# Patient Record
Sex: Female | Born: 1975
Health system: Southern US, Community
[De-identification: ages and names within clinical notes are randomized; demographics above are authoritative.]

## PROBLEM LIST (undated history)

## (undated) DIAGNOSIS — F909 Attention-deficit hyperactivity disorder, unspecified type: Secondary | ICD-10-CM

## (undated) DIAGNOSIS — J45909 Unspecified asthma, uncomplicated: Secondary | ICD-10-CM

## (undated) DIAGNOSIS — K219 Gastro-esophageal reflux disease without esophagitis: Secondary | ICD-10-CM

## (undated) DIAGNOSIS — F319 Bipolar disorder, unspecified: Secondary | ICD-10-CM

## (undated) HISTORY — PX: HERNIA REPAIR: SHX51

## (undated) HISTORY — DX: Attention-deficit hyperactivity disorder, unspecified type: F90.9

## (undated) HISTORY — PX: BACK SURGERY: SHX140

## (undated) HISTORY — PX: ABDOMINAL SURGERY: SHX537

## (undated) HISTORY — PX: INCONTINENCE SURGERY: SHX676

## (undated) HISTORY — PX: OTHER SURGICAL HISTORY: SHX169

## (undated) HISTORY — PX: TUBAL LIGATION: SHX77

## (undated) HISTORY — PX: ABDOMINAL HYSTERECTOMY: SHX81

---

## 2005-04-13 ENCOUNTER — Ambulatory Visit: Payer: Self-pay | Admitting: Unknown Physician Specialty

## 2005-06-04 ENCOUNTER — Emergency Department: Payer: Self-pay | Admitting: Emergency Medicine

## 2005-06-08 ENCOUNTER — Ambulatory Visit: Payer: Self-pay | Admitting: Unknown Physician Specialty

## 2005-09-22 ENCOUNTER — Ambulatory Visit: Payer: Self-pay | Admitting: Otolaryngology

## 2005-12-03 ENCOUNTER — Emergency Department: Payer: Self-pay | Admitting: Emergency Medicine

## 2005-12-22 ENCOUNTER — Ambulatory Visit: Payer: Self-pay | Admitting: Surgery

## 2006-10-23 ENCOUNTER — Emergency Department: Payer: Self-pay | Admitting: Emergency Medicine

## 2007-05-20 ENCOUNTER — Emergency Department: Payer: Self-pay | Admitting: Emergency Medicine

## 2007-09-16 ENCOUNTER — Ambulatory Visit: Payer: Self-pay | Admitting: Unknown Physician Specialty

## 2008-06-03 ENCOUNTER — Ambulatory Visit: Payer: Self-pay | Admitting: Family Medicine

## 2009-02-04 ENCOUNTER — Ambulatory Visit: Payer: Self-pay | Admitting: Internal Medicine

## 2009-05-21 ENCOUNTER — Ambulatory Visit: Payer: Self-pay | Admitting: Unknown Physician Specialty

## 2009-05-26 ENCOUNTER — Ambulatory Visit: Payer: Self-pay | Admitting: Unknown Physician Specialty

## 2009-05-28 ENCOUNTER — Observation Stay: Payer: Self-pay | Admitting: Internal Medicine

## 2009-07-09 ENCOUNTER — Ambulatory Visit: Payer: Self-pay | Admitting: Unknown Physician Specialty

## 2009-08-03 ENCOUNTER — Emergency Department: Payer: Self-pay | Admitting: Emergency Medicine

## 2009-08-15 ENCOUNTER — Ambulatory Visit: Payer: Self-pay | Admitting: Internal Medicine

## 2009-09-26 ENCOUNTER — Emergency Department: Payer: Self-pay | Admitting: Internal Medicine

## 2009-10-31 ENCOUNTER — Emergency Department: Payer: Self-pay | Admitting: Emergency Medicine

## 2009-11-04 ENCOUNTER — Ambulatory Visit: Payer: Self-pay | Admitting: Unknown Physician Specialty

## 2009-12-13 ENCOUNTER — Emergency Department: Payer: Self-pay | Admitting: Emergency Medicine

## 2010-01-09 ENCOUNTER — Emergency Department: Payer: Self-pay | Admitting: Emergency Medicine

## 2010-02-02 ENCOUNTER — Emergency Department: Payer: Self-pay | Admitting: Emergency Medicine

## 2010-04-12 ENCOUNTER — Observation Stay: Payer: Self-pay | Admitting: Internal Medicine

## 2010-06-18 ENCOUNTER — Emergency Department: Payer: Self-pay | Admitting: Emergency Medicine

## 2010-07-25 ENCOUNTER — Emergency Department: Payer: Self-pay | Admitting: Emergency Medicine

## 2010-07-27 ENCOUNTER — Ambulatory Visit: Payer: Self-pay | Admitting: Unknown Physician Specialty

## 2010-08-21 ENCOUNTER — Ambulatory Visit: Payer: Self-pay | Admitting: Internal Medicine

## 2010-10-25 ENCOUNTER — Emergency Department: Payer: Self-pay | Admitting: Emergency Medicine

## 2010-10-29 ENCOUNTER — Emergency Department: Payer: Self-pay | Admitting: Emergency Medicine

## 2010-11-01 ENCOUNTER — Ambulatory Visit: Payer: Self-pay | Admitting: Neurosurgery

## 2010-11-08 ENCOUNTER — Ambulatory Visit: Payer: Self-pay | Admitting: Internal Medicine

## 2011-04-26 ENCOUNTER — Emergency Department: Payer: Self-pay | Admitting: Emergency Medicine

## 2011-06-14 ENCOUNTER — Ambulatory Visit: Payer: Self-pay | Admitting: Family Medicine

## 2011-07-04 ENCOUNTER — Emergency Department: Payer: Self-pay | Admitting: Emergency Medicine

## 2011-08-27 ENCOUNTER — Emergency Department: Payer: Self-pay | Admitting: *Deleted

## 2011-10-07 ENCOUNTER — Emergency Department: Payer: Self-pay | Admitting: Internal Medicine

## 2011-10-07 LAB — URINALYSIS, COMPLETE
Blood: NEGATIVE
Ph: 5 (ref 4.5–8.0)
Protein: 100
Specific Gravity: 1.031 (ref 1.003–1.030)

## 2011-10-07 LAB — BASIC METABOLIC PANEL
Anion Gap: 7 (ref 7–16)
BUN: 8 mg/dL (ref 7–18)
Chloride: 104 mmol/L (ref 98–107)
Creatinine: 0.63 mg/dL (ref 0.60–1.30)
EGFR (Non-African Amer.): >60
Glucose: 85 mg/dL (ref 65–99)
Potassium: 3.3 mmol/L — ABNORMAL LOW (ref 3.5–5.1)

## 2011-10-07 LAB — CBC WITH DIFFERENTIAL/PLATELET
Basophil %: 0.6 %
Eosinophil #: 0.5 10*3/uL (ref 0.0–0.7)
HCT: 33.3 % — ABNORMAL LOW (ref 35.0–47.0)
HGB: 11.3 g/dL — ABNORMAL LOW (ref 12.0–16.0)
MCH: 28.8 pg (ref 26.0–34.0)
MCHC: 33.8 g/dL (ref 32.0–36.0)
Monocyte #: 0.4 10*3/uL (ref 0.0–0.7)
Neutrophil #: 3.6 10*3/uL (ref 1.4–6.5)

## 2011-10-15 ENCOUNTER — Emergency Department: Payer: Self-pay | Admitting: Emergency Medicine

## 2011-10-15 LAB — CBC
MCH: 28.7 pg (ref 26.0–34.0)
MCHC: 33.6 g/dL (ref 32.0–36.0)
MCV: 85 fL (ref 80–100)
Platelet: 324 10*3/uL (ref 150–440)
RBC: 4.24 10*6/uL (ref 3.80–5.20)
RDW: 15.4 % — ABNORMAL HIGH (ref 11.5–14.5)
WBC: 9.5 10*3/uL (ref 3.6–11.0)

## 2011-10-15 LAB — COMPREHENSIVE METABOLIC PANEL
Anion Gap: 9 (ref 7–16)
Bilirubin,Total: 0.1 mg/dL — ABNORMAL LOW (ref 0.2–1.0)
Chloride: 108 mmol/L — ABNORMAL HIGH (ref 98–107)
Co2: 25 mmol/L (ref 21–32)
Creatinine: 0.77 mg/dL (ref 0.60–1.30)
EGFR (African American): >60
EGFR (Non-African Amer.): >60
Osmolality: 281 (ref 275–301)
Potassium: 4.2 mmol/L (ref 3.5–5.1)
Sodium: 142 mmol/L (ref 136–145)
Total Protein: 7.2 g/dL (ref 6.4–8.2)

## 2011-10-15 LAB — URINALYSIS, COMPLETE
Bilirubin,UR: NEGATIVE
Glucose,UR: NEGATIVE mg/dL (ref 0–75)
Ketone: NEGATIVE
Protein: NEGATIVE
RBC,UR: 5 /HPF (ref 0–5)
WBC UR: 374 /HPF (ref 0–5)

## 2011-10-17 LAB — URINE CULTURE

## 2011-11-13 ENCOUNTER — Emergency Department: Payer: Self-pay | Admitting: Emergency Medicine

## 2011-11-13 LAB — CBC
HGB: 13.6 g/dL (ref 12.0–16.0)
MCH: 29 pg (ref 26.0–34.0)
MCHC: 34.1 g/dL (ref 32.0–36.0)
RDW: 16.2 % — ABNORMAL HIGH (ref 11.5–14.5)

## 2011-11-13 LAB — BASIC METABOLIC PANEL
Calcium, Total: 9.2 mg/dL (ref 8.5–10.1)
EGFR (African American): >60
EGFR (Non-African Amer.): >60
Glucose: 118 mg/dL — ABNORMAL HIGH (ref 65–99)
Osmolality: 288 (ref 275–301)
Sodium: 145 mmol/L (ref 136–145)

## 2011-11-13 LAB — URINALYSIS, COMPLETE
Leukocyte Esterase: NEGATIVE
Nitrite: NEGATIVE
Ph: 6 (ref 4.5–8.0)
RBC,UR: 12 /HPF (ref 0–5)
Squamous Epithelial: 1

## 2011-11-15 LAB — URINE CULTURE

## 2012-02-27 ENCOUNTER — Emergency Department: Payer: Self-pay | Admitting: Emergency Medicine

## 2012-02-27 LAB — COMPREHENSIVE METABOLIC PANEL
Bilirubin,Total: 0.2 mg/dL (ref 0.2–1.0)
Chloride: 106 mmol/L (ref 98–107)
EGFR (African American): >60
Osmolality: 277 (ref 275–301)
SGOT(AST): 12 U/L — ABNORMAL LOW (ref 15–37)
SGPT (ALT): 21 U/L
Sodium: 139 mmol/L (ref 136–145)

## 2012-02-27 LAB — URINALYSIS, COMPLETE
Bilirubin,UR: NEGATIVE
Blood: NEGATIVE
Hyaline Cast: 1
Leukocyte Esterase: NEGATIVE
Nitrite: NEGATIVE
Ph: 5 (ref 4.5–8.0)
Protein: 100
RBC,UR: 4 /HPF (ref 0–5)
Squamous Epithelial: 1

## 2012-02-27 LAB — CBC
HGB: 12.7 g/dL (ref 12.0–16.0)
MCH: 28.9 pg (ref 26.0–34.0)
MCHC: 33 g/dL (ref 32.0–36.0)
RDW: 14.2 % (ref 11.5–14.5)
WBC: 8.8 10*3/uL (ref 3.6–11.0)

## 2012-02-27 LAB — LIPASE, BLOOD: Lipase: 173 U/L (ref 73–393)

## 2012-02-28 ENCOUNTER — Encounter (HOSPITAL_COMMUNITY): Payer: Self-pay | Admitting: Emergency Medicine

## 2012-02-28 ENCOUNTER — Emergency Department (HOSPITAL_COMMUNITY)
Admission: EM | Admit: 2012-02-28 | Discharge: 2012-02-29 | Disposition: A | Discharge status: Home / Self Care | Payer: BC Managed Care – PPO | Attending: Emergency Medicine | Admitting: Emergency Medicine

## 2012-02-28 DIAGNOSIS — J45909 Unspecified asthma, uncomplicated: Secondary | ICD-10-CM | POA: Insufficient documentation

## 2012-02-28 DIAGNOSIS — F319 Bipolar disorder, unspecified: Secondary | ICD-10-CM | POA: Insufficient documentation

## 2012-02-28 DIAGNOSIS — K219 Gastro-esophageal reflux disease without esophagitis: Secondary | ICD-10-CM | POA: Insufficient documentation

## 2012-02-28 DIAGNOSIS — R1011 Right upper quadrant pain: Secondary | ICD-10-CM | POA: Insufficient documentation

## 2012-02-28 DIAGNOSIS — Z79899 Other long term (current) drug therapy: Secondary | ICD-10-CM | POA: Insufficient documentation

## 2012-02-28 DIAGNOSIS — E119 Type 2 diabetes mellitus without complications: Secondary | ICD-10-CM | POA: Insufficient documentation

## 2012-02-28 HISTORY — DX: Bipolar disorder, unspecified: F31.9

## 2012-02-28 HISTORY — DX: Gastro-esophageal reflux disease without esophagitis: K21.9

## 2012-02-28 HISTORY — DX: Unspecified asthma, uncomplicated: J45.909

## 2012-02-28 LAB — URINALYSIS, ROUTINE W REFLEX MICROSCOPIC
Glucose, UA: NEGATIVE mg/dL
Leukocytes, UA: NEGATIVE
Nitrite: NEGATIVE
Specific Gravity, Urine: 1.015 (ref 1.005–1.030)
pH: 6.5 (ref 5.0–8.0)

## 2012-02-28 LAB — PREGNANCY, URINE: Preg Test, Ur: NEGATIVE

## 2012-02-28 NOTE — ED Notes (Signed)
Pt c/o RUQ pain radiates to R shoulder. Worse after eating. Pt states this has been going on for 2 weeks. Pt c/o nausea and "burping up stuff that tastes like bile"

## 2012-02-29 ENCOUNTER — Emergency Department (HOSPITAL_COMMUNITY): Payer: BC Managed Care – PPO

## 2012-02-29 LAB — COMPREHENSIVE METABOLIC PANEL
ALT: 14 U/L (ref 0–35)
Alkaline Phosphatase: 82 U/L (ref 39–117)
CO2: 23 mEq/L (ref 19–32)
Chloride: 102 mEq/L (ref 96–112)
GFR calc Af Amer: >90 mL/min (ref >90)
Glucose, Bld: 104 mg/dL — ABNORMAL HIGH (ref 70–99)
Potassium: 3.7 mEq/L (ref 3.5–5.1)
Sodium: 136 mEq/L (ref 135–145)
Total Protein: 6.6 g/dL (ref 6.0–8.3)

## 2012-02-29 LAB — DIFFERENTIAL
Eosinophils Absolute: 0.7 10*3/uL (ref 0.0–0.7)
Lymphocytes Relative: 32 % (ref 12–46)
Lymphs Abs: 2.8 10*3/uL (ref 0.7–4.0)
Neutro Abs: 4.8 10*3/uL (ref 1.7–7.7)
Neutrophils Relative %: 53 % (ref 43–77)

## 2012-02-29 LAB — CBC
Platelets: 286 10*3/uL (ref 150–400)
RBC: 4.12 MIL/uL (ref 3.87–5.11)
WBC: 8.9 10*3/uL (ref 4.0–10.5)

## 2012-02-29 MED ORDER — ONDANSETRON 8 MG PO TBDP: 8.0000 mg | ORAL_TABLET | Freq: Three times a day (TID) | ORAL | Status: AC | PRN

## 2012-02-29 MED ORDER — TRAMADOL HCL 50 MG PO TABS: 50.0000 mg | ORAL_TABLET | Freq: Four times a day (QID) | ORAL | Status: AC | PRN

## 2012-02-29 MED ORDER — MORPHINE SULFATE 4 MG/ML IJ SOLN
4.0000 mg | Freq: Once | INTRAMUSCULAR | Status: AC
  Administered 2012-02-29: 4 mg via INTRAVENOUS
  Filled 2012-02-29: qty 1

## 2012-02-29 MED ORDER — OXYCODONE-ACETAMINOPHEN 5-325 MG PO TABS: 1.0000 | ORAL_TABLET | Freq: Four times a day (QID) | ORAL | Status: DC | PRN

## 2012-02-29 MED ORDER — ONDANSETRON HCL 4 MG/2ML IJ SOLN
4.0000 mg | Freq: Once | INTRAMUSCULAR | Status: AC
  Administered 2012-02-29: 4 mg via INTRAVENOUS
  Filled 2012-02-29: qty 2

## 2012-02-29 NOTE — ED Notes (Signed)
US tech here for abdominal US

## 2012-02-29 NOTE — ED Notes (Signed)
PA Van Wingen at bedside. 

## 2012-02-29 NOTE — ED Provider Notes (Signed)
History     CSN: [id number]  Arrival date & time 02/28/12  2201   First MD Initiated Contact with Patient 02/29/12 0017      Chief Complaint  Patient presents with  . Abdominal Pain    (Consider location/radiation/quality/duration/timing/severity/associated sxs/prior treatment) HPI Comments: Patient reports that she has had intermittent RUQ abdominal pain for the past 2 weeks.  Pain worse today.  Pain becomes worse after eating.  She reports that she is feeling nauseous, but no vomiting.  No fever or chills.    Patient is a 36 y.o. female presenting with abdominal pain. The history is provided by the patient.  Abdominal Pain The primary symptoms of the illness include abdominal pain and nausea. The primary symptoms of the illness do not include fever, shortness of breath, vomiting, diarrhea, dysuria or vaginal discharge.  The patient states that she believes she is currently not pregnant. The patient has not had a change in bowel habit. Symptoms associated with the illness do not include chills, diaphoresis, constipation, urgency, hematuria, frequency or back pain. Significant associated medical issues include gallstones. Significant associated medical issues do not include PUD or GERD.    Past Medical History  Diagnosis Date  . Diabetes mellitus   . Bipolar 1 disorder   . Asthma   . GERD (gastroesophageal reflux disease)     Past Surgical History  Procedure Date  . Abdominal hysterectomy   . Abdominal surgery   . Tubal ligation   . Hernia repair   . Incontinence surgery   . Back surgery     No family history on file.  History  Substance Use Topics  . Smoking status: Not on file  . Smokeless tobacco: Not on file  . Alcohol Use:     OB History    Grav Para Term Preterm Abortions TAB SAB Ect Mult Living                  Review of Systems  Constitutional: Negative for fever, chills and diaphoresis.  Respiratory: Negative for shortness of breath.     Cardiovascular: Negative for chest pain.  Gastrointestinal: Positive for nausea and abdominal pain. Negative for vomiting, diarrhea and constipation.  Genitourinary: Negative for dysuria, urgency, frequency, hematuria and vaginal discharge.  Musculoskeletal: Negative for back pain.  Neurological: Negative for dizziness, syncope and light-headedness.    Allergies  Hydrocodone  Home Medications   Current Outpatient Rx  Name Route Sig Dispense Refill  . ALBUTEROL SULFATE HFA 108 (90 BASE) MCG/ACT IN AERS Inhalation Inhale 2 puffs into the lungs every 6 (six) hours as needed. For ashtma    . AMPHETAMINE-DEXTROAMPHET ER 20 MG PO CP24 Oral Take 20-40 mg by mouth every morning. Patient takes 2 in the morning and 1 in the afternoon    . BUDESONIDE-FORMOTEROL FUMARATE 80-4.5 MCG/ACT IN AERO Inhalation Inhale 2 puffs into the lungs 2 (two) times daily.    Marland Kitchen FLUOXETINE HCL 20 MG PO CAPS Oral Take 60 mg by mouth daily.    Marland Kitchen METFORMIN HCL 500 MG PO TABS Oral Take 500 mg by mouth 2 (two) times daily with a meal.    . OLANZAPINE 20 MG PO TABS Oral Take 20 mg by mouth at bedtime.    . OMEPRAZOLE 20 MG PO CPDR Oral Take 20 mg by mouth daily.    Marland Kitchen PREGABALIN 50 MG PO CAPS Oral Take 50 mg by mouth 3 (three) times daily.    Marland Kitchen ZOLPIDEM TARTRATE 10 MG PO TABS  Oral Take 10 mg by mouth at bedtime.      BP 125/77  Pulse 64  Temp(Src) 98.7 F (37.1 C) (Oral)  Resp 18  SpO2 100%  Physical Exam  Nursing note and vitals reviewed. Constitutional: She appears well-developed and well-nourished. No distress.  HENT:  Head: Normocephalic and atraumatic.  Mouth/Throat: Oropharynx is clear and moist.  Neck: Normal range of motion. Neck supple.  Cardiovascular: Normal rate, regular rhythm and normal heart sounds.   Pulmonary/Chest: Effort normal and breath sounds normal.  Abdominal: Soft. Normal appearance and bowel sounds are normal. She exhibits no ascites and no mass. There is tenderness in the right upper  quadrant. There is no rigidity, no rebound, no guarding, no CVA tenderness, no tenderness at McBurney's point and negative Murphy's sign.  Neurological: She is alert.  Skin: Skin is warm and dry. She is not diaphoretic.  Psychiatric: She has a normal mood and affect.    ED Course  Procedures (including critical care time)  Labs Reviewed  COMPREHENSIVE METABOLIC PANEL - Abnormal; Notable for the following:    Glucose, Bld 104 (*)    Albumin 3.3 (*)    Total Bilirubin 0.1 (*)    All other components within normal limits  URINALYSIS, ROUTINE W REFLEX MICROSCOPIC  PREGNANCY, URINE  LIPASE, BLOOD  CBC  DIFFERENTIAL   US Abdomen Complete  02/29/2012  *RADIOLOGY REPORT*  Clinical Data:  Right upper quadrant abdominal pain.  COMPLETE ABDOMINAL ULTRASOUND  Comparison:  None.  Findings:  Gallbladder:  No gallstones, gallbladder wall thickening, or pericholecystic fluid.  Common bile duct:  Normal in caliber measuring a maximum of 2.35mm.  Liver:  The liver is sonographically unremarkable.  There is normal echogenicity without focal lesions or intrahepatic biliary dilatation.  IVC:  Normal caliber.  Pancreas:  Sonographically normal.  Spleen:  Normal size and echogenicity.  Right Kidney:  11.8 cm in length. Normal renal cortical thickness and echogenicity without focal lesions or hydronephrosis.  Left Kidney:  11.6 cm in length. Normal renal cortical thickness and echogenicity without focal lesions or hydronephrosis.  Abdominal aorta:  Normal caliber.  IMPRESSION: Unremarkable abdominal ultrasound examination.  Original Report Authenticated By: P. Loralie Champagne, M.D.     No diagnosis found.  2:58 AM Reassessed patient.  She reports that her pain has improved at this time.  MDM  Patient with history of gallstones presenting with intermittent RUQ abdominal pain over the past 2 weeks.  Labs unremarkable.  Patient afebrile.  Abdominal ultrasound negative.  Symptoms improved while in the ED.  No  vomiting.  Return precautions discussed.          Pascal Lux Colo, PA-C 03/03/12 2227

## 2012-02-29 NOTE — Discharge Instructions (Signed)
Your ultrasound today did not show any gallstones or inflammation of the gallbladder.  The ultrasound was normal.  Follow up with the Gastroenterologist listed above for further evaluation of your abdominal pain. Only use your pain medication for severe pain. Do not operate heavy machinery while on pain medication or muscle relaxer.    Abdominal Pain  Your exam might not show the exact reason you have abdominal pain. Since there are many different causes of abdominal pain, another checkup and more tests may be needed. It is very important to follow up for lasting (persistent) or worsening symptoms. A possible cause of abdominal pain in any person who still has his or her appendix is acute appendicitis. Appendicitis is often hard to diagnose. Normal blood tests, urine tests, ultrasound, and CT scans do not completely rule out early appendicitis or other causes of abdominal pain. Sometimes, only the changes that happen over time will allow appendicitis and other causes of abdominal pain to be determined. Other potential problems that may require surgery may also take time to become more apparent. Because of this, it is important that you follow all of the instructions below.   HOME CARE INSTRUCTIONS  Do not take laxatives unless directed by your caregiver. Rest as much as possible.  Do not eat solid food until your pain is gone: A diet of water, weak decaffeinated tea, broth or bouillon, gelatin, oral rehydration solutions (ORS), frozen ice pops, or ice chips may be helpful.  When pain is gone: Start a light diet (dry toast, crackers, applesauce, or white rice). Increase the diet slowly as long as it does not bother you. Eat no dairy products (including cheese and eggs) and no spicy, fatty, fried, or high-fiber foods.  Use no alcohol, caffeine, or cigarettes.  Take your regular medicines unless your caregiver told you not to.  Take any prescribed medicine as directed.   SEEK IMMEDIATE MEDICAL CARE IF:    The pain does not go away.  You have a fever >101 that persists You keep throwing up (vomiting) or cannot drink liquids.  The pain becomes localized (Pain in the right side could possibly be appendicitis. In an adult, pain in the left lower portion of the abdomen could be colitis or diverticulitis). You pass bloody or black tarry stools.  You have shaking chills.  There is blood in your vomit or you see blood in your bowel movements.  Your bowel movements stop (become blocked) or you cannot pass gas.  You have bloody, frequent, or painful urination.  You have yellow discoloration in the skin or whites of the eyes.  Your stomach becomes bloated or bigger.  You have dizziness or fainting.  You have chest or back pain.

## 2012-03-05 NOTE — ED Provider Notes (Signed)
Medical screening examination/treatment/procedure(s) were performed by non-physician practitioner and as supervising physician I was immediately available for consultation/collaboration.   Zeina Akkerman, MD 03/05/12 0037 

## 2012-05-26 ENCOUNTER — Observation Stay: Payer: Self-pay | Admitting: Internal Medicine

## 2012-05-26 LAB — CK TOTAL AND CKMB (NOT AT ARMC): CK-MB: <0.5 ng/mL — ABNORMAL LOW (ref 0.5–3.6)

## 2012-05-26 LAB — DRUG SCREEN, URINE
Cannabinoid 50 Ng, Ur ~~LOC~~: NEGATIVE (ref ?–50)
Opiate, Ur Screen: NEGATIVE (ref ?–300)
Phencyclidine (PCP) Ur S: NEGATIVE (ref ?–25)

## 2012-05-26 LAB — COMPREHENSIVE METABOLIC PANEL
Albumin: 3.4 g/dL (ref 3.4–5.0)
BUN: 10 mg/dL (ref 7–18)
Chloride: 105 mmol/L (ref 98–107)
Co2: 26 mmol/L (ref 21–32)
Creatinine: 0.75 mg/dL (ref 0.60–1.30)
EGFR (African American): >60
Potassium: 3.7 mmol/L (ref 3.5–5.1)
SGOT(AST): 17 U/L (ref 15–37)
SGPT (ALT): 19 U/L (ref 12–78)
Total Protein: 7.1 g/dL (ref 6.4–8.2)

## 2012-05-26 LAB — URINALYSIS, COMPLETE
Bilirubin,UR: NEGATIVE
Ketone: NEGATIVE
Ph: 5 (ref 4.5–8.0)
Protein: 30
RBC,UR: 3 /HPF (ref 0–5)
Specific Gravity: 1.01 (ref 1.003–1.030)
Squamous Epithelial: <1

## 2012-05-26 LAB — ETHANOL: Ethanol %: <0.003 % (ref 0.000–0.080)

## 2012-05-26 LAB — TROPONIN I
Troponin-I: <0.02 ng/mL
Troponin-I: <0.02 ng/mL

## 2012-05-26 LAB — CBC
MCHC: 34.4 g/dL (ref 32.0–36.0)
RDW: 13.5 % (ref 11.5–14.5)
WBC: 15.3 10*3/uL — ABNORMAL HIGH (ref 3.6–11.0)

## 2012-08-09 ENCOUNTER — Emergency Department: Payer: Self-pay | Admitting: Emergency Medicine

## 2012-12-07 ENCOUNTER — Inpatient Hospital Stay: Payer: Self-pay | Admitting: Psychiatry

## 2012-12-07 LAB — DRUG SCREEN, URINE
Amphetamines, Ur Screen: POSITIVE (ref ?–1000)
Benzodiazepine, Ur Scrn: NEGATIVE (ref ?–200)
Cannabinoid 50 Ng, Ur ~~LOC~~: NEGATIVE (ref ?–50)
Cocaine Metabolite,Ur ~~LOC~~: NEGATIVE (ref ?–300)
MDMA (Ecstasy)Ur Screen: NEGATIVE (ref ?–500)
Methadone, Ur Screen: NEGATIVE (ref ?–300)
Opiate, Ur Screen: NEGATIVE (ref ?–300)
Tricyclic, Ur Screen: NEGATIVE (ref ?–1000)

## 2012-12-07 LAB — COMPREHENSIVE METABOLIC PANEL
Albumin: 3.6 g/dL (ref 3.4–5.0)
BUN: 11 mg/dL (ref 7–18)
Bilirubin,Total: 0.4 mg/dL (ref 0.2–1.0)
Calcium, Total: 8.6 mg/dL (ref 8.5–10.1)
Chloride: 105 mmol/L (ref 98–107)
Co2: 23 mmol/L (ref 21–32)
Creatinine: 0.75 mg/dL (ref 0.60–1.30)
EGFR (African American): >60
EGFR (Non-African Amer.): >60
SGOT(AST): 21 U/L (ref 15–37)
SGPT (ALT): 20 U/L (ref 12–78)
Total Protein: 7.8 g/dL (ref 6.4–8.2)

## 2012-12-07 LAB — CBC
HGB: 12.4 g/dL (ref 12.0–16.0)
MCH: 27.6 pg (ref 26.0–34.0)
MCV: 84 fL (ref 80–100)
RDW: 14.7 % — ABNORMAL HIGH (ref 11.5–14.5)
WBC: 15.4 10*3/uL — ABNORMAL HIGH (ref 3.6–11.0)

## 2012-12-07 LAB — URINALYSIS, COMPLETE
Blood: NEGATIVE
Glucose,UR: NEGATIVE mg/dL (ref 0–75)
Nitrite: NEGATIVE
Ph: 5 (ref 4.5–8.0)
Protein: 100
RBC,UR: 16 /HPF (ref 0–5)
WBC UR: 3 /HPF (ref 0–5)

## 2012-12-07 LAB — ETHANOL: Ethanol: <3 mg/dL

## 2013-01-08 ENCOUNTER — Emergency Department: Payer: Self-pay | Admitting: Emergency Medicine

## 2013-01-14 ENCOUNTER — Inpatient Hospital Stay: Payer: Self-pay | Admitting: Internal Medicine

## 2013-01-14 LAB — COMPREHENSIVE METABOLIC PANEL
Albumin: 2.9 g/dL — ABNORMAL LOW (ref 3.4–5.0)
Anion Gap: 6 — ABNORMAL LOW (ref 7–16)
Calcium, Total: 8.2 mg/dL — ABNORMAL LOW (ref 8.5–10.1)
Creatinine: 0.61 mg/dL (ref 0.60–1.30)
EGFR (Non-African Amer.): >60
Osmolality: 274 (ref 275–301)
Potassium: 3.6 mmol/L (ref 3.5–5.1)
SGPT (ALT): 22 U/L (ref 12–78)
Sodium: 137 mmol/L (ref 136–145)

## 2013-01-14 LAB — ETHANOL: Ethanol: <3 mg/dL

## 2013-01-14 LAB — DRUG SCREEN, URINE
Amphetamines, Ur Screen: NEGATIVE (ref ?–1000)
Benzodiazepine, Ur Scrn: POSITIVE (ref ?–200)
Cannabinoid 50 Ng, Ur ~~LOC~~: NEGATIVE (ref ?–50)
Cocaine Metabolite,Ur ~~LOC~~: NEGATIVE (ref ?–300)
Methadone, Ur Screen: NEGATIVE (ref ?–300)
Tricyclic, Ur Screen: NEGATIVE (ref ?–1000)

## 2013-01-14 LAB — CBC
HCT: 33.3 % — ABNORMAL LOW (ref 35.0–47.0)
HGB: 10.9 g/dL — ABNORMAL LOW (ref 12.0–16.0)
MCH: 27.5 pg (ref 26.0–34.0)
MCHC: 32.7 g/dL (ref 32.0–36.0)
MCV: 84 fL (ref 80–100)
Platelet: 233 10*3/uL (ref 150–440)
RBC: 3.96 10*6/uL (ref 3.80–5.20)

## 2013-01-14 LAB — URINALYSIS, COMPLETE
Glucose,UR: NEGATIVE mg/dL (ref 0–75)
Protein: 100
Specific Gravity: 1.019 (ref 1.003–1.030)
Squamous Epithelial: <1

## 2013-01-14 LAB — SALICYLATE LEVEL: Salicylates, Serum: 3.1 mg/dL — ABNORMAL HIGH

## 2013-01-14 LAB — ACETAMINOPHEN LEVEL: Acetaminophen: <2 ug/mL

## 2013-01-15 ENCOUNTER — Inpatient Hospital Stay: Payer: Self-pay | Admitting: Psychiatry

## 2013-01-15 LAB — CBC WITH DIFFERENTIAL/PLATELET
Basophil #: 0 10*3/uL (ref 0.0–0.1)
Basophil %: 0.1 %
Eosinophil %: 3.1 %
HCT: 34.2 % — ABNORMAL LOW (ref 35.0–47.0)
Lymphocyte #: 1.7 10*3/uL (ref 1.0–3.6)
MCH: 27.9 pg (ref 26.0–34.0)
Monocyte #: 0.5 x10 3/mm (ref 0.2–0.9)
Monocyte %: 4.5 %
Neutrophil %: 78.6 %
RBC: 4.02 10*6/uL (ref 3.80–5.20)
RDW: 15.4 % — ABNORMAL HIGH (ref 11.5–14.5)
WBC: 12.1 10*3/uL — ABNORMAL HIGH (ref 3.6–11.0)

## 2013-01-15 LAB — COMPREHENSIVE METABOLIC PANEL
Albumin: 2.9 g/dL — ABNORMAL LOW (ref 3.4–5.0)
Alkaline Phosphatase: 75 U/L (ref 50–136)
BUN: 6 mg/dL — ABNORMAL LOW (ref 7–18)
Bilirubin,Total: 0.3 mg/dL (ref 0.2–1.0)
Co2: 24 mmol/L (ref 21–32)
Creatinine: 0.59 mg/dL — ABNORMAL LOW (ref 0.60–1.30)
EGFR (African American): >60
EGFR (Non-African Amer.): >60
Glucose: 140 mg/dL — ABNORMAL HIGH (ref 65–99)
Osmolality: 279 (ref 275–301)
Potassium: 3.9 mmol/L (ref 3.5–5.1)
SGOT(AST): 16 U/L (ref 15–37)
Sodium: 140 mmol/L (ref 136–145)
Total Protein: 6.2 g/dL — ABNORMAL LOW (ref 6.4–8.2)

## 2013-01-15 LAB — AMYLASE: Amylase: 40 U/L (ref 25–115)

## 2013-01-15 LAB — LIPID PANEL
Cholesterol: 200 mg/dL (ref 0–200)
HDL Cholesterol: 58 mg/dL (ref 40–60)
Ldl Cholesterol, Calc: 129 mg/dL — ABNORMAL HIGH (ref 0–100)
Triglycerides: 65 mg/dL (ref 0–200)

## 2013-01-15 LAB — HEPATIC FUNCTION PANEL A (ARMC)
Bilirubin, Direct: <0.05 mg/dL (ref 0.00–0.20)
SGOT(AST): 10 U/L — ABNORMAL LOW (ref 15–37)
SGPT (ALT): 22 U/L (ref 12–78)
Total Protein: 6.2 g/dL — ABNORMAL LOW (ref 6.4–8.2)

## 2013-01-15 LAB — ACETAMINOPHEN LEVEL: Acetaminophen: <2 ug/mL

## 2013-01-15 LAB — MAGNESIUM: Magnesium: 1.5 mg/dL — ABNORMAL LOW

## 2013-01-15 LAB — PROTIME-INR: INR: 1

## 2013-02-18 ENCOUNTER — Emergency Department: Payer: Self-pay | Admitting: Emergency Medicine

## 2013-02-18 LAB — BASIC METABOLIC PANEL
Calcium, Total: 8.9 mg/dL (ref 8.5–10.1)
EGFR (Non-African Amer.): 60 — ABNORMAL LOW
Potassium: 3.9 mmol/L (ref 3.5–5.1)
Sodium: 135 mmol/L — ABNORMAL LOW (ref 136–145)

## 2013-02-18 LAB — CBC
HCT: 32.5 % — ABNORMAL LOW (ref 35.0–47.0)
MCHC: 34.5 g/dL (ref 32.0–36.0)
MCV: 83 fL (ref 80–100)
RBC: 3.91 10*6/uL (ref 3.80–5.20)
RDW: 15.7 % — ABNORMAL HIGH (ref 11.5–14.5)

## 2013-05-28 LAB — CBC
MCH: 28.9 pg (ref 26.0–34.0)
MCV: 84 fL (ref 80–100)
RBC: 4.08 10*6/uL (ref 3.80–5.20)
WBC: 8.8 10*3/uL (ref 3.6–11.0)

## 2013-05-28 LAB — COMPREHENSIVE METABOLIC PANEL
Albumin: 3.2 g/dL — ABNORMAL LOW (ref 3.4–5.0)
Alkaline Phosphatase: 82 U/L (ref 50–136)
Anion Gap: 8 (ref 7–16)
Calcium, Total: 8.8 mg/dL (ref 8.5–10.1)
Co2: 21 mmol/L (ref 21–32)
Creatinine: 0.55 mg/dL — ABNORMAL LOW (ref 0.60–1.30)
EGFR (African American): >60
EGFR (Non-African Amer.): >60
SGOT(AST): 17 U/L (ref 15–37)
Total Protein: 6.9 g/dL (ref 6.4–8.2)

## 2013-05-28 LAB — URINALYSIS, COMPLETE
Bilirubin,UR: NEGATIVE
Blood: NEGATIVE
Glucose,UR: NEGATIVE mg/dL (ref 0–75)
Leukocyte Esterase: NEGATIVE
Nitrite: NEGATIVE
Ph: 5 (ref 4.5–8.0)
Specific Gravity: 1.025 (ref 1.003–1.030)
Squamous Epithelial: <1

## 2013-05-28 LAB — DRUG SCREEN, URINE
Amphetamines, Ur Screen: NEGATIVE (ref ?–1000)
Barbiturates, Ur Screen: NEGATIVE (ref ?–200)
Benzodiazepine, Ur Scrn: POSITIVE (ref ?–200)
Cannabinoid 50 Ng, Ur ~~LOC~~: NEGATIVE (ref ?–50)
MDMA (Ecstasy)Ur Screen: NEGATIVE (ref ?–500)
Opiate, Ur Screen: NEGATIVE (ref ?–300)
Phencyclidine (PCP) Ur S: NEGATIVE (ref ?–25)

## 2013-05-28 LAB — ACETAMINOPHEN LEVEL: Acetaminophen: <2 ug/mL

## 2013-05-28 LAB — SALICYLATE LEVEL: Salicylates, Serum: 3.1 mg/dL — ABNORMAL HIGH

## 2013-05-28 LAB — ETHANOL
Ethanol %: <0.003 % (ref 0.000–0.080)
Ethanol: <3 mg/dL

## 2013-05-29 ENCOUNTER — Inpatient Hospital Stay: Payer: Self-pay | Admitting: Psychiatry

## 2013-06-13 ENCOUNTER — Ambulatory Visit: Payer: Self-pay | Admitting: Orthopedic Surgery

## 2014-04-23 DIAGNOSIS — E119 Type 2 diabetes mellitus without complications: Secondary | ICD-10-CM | POA: Insufficient documentation

## 2014-04-23 DIAGNOSIS — J45909 Unspecified asthma, uncomplicated: Secondary | ICD-10-CM | POA: Insufficient documentation

## 2014-04-23 DIAGNOSIS — F319 Bipolar disorder, unspecified: Secondary | ICD-10-CM | POA: Insufficient documentation

## 2014-05-23 ENCOUNTER — Emergency Department: Payer: Self-pay | Admitting: Emergency Medicine

## 2014-05-23 LAB — BASIC METABOLIC PANEL
Anion Gap: 7 (ref 7–16)
BUN: 14 mg/dL (ref 7–18)
CALCIUM: 8.7 mg/dL (ref 8.5–10.1)
CO2: 28 mmol/L (ref 21–32)
CREATININE: 0.78 mg/dL (ref 0.60–1.30)
Chloride: 101 mmol/L (ref 98–107)
EGFR (Non-African Amer.): >60
Glucose: 136 mg/dL — ABNORMAL HIGH (ref 65–99)
OSMOLALITY: 275 (ref 275–301)
POTASSIUM: 4.2 mmol/L (ref 3.5–5.1)
SODIUM: 136 mmol/L (ref 136–145)

## 2014-05-23 LAB — URINALYSIS, COMPLETE
Bacteria: NONE SEEN
Bilirubin,UR: NEGATIVE
Blood: NEGATIVE
Glucose,UR: NEGATIVE mg/dL (ref 0–75)
Ketone: NEGATIVE
LEUKOCYTE ESTERASE: NEGATIVE
Nitrite: NEGATIVE
PH: 7 (ref 4.5–8.0)
SPECIFIC GRAVITY: 1.013 (ref 1.003–1.030)
Squamous Epithelial: 1
WBC UR: 3 /HPF (ref 0–5)

## 2014-05-23 LAB — CBC WITH DIFFERENTIAL/PLATELET
BASOS ABS: 0 10*3/uL (ref 0.0–0.1)
Basophil %: 0.2 %
Eosinophil #: 0.1 10*3/uL (ref 0.0–0.7)
Eosinophil %: 0.7 %
HCT: 35.2 % (ref 35.0–47.0)
HGB: 11.6 g/dL — ABNORMAL LOW (ref 12.0–16.0)
Lymphocyte #: 1.1 10*3/uL (ref 1.0–3.6)
Lymphocyte %: 10.3 %
MCH: 29.3 pg (ref 26.0–34.0)
MCHC: 33 g/dL (ref 32.0–36.0)
MCV: 89 fL (ref 80–100)
MONO ABS: 0.6 x10 3/mm (ref 0.2–0.9)
MONOS PCT: 5.6 %
NEUTROS PCT: 83.2 %
Neutrophil #: 9 10*3/uL — ABNORMAL HIGH (ref 1.4–6.5)
PLATELETS: 329 10*3/uL (ref 150–440)
RBC: 3.97 10*6/uL (ref 3.80–5.20)
RDW: 14.4 % (ref 11.5–14.5)
WBC: 10.8 10*3/uL (ref 3.6–11.0)

## 2014-06-10 ENCOUNTER — Emergency Department: Payer: Self-pay | Admitting: Emergency Medicine

## 2014-09-03 ENCOUNTER — Emergency Department: Payer: Self-pay | Admitting: Emergency Medicine

## 2014-10-19 ENCOUNTER — Ambulatory Visit: Payer: Self-pay | Admitting: Nurse Practitioner

## 2014-11-24 ENCOUNTER — Ambulatory Visit: Payer: Self-pay | Admitting: Anesthesiology

## 2015-01-11 ENCOUNTER — Ambulatory Visit
Admit: 2015-01-11 | Disposition: A | Discharge status: Home / Self Care | Payer: Self-pay | Attending: Anesthesiology | Admitting: Anesthesiology

## 2015-01-12 NOTE — H&P (Signed)
PATIENT NAME:  Katrina Booker, Katrina Booker MR#:  [id number] DATE OF BIRTH:  03/20/1976  DATE OF ADMISSION:  05/26/2012  PRIMARY CARE PHYSICIAN:  Dr. Terance HartBronstein. ER PHYSICIAN: Dr. Enedina FinnerGoli.   CHIEF COMPLAINT: Syncope.   HISTORY OF PRESENT ILLNESS: The patient is a 39 year old female with history of bipolar disorder, generalized anxiety, attention deficit/hyperactivity disorder, diabetes mellitus, and history of obstructive sleep apnea who came in because the patient had collapsed at a bar. The patient went to the bar around 8:30 or 9:00 and she had one drink of tequila and after about five minutes she collapsed and brought into the Emergency Room. The patient was found to have some decreased mental status and confusion. According to the patient, during my exam the patient was awake and oriented. She just had one drink. After that she collapsed and she did not lose consciousness, but she felt very dizzy and disoriented. The patient denies any chest pain. No trouble breathing, no cough, no fever. The patient usually takes her medications around bedtime. She did not take any psychiatric medication and after having a drink she felt like this. In the ER x-ray of the chest showed possible aspiration pneumonia and we are going to admit for syncope, altered mental status and aspiration pneumonia. The patient right now denies any chest pain, no trouble breathing. In general, feels a little slow to respond, but did not have any seizure activity. The patient denies any head injury. Complains of severe back pain after the fall. The patient says that her lower back is hurting her. She had a history of back surgeries and then had some back pain on and off, but now it is hurting her more and asking for Dilaudid. The patient denies any neck pain or back pain and denies any other complaints. The patient has no headache.   PAST MEDICAL HISTORY:  1. History of diabetes mellitus, type II.  2. Ongoing tobacco abuse. 3. Bipolar  disorder. 4. Possible obstructive sleep apnea.  MEDICATIONS:  1. Symbicort two puffs Booker.i.d.  2. Adderall 40 mg in the morning, 20 mg at night.  3. Ambien 10 mg at bedtime.  4. Lyrica 50 mg p.o. daily.  5. Ketorolac 10 mg 4 times a day as needed for back pain and headache. 6. Flexeril 10 mg p.o. daily.  7. Oxycodone 15 mg p.o. daily.  8. Prozac 20 mg p.o. daily.  9. Xanax 1 mg p.o. t.i.d.  10. Zyprexa 12 milligrams p.o. daily.  11. Metformin 500 mg p.o. Booker.i.d.  12. Victoza. She takes 30 mg injection daily.   ALLERGIES: Hydrocodone.   SOCIAL HISTORY: She still smokes 1 pack per day and she was studying as a Physicist, medicalfull-time student in psychology.   FAMILY HISTORY: Coronary artery disease, hypertension, diabetes, and hyperlipidemia.   PAST SURGICAL HISTORY:  1. History of abdominal surgery with exploratory laparoscopy. 2. Tubal ligation. 3. Hysterectomy. 4. Facial tumor removal.    5. Facial trauma revision.  6. Bladder sling operation.   REVIEW OF SYSTEMS: CONSTITUTIONAL: Has some fatigue and weakness. EYES: No blurred vision. ENT: No tinnitus. No ear pain. No epistaxis. No difficulty swallowing. RESPIRATORY: No cough. No wheezing. CARDIOVASCULAR: No chest pain. No orthopnea. GASTROINTESTINAL: No nausea. No vomiting. No diarrhea. ENDOCRINE: No polyuria or nocturia. HEMATOLOGIC: No anemia. INTEGUMENTARY: No skin rashes. MUSCULOSKELETAL: Complains of back pain. NEUROLOGIC: No numbness or weakness. PSYCHOLOGICAL: He is slightly anxious.   PHYSICAL EXAMINATION:  VITAL SIGNS: Temperature 99.1, pulse 109, respirations 18, blood pressure 130/85, sats 99% on  room air.   GENERAL: Alert, awake and oriented now.   HEENT: Head atraumatic, normocephalic. Pupils equally reacting to light. Extraocular movements are intact.   ENT: No tympanic membrane congestion. No turbinate hypertrophy. Normal oropharyngeal erythema.   NECK: Normal range of motion. No JVD. No carotid bruit.   CARDIOVASCULAR: S1,  S2, regular, slightly tachycardic.   LUNGS: Clear to auscultation. No wheeze. The patient is not using accessory muscles of respiration.   ABDOMEN: Soft, obese. Bowel sounds present. No hernias.   MUSCULOSKELETAL: Complains of tenderness in the lower back area. No obvious swelling. The patient is somewhat limited because of back pain.   SKIN: No skin rashes.   EXTREMITIES: 1+ edema present.   PSYCH: Oriented to time, place, and person. No focal neurological deficit.   NEUROLOGIC: No focal neurological deficit. Cranial nerves II through XII intact. Power five out of five in upper and lower extremities. Sensations are intact. Deep tendon reflexes 2+ bilaterally.   LABORATORY, DIAGNOSTIC AND RADIOLOGICAL DATA: Urine showed yellow hazy-colored urine, leukocyte esterase negative. Urine toxicology is negative. WBC 15.3, hemoglobin 12.7, hematocrit 36.8 and platelets 275. Electrolytes: Sodium 137, potassium 3.7, chloride 105, bicarbonate 26, BUN 10, creatinine 0.75, glucose 115. Troponin less than 0.02. Alcohol level less than 0.003. EKG: Sinus tachycardia with one or three beats per minute. X-ray of the chest showed possible aspiration pneumonia. Official report is still pending. CT of the head showed no acute intracranial abnormality.   ASSESSMENT AND PLAN:  1. This patient is a 40 year old female with multiple medical problems of anxiety, bipolar disorder, attention deficit/hyperactivity disorder, diabetes mellitus, type 2, has history of previous pneumonia, who came in with altered mental status and syncope. AMS is likely secondary to alcohol and also she is on Xanax so a combination of things might have contributed to her altered mental status. Right now, she is alert, awake, oriented and complains of back pain. CT of the head is negative. She has possible aspiration pneumonia with being altered and the patient is going to be admitted for overnight observation. CT of the head is negative. Will check  orthostatic vitals and for aspiration pneumonia, she is also on Zosyn and Zithromax. Urine toxicology is negative. We will also rule out any cardiac event. So far cardiac markers negative. EKG is negative. For her sinus tachycardia, she is on IV fluids and can give low dose metoprolol and continue her on aspirin and continue antibiotics and follow the cultures.  2. Bipolar disorder and history of generalized anxiety along with attention deficit/hyperactivity disorder. Continue her on Prozac, Adderall and Xanax. We can restart the Xanax tomorrow because she is slightly slow to respond and also start Prozac tomorrow.  3. History of diabetes mellitus type 2. Continue Victoza and metformin.  4. History of chronic obstructive pulmonary disease with active tobacco abuse. Counseled the patient for three minutes against smoking cessation. Continue her on Combivent as needed.   TIME SPENT ON HISTORY AND PHYSICAL: About 60 minutes.   ____________________________ Katha Hamming, MD sk:ap D: 05/26/2012 04:05:48 ET T: 05/26/2012 14:09:36 ET JOB#: [id number]  cc: Katha Hamming, MD, <Dictator> Teena Irani. Terance Hart, MD Katha Hamming MD ELECTRONICALLY SIGNED 06/06/2012 21:13

## 2015-01-12 NOTE — Discharge Summary (Signed)
PATIENT NAME:  Katrina Booker, Katrina B MR#:  [id number] DATE OF BIRTH:  October 02, 1975  DATE OF ADMISSION:  05/26/2012 DATE OF DISCHARGE:  05/26/2012  ADMITTING DIAGNOSES:  1. Syncope.  2. Decrease in responsiveness.   DISCHARGE DIAGNOSES:  1. Decrease in responsiveness/syncope in the setting of multiple sedating medications as well as concurrent alcohol ingestion. No arrhythmias noted.  2. Possible aspiration without evidence of aspiration pneumonia on chest x-ray.  3. Chronic obstructive pulmonary disease.  4. Tobacco abuse.  5. Left ankle pain probably due to ankle sprain, negative x-ray of the ankle.  6. Sinus tachycardia, resolved with IV fluids.  7. Chronic low back pain.   PERTINENT LABORATORY AND EVALUATIONS: Troponin 0.02. CPK 82. Glucose 115, BUN 10, creatinine 0.75, sodium 137, potassium 3.7, chloride 105, CO2 26, calcium 9.0. LFTs were normal.   CT scan the head showed no acute abnormality.   Chest x-ray showed increased density in the perihilar region on the right.   TUDS positive for amphetamines and benzodiazepines. Blood cultures x2 no growth.   Lumbar spine no acute lumbar abnormality.   Left ankle x-ray shows no abnormality.   HOSPITAL COURSE: Please refer to history and physical done by the admitting physician. The patient is a 39 year old female with history of bipolar disorder, generalized anxiety, anxiety deficit, diabetes, and history of obstructive sleep apnea who is on multiple sedating medications. She was brought to the ED after the patient went to a bar around 8:30 or 9 and she had one drink of tequila. Five minutes after she collapsed and was brought to the ED. The patient is again on multiple medications and even that one drink could have interacted with the medications and caused her episode. The patient did not have any arrhythmia. CT scan of the head was negative. She was monitored in the hospital. She complained of having left ankle pain. X-ray of the left ankle  was done which was negative. The patient otherwise was feeling okay and wanted to be discharged home. At this time she is stable for discharge.   DISCHARGE MEDICATIONS:  1. Zofran 4 mg 3 times per day as needed.  2. Ketorolac 10 mg 1 tab p.o. 4 times per day.  3. Albuterol q.4 p.r.n.  4. Ambien 10 mg at bedtime.  5. Prozac 20 daily.  6. Adderall XR 20 mg 1 tab p.o. p.m.  7. Symbicort 2 puffs b.i.d.  8. Lyrica 50 daily.  9. Flexeril 10 daily.  10. Oxycodone 15 mg daily.  11. Zyprexa as taking previously.  12. Xanax 1 mg p.r.n. for anxiety. 13. Percocet 5/325 q.6 p.r.n. pain. 14. Augmentin 875 p.o. q.12 x4 days.  DIET: Carbohydrate consistent diet.   ACTIVITY: As tolerated.   FOLLOW-UP: Follow-up with primary MD in 1 to 2 weeks.   TIME SPENT: 35 minutes.    ____________________________ Lacie ScottsShreyang H. Allena KatzPatel, MD shp:drc D: 05/27/2012 04:54:0909:23:28 ET T: 05/28/2012 13:28:20 ET JOB#: [id number]  cc: Marshay Slates H. Allena KatzPatel, MD, <Dictator> Charise CarwinSHREYANG H Ariella Voit MD ELECTRONICALLY SIGNED 05/31/2012 18:03

## 2015-01-15 NOTE — Consult Note (Signed)
PATIENT NAME:  Katrina Booker, Katrina Booker MR#:  [id number] DATE OF BIRTH:  Mar 03, 1976  DATE OF CONSULTATION:  01/18/2013  REFERRING PHYSICIAN:  Dr. Garnetta Buddy.  CONSULTING PHYSICIAN:  Vivek J. Cherlynn Kaiser, MD  REASON FOR CONSULTATION:  Right foot pain.   HISTORY OF PRESENT ILLNESS:  This is a 39 year old female who was admitted to Behavioral Medicine due to a drug overdose: The patient took significant amounts of Xanax secondary to some depression and bipolar disorder. Hospitalist Services were contacted for right foot pain. The patient says that she has been having pain in the right foot now since this morning. The pain is specifically located right underneath the great toe. In the plantar area. She has pain on dorsiflexion also in extension. There is no redness. There is no induration. There are no masses noted. She did have a fever of when she originally presented to the hospital, although she does not have a fever now. There is no nausea, no vomiting. No abdominal pain. No chest pain, no shortness of breath and no other associated symptoms presently.   REVIEW OF SYSTEMS:  CONSTITUTIONAL:  Undocumented fever. No weight gain, weight loss.  EYES:  No blurry or double vision.  ENT:  No tinnitus. No postnasal drip. No redness of the oropharynx.  RESPIRATORY:  No cough, no wheeze. No hemoptysis or dyspnea.  CARDIOVASCULAR:  No chest pain, no orthopnea, no palpitations, no syncope.  GASTROINTESTINAL:  No nausea, no vomiting, no diarrhea. No abdominal pain, no melena, hematochezia. GENITOURINARY: No dysuria, no hematuria.  ENDOCRINE:  No polyuria or nocturia. No heat or cold intolerance. HEMATOLOGIC:  No anemia, bruising, no bleeding.  INTEGUMENTARY:  No rashes, no lesions.  MUSCULOSKELETAL:  No arthritis. No swelling. No gout noted.  NEUROLOGIC:  No numbness, no tingling, no ataxia. No seizure-type activity.  PSYCHIATRIC:  No anxiety. No insomnia. Positive ADHD and positive depression and bipolar.   PAST  MEDICAL HISTORY:   Consistent with diabetes, bipolar disorder, depression, tobacco abuse, diabetic neuropathy, COPD.  ALLERGIES:  HYDROCODONE WHICH CAUSES HIVES.   SOCIAL HISTORY:  Does smoke about a pack and a half per day, has been smoking for the past 15 to 20 years. Occasional alcohol use. No illicit drug abuse. Lives at home with her husband and kids.   FAMILY HISTORY:  Significant for diabetes and hypertension and heart disease in both mother and father's side of the family, both are alive.   CURRENT MEDICATIONS:  Adderall 20 mg extended release twice daily, albuterol nebulizer 4 times daily as needed, Ambien 10 mg at bedtime, folic acid 0.4 mg daily, Lasix 80 mg daily as needed for swelling, Lyrica 50 mg t.i.d., magnesium oxide 400 mg daily, metformin 500 mg b.i.d., potassium 20 mEq daily as needed, Prilosec 20 mg daily, Prozac 60 mg daily, Symbicort 2 puffs b.i.d., albuterol inhaler 2 puffs q.4 hours as needed, vitamin B6 100 mg daily, vitamin C 1000 mg 2 tabs daily, Xanax 1 mg t.i.d. and Zyprexa 20 mg at bedtime.   PHYSICAL EXAMINATION PRESENTLY IS AS FOLLOWS:  VITAL SIGNS:  Temperature 97.6, pulse 88, respirations 18, blood pressure 122/68.  GENERAL:  She is a pleasant appearing female in no apparent distress.  HEENT:  Atraumatic, normocephalic. Extraocular muscles are intact. Pupils equal and reactive to light. Sclerae anicteric. No conjunctival injection. No pharyngeal erythema.  NECK:  Supple. There is no jugular venous distention, no bruits, no lymphadenopathy or thyromegaly.  HEART:  Regular rate and rhythm. No murmurs, rubs, or clicks.  LUNGS:  Clear to auscultation bilaterally. No rales or rhonchi. No wheezes.  ABDOMEN:  Soft, flat, nontender, nondistended, has good bowel sounds. No hepatosplenomegaly appreciated.  EXTREMITIES:  No evidence of any cyanosis, clubbing, or peripheral edema. Has +2 pedal and radial pulses bilaterally. On the right foot, the patient has significant pain  on dorsum part of her right great toe, pain on extension and dorsiflexion of the ankle, but no visual redness or swelling or induration or mass noted.  SKIN:  Moist and warm with no rashes appreciated.  LYMPHATIC:  There is no cervical or axillary lymphadenopathy.   LABORATORY, DIAGNOSTIC, AND RADIOLOGICAL DATA:  As of April 23rd is as follows, serum glucose of 140, BUN 6, creatinine 0.5, sodium 140, potassium 3.9, chloride 111, bicarb 24, hemoglobin A1c 7.5. LFTs are within normal limits. White cell count is 12.1, hemoglobin 11.2, hematocrit 34.2, platelet count of 249.   ASSESSMENT AND PLAN:  This is a 39 year old female with a history of diabetes, attention deficit hyperactivity disorder, bipolar disorder, depression, chronic obstructive pulmonary disease, tobacco abuse, diabetic neuropathy, who was admitted to Behavioral Medicine due to drug overdose of Xanax. Hospitalist Services were contacted for a right foot pain ongoing since this morning.  1.  Right foot pain. The exact etiology of the pain is unclear but seems likely musculoskeletal in nature. I suspect that she truly has plantar fasciitis. Unlikely this is gout or cellulitis as she has no redness and no profound rash noted to be consistent with cellulitis. I will get an x-ray of her right foot to see if there is any foreign bodies or any fracture noted. We will apply an Ace wrap to her ankle for now. We will also give her Motrin scheduled for the next 2 days to see if it helps. If her symptoms do not improved, we would consider getting an MRI of her foot and also getting an Orthopaedic consult.  2.  Diabetes. I would continue her metformin. Her A1c is 7.5.  3.  Bipolar disorder/depression. Continue Zyprexa. Continue Prozac. Continue care as per Psychiatry for now. 4.  Tobacco abuse. Continue nicotine patch and Nicotrol inhaler. 5.  Chronic obstructive pulmonary disease. No acute exacerbation. Continue Symbicort and albuterol as needed.  6.   Diabetic neuropathy. Continue Lyrica.  7.  Gastroesophageal reflux disease. Continue omeprazole.   Thank you so for the consultation. We will follow along with you.   TIME SPENT FOR CONSULT:  50 minutes.  ____________________________ Rolly PancakeVivek J. Cherlynn KaiserSainani, MD vjs:jm D: 01/18/2013 15:52:00 ET T: 01/18/2013 16:56:43 ET JOB#: [id number]  cc: Rolly PancakeVivek J. Cherlynn KaiserSainani, MD, <Dictator> Houston SirenVIVEK J SAINANI MD ELECTRONICALLY SIGNED 01/18/2013 20:12

## 2015-01-15 NOTE — Discharge Summary (Signed)
PATIENT NAME:  Katrina Booker, Aamani B MR#:  [id number] DATE OF BIRTH:  1975-11-07  DATE OF ADMISSION:  01/15/2013 DATE OF DISCHARGE:  01/15/2013  PRIMARY CARE PHYSICIAN:  Dorothey Basemanavid Bronstein, MD  DISPOSITION:  The patient is transferred to inpatient behavior medicine under Dr. Toni Amendlapacs.  CONSULTANT:  Psych - Dr. Toni Amendlapacs.  PRESENTING COMPLAINT: Drug overdose.   DISCHARGE DIAGNOSES: 1.  Intentional drug overdose with benzodiazepine, which is Xanax, and Ambien. 2.  Bipolar disorder.  3.  Type 2 diabetes.   MEDICATIONS: 1.  Metformin 500 mg b.i.d.  2.  Prilosec 20 mg p.o. daily.  3.  Lyrica 50 mg p.o. t.i.d.  4.  Symbicort 80/4.5 two puffs b.i.d.  5.  Insulin 2 puffs every 4 hours.  6.  Albuterol SVN as needed.  7.  Lasix 40 mg 2 tablets daily.  8.  Potassium chloride 20 milliequivalents p.o. daily as needed.  9.  Folic acid 0.4 mg daily.  10.  Vitamin C 1000 mg 2 tablets daily.  11.  Magnesium oxide 400 mg 3 times a day.  12.  Zinc gluconate 15 mg 3 times a day.  13.  Vitamin B6 100 mg daily.   DISCHARGE DIET:  Carbohydrate-controlled.  ACTIVITY LIMITATIONS: None.   DISCHARGE INSTRUCTIONS:  Follow with your primary care physician in 2 to 4 weeks.  Psych medications per Dr. Toni Amendlapacs.   LABORATORY DATA:  Urine drug screen positive for benzodiazepines. White count 12.1, H and H 11.2 and 34.2 and platelet count 249. Glucose 140, BUN 6, creatinine 0.59, sodium 140, potassium 3.9, chloride 111, bicarb 24 and calcium 8.0. LFTs within normal limits. Albumin 2.9.  Hemoglobin A1c 7.5. PT-INR within normal limits. Amylase 40.  Lipase 162. Serum acetaminophen less than 2. TSH 2.2.    BRIEF SUMMARY OF HOSPITAL COURSE: [name] is a 39 year old Caucasian female with history of bipolar disorder and type 2 diabetes who comes to the Emergency Room after she got in an argument with her husband. She was admitted with:  1.  Altered mental status/toxic encephalopathy secondary to intentional drug overdose  in a suicidal attempt. The patient took 70 pills of 1 mg Xanax and Ambien.  She was admitted in the intensive care unit, started on IV fluids. The following day the patient was more alert and awake. She tolerated p.o. diet. Dr. Toni Amendlapacs saw the patient and recommended inpatient behavior medicine transfer. The patient was medically stable.  2.  Chronic history of asthma, stable. P.r.n. nebs.  Sats more than 92% on room air.  3.  Type 2 diabetes. Resume p.o. metformin.  4.  History of ADHD, insomnia anxiety and bipolar disorder. Medications per Dr. Toni Amendlapacs.  Her hospital stay otherwise remained stable.  TIME SPENT:  40 minutes. ____________________________ Wylie HailSona A. Allena KatzPatel, MD sap:sb D: 01/16/2013 13:25:00 ET T: 01/16/2013 13:44:44 ET JOB#: [id number]  cc: Sostenes Kauffmann A. Allena KatzPatel, MD, <Dictator> Teena Iraniavid M. Terance HartBronstein, MD Willow OraSONA A Kaydyn Sayas MD ELECTRONICALLY SIGNED 01/24/2013 6:55
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
## 2015-01-15 NOTE — Consult Note (Signed)
Brief Consult Note: Diagnosis: bipolar disorder.   Patient was seen by consultant.   Comments: Psychiatry: Patient seen. Chart reviewed. Obviously dangerous behavior but patient is currently denying any suicidal ideation. Despite what the first MD note says, I can find no evidence this patient was placed under IVC. [facility]. Will continue evaluation.  Electronic Signatures: Aylin Rhoads, Jackquline DenmarkJohn T (MD)  (Signed 23-Apr-14 09:45)  Authored: Brief Consult Note   Last Updated: 23-Apr-14 09:45 by Audery Amellapacs, Nathanael Krist T (MD)
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
## 2015-01-15 NOTE — Discharge Summary (Signed)
PATIENT NAME:  Katrina CuriaMERRITT, Jaleya B MR#:  [id number] DATE OF BIRTH:  26-Aug-1976  DATE OF ADMISSION:  01/15/2013 DATE OF DISCHARGE:  01/20/2013  HOSPITAL COURSE: See dictated history and physical for reason for admission. This is a 39 year old woman who was transferred to psychiatry from the medicine service after being stabilized from an overdose on her prescription medicine. Since being on psychiatry she has been cooperative with treatment. She has not reported any active suicidal ideation. Her affect has remained blunted but not tearful and her thoughts do not appear to be psychotic. The patient has not been continued on benzodiazepines on the behavioral health unit and has tolerated this well without any withdrawal symptoms. Her medications have been continued with her Prozac, but the dose has been increased to 80 mg a day from her prior 60. She continues to take Zyprexa 20 mg at night. For pain she is taking Lyrica 50 mg 3 times a day and has been treated with Motrin on the unit and is tolerating not being on any narcotic pain medicine. The patient has been involved in daily psychoeducational as therapeutic group and individual counseling. She is able to state positive things in her life that she has to live for and improved ways to cope with her stress. She has been in contact with her husband and says that she feels like things are getting better between them and she feels more confident about hopefully working things out with him when she goes home. She is agreeable to following up with [facility] in the community.   DISCHARGE MEDICATIONS:  1.  Albuterol inhaler 2 puffs q. 4 hours p.r.n. shortness of breath. 2.  Symbicort 80/4.5 inhaler 2 puffs b.i.d. 3.  Fluoxetine 80 mg per day. 4.  Lasix 80 mg per day p.r.n. for swelling. 5.  Glucophage 500 mg twice a day. 6.  Zyprexa 20 mg at bedtime. 7.  Prilosec 20 mg in the morning. 8.  Lyrica 50 mg 3 times a day.   LABORATORY RESULTS: Prior to admission  to the psychiatry ward, the patient's labs showed chemistry panel with an elevated glucose at 140. Creatinine slightly low at 5.9, BUN slightly low at 6, chloride elevated at 111, calcium low at 8.0. Total protein and albumin both low at 6.2 and 2.9. Magnesium level 1.5. Hemoglobin A1c modestly elevated at 7.5. Cholesterol panel largely normal, except for a LDL slightly elevated at 129.   MENTAL STATUS EXAM AT DISCHARGE: Casually dressed woman, appropriately groomed, appropriately interactive. Eye contact good. Psychomotor activity normal. Affect blunted, not tearful, not agitated. Mood stated as being improved. Thoughts are lucid without any loosening of associations or evidence of delusions. No evidence of paranoia. Denies auditory or visual hallucinations. Denies any suicidal or homicidal ideation. Has improved judgment and insight. Normal intelligence. Intact short and long-term memory.   DISPOSITION: Discharge back to her home. Follow up with [facility].   DIAGNOSIS, PRINCIPAL AND PRIMARY:   AXIS I:  Bipolar disorder, not otherwise specified.   SECONDARY DIAGNOSES:  AXIS I: Benzodiazepine dependence.   AXIS II: Borderline and histrionic features.   AXIS III: Diabetes, chronic obstructive pulmonary disease, obesity.   AXIS IV: Severe - recent stress from break-up with her husband.   AXIS V: Functioning at time of discharge 55. ____________________________ Audery AmelJohn T. Abbee Cremeens, MD jtc:sb D: 01/20/2013 12:23:43 ET T: 01/20/2013 12:57:28 ET JOB#: [id number]  cc: Audery AmelJohn T. Caelin Rayl, MD, <Dictator> Audery AmelJOHN T Harneet Noblett MD ELECTRONICALLY SIGNED 01/20/2013 22:59

## 2015-01-15 NOTE — Consult Note (Signed)
Brief Consult Note: Diagnosis: 1. Right foot pain 2. DM 3. ADHD 4. Bipolar Disorder 5. Tobacco abuse 6. COPD.   Patient was seen by consultant.   Consult note dictated.   Orders entered.   Comments: 39 yo female w/ hx of ADHD, DM, Bipolar disorder, COPD, diabetic neuropathy admitted to behavioral medicine for due to drug overdose of Xanax.  Hospital svc contacted to right foot pain.   1. Right Foot pain - etiology unclear but seems musculoskeletal in nature. ?? plantar Fascitis.  - unlikely gout or Cellulitis.  - will get X-ray of Right foot, place ACE wrap and also given Motrin scheduled for 2 days to see if it helps.  - if not improving consider MRI of Foot and possible Ortho consult.   2. DM - cont. Metformin, A1c is 7.5.   3. bipolar disorder/Depression - cont. Zyprexa, Prozac.  - cont. care as per Psych.   4. Tobacco abuse - cont. Nicotine patch, nicotrol inhaler.   5. COPD - no acute exacerbation.  - cont. symbicort, Albuterol PRN  Thanks for the consult and will follow with you   Job # [id number].  Electronic Signatures: Houston SirenSainani, Vivek J (MD)  (Signed 26-Apr-14 15:52)  Authored: Brief Consult Note   Last Updated: 26-Apr-14 15:52 by Houston SirenSainani, Vivek J (MD)

## 2015-01-15 NOTE — Discharge Summary (Signed)
PATIENT NAME:  Katrina CuriaMERRITT, Philomena B MR#:  [id number] DATE OF BIRTH:  Dec 31, 1975  DATE OF ADMISSION:  12/07/2012 DATE OF DISCHARGE:  12/10/2012  HOSPITAL COURSE: See dictated history and physical for details of admission. This is a 39 year old woman with a history of bipolar disorder, who presented with a 1-day history of auditory and visual hallucinations that had no really clear cause or precipitant. She did not engage in any dangerous behavior. In the hospital, she was stabilized and continued normal medication for her bipolar disorder. She did not report hallucinations in the hospital. She showed a rapid resolution of her symptoms. By the time of discharge, she was alert, oriented and able to hold very normal conversations. No sign of delusions or hallucinations. No paranoia. Denied any hallucinations. Totally denies suicidal or homicidal ideation. Showed good insight into her illness. She was agreeable to continuing on her medication and following up with Dr. Tomasa Randunningham. She received psychoeducation regarding illness and proper treatment and is agreeable to this.   DISCHARGE PLAN: Discharge home with her family. Follow up [place] Dr. [place] in [place].   DISCHARGE MEDICATIONS: Continue her outpatient treatment by Dr. Tomasa Randunningham, although I suggested to her that she reconsider this. In the hospital, she was on plain Adderall 40 mg in the morning and 20 in the afternoon because that is all we have on formulary. Ambien 10 mg at night, Lyrica 50 mg 3 times a day, Prilosec 20 mg in the morning, Zyprexa 20 mg at bedtime, metformin 500 mg twice a day, Prozac 80 mg per day, Symbicort inhaler 2 puffs b.i.d., Xanax 1 mg 3 times a day and albuterol inhaler 2 puffs p.r.n. shortness of breath.   LABORATORY RESULTS: Admission labs showed a drug screen positive for amphetamines, consistent with her outpatient medicine. Urinalysis did not appear to be infected. TSH normal at 2. Alcohol undetected. Chemistry showed  low sodium of 135, glucose 126. White blood cell count elevated at 15, otherwise normal CBC. CT of the head normal.   MENTAL STATUS EXAM AT DISCHARGE: Neatly dressed and groomed woman, looks her stated age, cooperative with the interview. Good eye contact. Normal psychomotor activity. Speech normal rate, tone and volume. Affect euthymic, reactive, appropriate. Mood stated as good. Denies suicidal or homicidal ideation. Denies any hallucinations at all and is not behaving as though she is responding to internal stimuli. Good short and long-term memory. Good insight and judgment. Normal intelligence.   DIAGNOSIS, PRINCIPAL AND PRIMARY: AXIS I: Bipolar disorder type I mixed episode.   SECONDARY DIAGNOSES:  AXIS I: Attention deficit/hyperactivity disorder by history.  AXIS II: Deferred.  AXIS III: Gastrointestinal reflux, chronic obstructive pulmonary disease, chronic pain.  AXIS IV: Moderate. Chronic stress from her illness.  AXIS V: Functioning at time of discharge 55.   ____________________________ Audery AmelJohn T. Clapacs, MD jtc:aw D: 12/26/2012 12:08:02 ET T: 12/26/2012 12:39:36 ET JOB#: [id number]  cc: Audery AmelJohn T. Clapacs, MD, <Dictator> Audery AmelJOHN T CLAPACS MD ELECTRONICALLY SIGNED 12/26/2012 13:09

## 2015-01-15 NOTE — Consult Note (Signed)
PATIENT NAME:  Katrina Booker, Katrina Booker MR#:  [id number] DATE OF BIRTH:  07/20/1976  DATE OF CONSULTATION:  05/29/2013  REFERRING PHYSICIAN:  Dr. Cyril LoosenKinner CONSULTING PHYSICIAN:  Ardeen FillersUzma S. Garnetta BuddyFaheem, MD  REASON FOR CONSULTATION:  "I wanted to get back at my husband".   HISTORY OF PRESENT ILLNESS:  The patient is a 39 year old married Caucasian female who presented to the ED via EMS after she took an overdose of 75 pills of 1 mg Xanax. She reported that she just wanted to get back to her husband. During my interview, the patient reported that her husband accused her of taking drugs. She reported that her husband was talking to her on the phone and then he stated that her speech appears slurred. She stated that she has been married for the past 20 years and she has never used drugs. She got very upset as he was accusing her of the same. She went back and she took the whole bottle of 75 pills of Xanax 1 mg, which are prescribed by Dr. Tomasa Randunningham, who is her psychiatrist. The patient reported that she takes Xanax on an as-needed basis and she has a bottle with her. The patient reported that her husband called the EMS and she was brought to the hospital. The patient has done a similar attempt in April when she got upset in her husband when she saw him leaving with the children in the car and swallowed her whole bottle of Xanax in front of him. The patient reported that she was not trying to kill herself at that time also. She reported that she does not abuse her benzodiazepines but takes them only on a p.r.n. basis. She reported that she loses her temper quickly, but then she tries to control her anger. She reported that Dr. Tomasa Randunningham has been trying to adjust her medication and he has recently added Latuda 60 mg in addition to Zyprexa 20 mg at bedtime. She has not seen any improvement in her mood swings at this time. She remains angry, agitated and continues to have mood swings at this time. The patient was unable to contract  for safety and will be admitted to the inpatient unit at this time.   PAST PSYCHIATRIC HISTORY:  The patient has a history of at least two hospitalizations in the past. The patient reported that she has taken the medications under the stress as she was having problems with her husband. She has been diagnosed with bipolar disorder in the past. She is currently taking a combination of Latuda and Zyprexa. She reported that she has never tried to harm anybody else.   PAST MEDICAL HISTORY:  The patient reported that she has been diagnosed with diabetes. She has also been diagnosed with anemia.   CURRENT MEDICATIONS:  The patient reported that she has been taking Adderall 30 mg in the morning and 25 mg in the evening. She is also taking Latuda 60 mg at bedtime, 40 mg in the morning, and Zyprexa 20 mg at bedtime. She is also taking Xanax 1 mg 3 times a day as needed.   SUBSTANCE ABUSE HISTORY:  The patient reported that she does not drink on a regular basis.   ALLERGIES:  HYDROCODONE.  REVIEW OF SYSTEMS.  CONSTITUTIONAL:  The patient denies any fever or chills. No weight changes.  EYES:  No double or blurred vision.  RESPIRATORY:  No shortness of breath or cough.  CARDIOVASCULAR:  No chest pain or orthopnea. GASTROINTESTINAL:  No abdominal pain, nausea, vomiting  or diarrhea.  GENITOURINARY:  No incontinence or frequency.  ENDOCRINE:  No heat or cold intolerance.  LYMPHATIC:  No anemia or easy bruising.  INTEGUMENTARY:  No acne or rash.  MUSCULOSKELETAL:  No muscle or joint pain.  NEUROLOGIC:  No tingling or weakness.   MENTAL STATUS EXAMINATION:  The patient is a moderately built female who appeared her stated age. She appeared very calm, connective during the interview. She maintained fair eye contact. Her mood was fine. Affect was congruent. Thought process appeared logical and goal-directed. Thought content appeared nondelusional. She demonstrated poor insight and judgment regarding her recent  suicide attempt.   VITAL SIGNS:  Temperature 98.3, pulse 88, respirations 18, blood pressure 124/72.   Glucose 132, BUN 11, creatinine 0.55, sodium 137, potassium 3.8, chloride 108, bicarbonate 21, anion gap 8, calcium 8.8. Blood alcohol less than 3. Protein 6.9, albumin 3.2, bilirubin 0.1, AST 17, ALT 21. TSH 2.22. UA is positive for benzodiazepines and tricyclic antidepressants. WBC 8.8, hemoglobin 11.8, hematocrit 34.1, MCV 84, RDW 15.   DIAGNOSTIC IMPRESSION:  AXIS I: 1.  Bipolar disorder, not otherwise specified.                2.  Benzodiazepine abuse.  AXIS II: Borderline personality traits.   AXIS III:  A history of chronic pain, diabetes, overweight, chronic obstructive pulmonary disease.   TREATMENT PLAN:   1.  The patient will be admitted to the Inpatient Behavioral Health Unit for stabilization and safety.  2.  She will be started back on Latuda 60 mg in the morning. She will also be given Librium 25 mg every 6 hours for the next 3 days to help with the withdrawal from the Xanax. She will also be continued on Prozac 20 mg in the morning.  3.  Treatment team will evaluate the patient, adjust her medications according to her needs.   Thank you for allowing me to participate in the care of this patient.   ____________________________ Ardeen Fillers. Garnetta Buddy, MD usf:jm D: 05/29/2013 13:10:14 ET T: 05/29/2013 15:07:03 ET JOB#: [id number]  cc: Ardeen Fillers. Garnetta Buddy, MD, <Dictator> Rhunette Croft MD ELECTRONICALLY SIGNED 06/05/2013 13:23

## 2015-01-15 NOTE — Consult Note (Signed)
Brief Consult Note: Diagnosis: bipolar disorder depressed.   Patient was seen by consultant.   Recommend further assessment or treatment.   Comments: Psychiatry: Bed now availible on BH. Commitment paperwork done. Patient accepted for transfer. BH nurse to see and arrange. Please complete discharge of patient from medical.  Electronic Signatures: Kale Rondeau, Jackquline DenmarkJohn T (MD)  (Signed 23-Apr-14 22:12)  Authored: Brief Consult Note   Last Updated: 23-Apr-14 22:12 by Audery Amellapacs, Wister Hoefle T (MD)

## 2015-01-15 NOTE — H&P (Signed)
PATIENT NAME:  Katrina Booker, Katrina Booker MR#:  [id number] DATE OF BIRTH:  December 11, 1975  DATE OF ADMISSION:  05/29/2013  REFERRING PHYSICIAN: Emergency Room M.D.  ATTENDING PHYSICIAN: Kristine LineaJolanta Kimimila Booker, M.D.   IDENTIFYING DATA:  Ms. Katrina Booker is a 39 year old female with history of depression, mood instability and substance abuse.   CHIEF COMPLAINT: "I don't know why I did it."   HISTORY OF PRESENT ILLNESS:  Ms. Katrina Booker reports that she has been doing fairly well recently, treated with a combination of Zyprexa, Latuda and Xanax by her primary psychiatrist. She was arguing with her husband. She reports that rarely they fight so bad, and in response, she overdosed on 75, 1 mg tablets of Xanax. The husband was on the phone and called police. They were arguing because the husband accused her of using drugs. He said that her speech was slurred during the conversation and threatened a divorce. The patient reports that she has been overwhelmed lately with her responsibilities as a wife and a mother. They have been married for 20 years. 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 She complains that she has no help with the housework and oftentimes feels overwhelmed. She also feels that her husband has very limited understanding of problems that come with bipolar disorder. In addition, she suffers chronic pain for which she is treated with Lyrica, but oftentimes feels incapacitated with pain. She drinks some but reports that her drinking is social, would have a beer or a glass of wine. Does not drink daily. She denies illicit substance use. She adamantly denies any misuse of her prescribed pills. She reports poor sleep, decreased appetite, anhedonia, feeling of guilt, hopelessness, worthlessness, poor energy and concentration and social isolation, irritability. She  denies suicidal ideation and states that she overdosed on an impulse because she was trying to get back at her husband. He accused her of being on drugs, so she decided to swallow Xanax to show him how a person on drugs looks like.   PAST PSYCHIATRIC HISTORY: The patient was [facility]. In the spring of this year, she became psychotic and was diagnosed with bipolar illness. She returned to the hospital in April. She overdosed on Xanax as well. She reports good compliance with medications and her psychiatric visits.    FAMILY PSYCHIATRIC HISTORY:  None reported.  PAST MEDICAL HISTORY: Chronic back pain, COPD, GERD, diabetes.   ALLERGIES:  HYDROCODONE.   MEDICATIONS ON ADMISSION:   Latuda 60 mg with supper, metformin 500 mg twice daily, Zyprexa 20 mg at bedtime, Prilosec 20 mg in the morning, Ambien 10 mg at bedtime, albuterol inhaler as needed, Adderall 30 mg in the morning, 20 mg in the afternoon; Symbicort twice daily, Prozac 40 mg in the morning, lisinopril 10 mg daily, Lyrica 50 mg 3 times daily.   SOCIAL HISTORY: She is married. She has been married for 20 years. She has 1 son from previous marriage, who is [id number] and lives with her, and 2 children from current relationship. 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 She has [employer] insurance.   REVIEW OF SYSTEMS:  CONSTITUTIONAL: No fevers or chills. No weight changes.  EYES:  No double or blurred vision.  EARS, NOSE, THROAT: No hearing loss.  RESPIRATORY: No shortness of breath or cough.  CARDIOVASCULAR: No chest pain or orthopnea.  GASTROINTESTINAL: No abdominal pain, nausea, vomiting or diarrhea.  GENITOURINARY: No incontinence or frequency.  ENDOCRINE: No heat or cold intolerance.  LYMPHATIC: No anemia or easy bruising.  INTEGUMENTARY: No acne or rash.  MUSCULOSKELETAL: No muscle or joint pain.   NEUROLOGIC: No tingling or weakness.  PSYCHIATRIC: See history of present illness for details.   PHYSICAL EXAMINATION: VITAL SIGNS: Blood pressure 118/72, pulse 55, respirations 18, temperature 98.7.  GENERAL: This is a well-developed female in no acute distress.  HEENT: The pupils are equal, round and reactive to light. Sclerae anicteric.  NECK: Supple. No thyromegaly.  LUNGS: Clear to auscultation. No dullness to percussion.  HEART: Regular rhythm and rate. No murmurs, rubs or gallops.  ABDOMEN: Soft, nontender, nondistended. Positive bowel sounds.  MUSCULOSKELETAL: Normal muscle strength in all extremities.  SKIN: No rashes or bruises.  LYMPHATIC: No cervical adenopathy.  NEUROLOGIC: Cranial nerves II through XII are intact.   LABORATORY DATA: Chemistries are within normal limits except for blood glucose of 132. Blood alcohol level is 0. LFTs within normal limits. TSH 2.22. Urine tox screen positive for benzodiazepines and tricyclic antidepressants. CBC within normal limits. Urinalysis is not suggestive of urinary tract infection. Serum acetaminophen and salicylates are low. Urine pregnancy test is negative.  EKG: Sinus tachycardia, possible left atrial enlargement, borderline EKG.   MENTAL STATUS EXAMINATION ON ADMISSION: The patient is alert and oriented to person, place, time and situation. She is pleasant, polite and cooperative. She is well-groomed and casually dressed. She maintains good eye contact. Her speech is of normal rhythm, rate and volume. Mood is depressed with anxious affect. Thought process is logical and goal oriented. Thought content: She denies suicidal or homicidal ideation, but was admitted after a suicide attempt by overdose. There are no delusions or paranoia. There are no auditory or visual hallucinations. Her cognition is grossly intact. She registers 3 out of 3 and recalls 3 out of 3 objects after 5 minutes. She can have spell "world" forward and backward. She knows  the current president. Her insight and judgment are questionable.   SUICIDE RISK ASSESSMENT ON ADMISSION: This is a patient with a long history of depression, mood instability, substance abuse and suicidal attempts, who was admitted to the hospital following overdose on Xanax. She is at increased risk of suicide.   DIAGNOSES:  AXIS I:  Bipolar disorder, not otherwise specified. Benzodiazepine dependence.  AXIS II: Deferred.  AXIS III: Diabetes, chronic obstructive pulmonary disease, obesity, gastroesophageal reflux disease.  AXIS IV: Mental illness, substance abuse, family conflict.  AXIS V: Global Assessment of Functioning on admission 25.   PLAN: The patient was admitted to [facility] behavioral medicine unit for safety, stabilization and medication management. She was placed on suicide precautions and was closely monitored for any unsafe behaviors. She underwent full psychiatric and risk assessment. She received pharmacotherapy, individual and group psychotherapy, substance abuse counseling and support from therapeutic milieu.  1.  Suicidal ideation: The patient denies and is able to contract for safety.  2.  Mood:  We will continue all her medications as prescribed in the community; Latuda, Zyprexa, Ambien and Prozac. We will consider changing Prozac to Luvox, as the patient complained of multiple symptoms suggestive of OCD. The patient was informed about possible  liver complications of Luvox, and she agrees with the plan.  3.  Substance use: The patient adamantly denies any misuse of substances. She is only positive for benzos. She is positive for tricyclic antidepressants that sometimes comes from Seroquel, but she denies using Seroquel recently.  4.  Medical:  We will continue all medications for hypertension, GERD, COPD and diabetes.  6.  Chronic pain: This will be treated with Lyrica.  7.  ADHD.  The patient reports that she is on Adderall 30 mg in the morning, 20 in  the afternoon. I prefer not to prescribe stimulants on the unit. There is no need for it, but the patient assures me that her psychiatrist prefers that she take it daily. We will continue 30 mg in the morning.   She is uncertain if the husband was serious about getting a divorce. There will be a family meeting prior to discharge.   DISPOSITION: To be established.     ____________________________ Ellin Goodie. Santiel Topper, MD jbp:dmm D: 05/30/2013 19:42:22 ET T: 05/30/2013 20:22:24 ET JOB#: [id number]  cc: Charity Tessier Booker. Jennet Maduro, MD, <Dictator> Shari Prows MD ELECTRONICALLY SIGNED 06/03/2013 6:30

## 2015-04-14 ENCOUNTER — Ambulatory Visit: Payer: 59 | Attending: Anesthesiology | Admitting: Anesthesiology

## 2015-04-14 ENCOUNTER — Encounter: Payer: Self-pay | Admitting: Anesthesiology

## 2015-04-14 VITALS — BP 106/71 | HR 95 | Temp 98.4°F | Resp 16 | Ht 65.0 in | Wt 220.0 lb

## 2015-04-14 DIAGNOSIS — M1288 Other specific arthropathies, not elsewhere classified, other specified site: Secondary | ICD-10-CM | POA: Diagnosis not present

## 2015-04-14 DIAGNOSIS — M545 Low back pain, unspecified: Secondary | ICD-10-CM

## 2015-04-14 DIAGNOSIS — M47817 Spondylosis without myelopathy or radiculopathy, lumbosacral region: Secondary | ICD-10-CM

## 2015-04-14 DIAGNOSIS — M5136 Other intervertebral disc degeneration, lumbar region: Secondary | ICD-10-CM | POA: Insufficient documentation

## 2015-04-14 DIAGNOSIS — F1190 Opioid use, unspecified, uncomplicated: Secondary | ICD-10-CM | POA: Insufficient documentation

## 2015-04-14 DIAGNOSIS — M25552 Pain in left hip: Secondary | ICD-10-CM | POA: Diagnosis present

## 2015-04-14 MED ORDER — TRIAMCINOLONE ACETONIDE 40 MG/ML IJ SUSP
INTRAMUSCULAR | Status: AC
  Administered 2015-04-14: 40 mg
  Filled 2015-04-14: qty 1

## 2015-04-14 MED ORDER — ROPIVACAINE HCL 2 MG/ML IJ SOLN
INTRAMUSCULAR | Status: AC
  Administered 2015-04-14: 9 mL
  Filled 2015-04-14: qty 10

## 2015-04-14 MED ORDER — MIDAZOLAM HCL 5 MG/5ML IJ SOLN
INTRAMUSCULAR | Status: AC
  Administered 2015-04-14: 4 mg via INTRAVENOUS
  Filled 2015-04-14: qty 5

## 2015-04-14 MED ORDER — FENTANYL CITRATE (PF) 100 MCG/2ML IJ SOLN
INTRAMUSCULAR | Status: AC
  Filled 2015-04-14: qty 2

## 2015-04-14 MED ORDER — FENTANYL CITRATE (PF) 100 MCG/2ML IJ SOLN
INTRAMUSCULAR | Status: AC
  Administered 2015-04-14: 50 ug via INTRAVENOUS
  Filled 2015-04-14: qty 2

## 2015-04-14 MED ORDER — OXYCODONE-ACETAMINOPHEN 7.5-325 MG PO TABS: 1.0000 | ORAL_TABLET | ORAL | Status: DC | PRN

## 2015-04-14 NOTE — Progress Notes (Signed)
Safety precautions to be maintained throughout the outpatient stay will include: orient to surroundings, keep bed in low position, maintain call bell within reach at all times, provide assistance with transfer out of bed and ambulation.  

## 2015-04-14 NOTE — Patient Instructions (Addendum)
Pain Management Discharge Instructions  General Discharge Instructions :  If you need to reach your doctor call: Monday-Friday 8:00 am - 4:00 pm at [phone] or toll free [phone].  After clinic hours [phone] to have operator reach doctor.  Bring all of your medication bottles to all your appointments in the pain clinic.  To cancel or reschedule your appointment with Pain Management please remember to call 24 hours in advance to avoid a fee.  Refer to the educational materials which you have been given on: General Risks, I had my Procedure. Discharge Instructions, Post Sedation.  Post Procedure Instructions:  The drugs you were given will stay in your system until tomorrow, so for the next 24 hours you should not drive, make any legal decisions or drink any alcoholic beverages.  You may eat anything you prefer, but it is better to start with liquids then soups and crackers, and gradually work up to solid foods.  Please notify your doctor immediately if you have any unusual bleeding, trouble breathing or pain that is not related to your normal pain.  Depending on the type of procedure that was done, some parts of your body may feel week and/or numb.  This usually clears up by tonight or the next day.  Walk with the use of an assistive device or accompanied by an adult for the 24 hours.  You may use ice on the affected area for the first 24 hours.  Put ice in a Ziploc bag and cover with a towel and place against area 15 minutes on 15 minutes off.  You may switch to heat after 24 hours.Facet Joint Block The facet joints connect the bones of the spine (vertebrae). They make it possible for you to bend, twist, and make other movements with your spine. They also prevent you from overbending, overtwisting, and making other excessive movements.  A facet joint block is a procedure where a numbing medicine (anesthetic) is injected into a facet joint. Often, a type of anti-inflammatory  medicine called a steroid is also injected. A facet joint block may be done for two reasons:   Diagnosis. A facet joint block may be done as a test to see whether neck or back pain is caused by a worn-down or infected facet joint. If the pain gets better after a facet joint block, it means the pain is probably coming from the facet joint. If the pain does not get better, it means the pain is probably not coming from the facet joint.   Therapy. A facet joint block may be done to relieve neck or back pain caused by a facet joint. A facet joint block is only done as a therapy if the pain does not improve with medicine, exercise programs, physical therapy, and other forms of pain management. LET [facility] CARE PROVIDER KNOW ABOUT:   Any allergies you have.   All medicines you are taking, including vitamins, herbs, eyedrops, and over-the-counter medicines and creams.   Previous problems you or members of your family have had with the use of anesthetics.   Any blood disorders you have had.   Other health problems you have. RISKS AND COMPLICATIONS Generally, having a facet joint block is safe. However, as with any procedure, complications can occur. Possible complications associated with having a facet joint block include:   Bleeding.   Injury to a nerve near the injection site.   Pain at the injection site.   Weakness or numbness in areas controlled by nerves near  the injection site.   Infection.   Temporary fluid retention.   Allergic reaction to anesthetics or medicines used during the procedure. BEFORE THE PROCEDURE   Follow your health care provider's instructions if you are taking dietary supplements or medicines. You may need to stop taking them or reduce your dosage.   Do not take any new dietary supplements or medicines without asking your health care provider first.   Follow your health care provider's instructions about eating and drinking before the  procedure. You may need to stop eating and drinking several hours before the procedure.   Arrange to have an adult drive you home after the procedure. PROCEDURE  You may need to remove your clothing and dress in an open-back gown so that your health care provider can access your spine.   The procedure will be done while you are lying on an X-ray table. Most of the time you will be asked to lie on your stomach, but you may be asked to lie in a different position if an injection will be made in your neck.   Special machines will be used to monitor your oxygen levels, heart rate, and blood pressure.   If an injection will be made in your neck, an intravenous (IV) tube will be inserted into one of your veins. Fluids and medicine will flow directly into your body through the IV tube.   The area over the facet joint where the injection will be made will be cleaned with an antiseptic soap. The surrounding skin will be covered with sterile drapes.   An anesthetic will be applied to your skin to make the injection area numb. You may feel a temporary stinging or burning sensation.   A video X-ray machine will be used to locate the joint. A contrast dye may be injected into the facet joint area to help with locating the joint.   When the joint is located, an anesthetic medicine will be injected into the joint through the needle.   Your health care provider will ask you whether you feel pain relief. If you do feel relief, a steroid may be injected to provide pain relief for a longer period of time. If you do not feel relief or feel only partial relief, additional injections of an anesthetic may be made in other facet joints.   The needle will be removed, the skin will be cleansed, and bandages will be applied.  AFTER THE PROCEDURE   You will be observed for 15-30 minutes before being allowed to go home. Do not drive. Have an adult drive you or take a taxi or public transportation instead.    If you feel pain relief, the pain will return in several hours or days when the anesthetic wears off.   You may feel pain relief 2-14 days after the procedure. The amount of time this relief lasts varies from person to person.   It is normal to feel some tenderness over the injected area(s) for 2 days following the procedure.   If you have diabetes, you may have a temporary increase in blood sugar. Document Released: 01/31/2007 Document Revised: 01/26/2014 Document Reviewed: 07/01/2012 [facility] Patient Information 2015 [employer] [place]. This information is not intended to replace advice given to you by your health care provider. Make sure you discuss any questions you have with your health care provider. PATIENT NOTIFIED TO CALL US FOR 1 MONTH APPOINTMENT.

## 2015-04-14 NOTE — Progress Notes (Signed)
Left message with Dr Lovell SheehanJenkins assistant, Marylene LandAngela to schedule appointment.

## 2015-04-15 ENCOUNTER — Telehealth: Payer: Self-pay | Admitting: *Deleted

## 2015-04-15 NOTE — Telephone Encounter (Signed)
Left msg

## 2015-04-15 NOTE — Progress Notes (Signed)
Chief complaint: Severe low back pain with radiation into the Lhip and buttock region.  Procedure:L Side lumbar facet medial branch block under fluoroscopic guidance with moderate sedation at L2-3 L3-4 or L4-5 L5-S1 and S1  History of present illness:Katrina B Merrittpresents for reevaluation today. She was last seen on April 18 at which point she had a left-sided facet block. She states that she did quite well with this for approximately 2 months knocking out 75-80% of her pain on the left side. She presents today for reevaluation with a similar complaint and recurrence of the same pain that she had previously. No change in lower extremity strength or function are noted in her bowel and bladder function have been stable. At this point she is doing some stretching and strengthening but no daily formal plan has been initiated. She is still followed by her psychiatrist and is transitioning to a new psychiatrist at this point. Her medications have been prescribed by them in the past and she is requesting medication management by [place] today. She has had some chronic weakness in the left leg that has been present for many months but appears stable. She was seen by Dr. Theora Master and had some nerve conduction studies on 04/01/2015 showing evidence of an abnormal study with moderate severity generalized lower extremity predominant polyneuropathy   Current outpatient prescriptions:  .  albuterol (PROVENTIL HFA;VENTOLIN HFA) 108 (90 BASE) MCG/ACT inhaler, Inhale 2 puffs into the lungs every 6 (six) hours as needed. For ashtma, Disp: , Rfl:  .  ALPRAZolam (XANAX) 0.5 MG tablet, Take 0.5 mg by mouth at bedtime as needed for anxiety., Disp: , Rfl:  .  amphetamine-dextroamphetamine (ADDERALL XR) 20 MG 24 hr capsule, Take 20-40 mg by mouth every morning. Patient takes 2 in the morning and 1 in the afternoon, Disp: , Rfl:  .  budesonide-formoterol (SYMBICORT) 80-4.5 MCG/ACT inhaler, Inhale 2 puffs into the lungs 2  (two) times daily., Disp: , Rfl:  .  lamoTRIgine (LAMICTAL) 200 MG tablet, Take 200 mg by mouth daily., Disp: , Rfl:  .  lisinopril (PRINIVIL,ZESTRIL) 20 MG tablet, Take 20 mg by mouth daily., Disp: , Rfl:  .  metFORMIN (GLUCOPHAGE) 500 MG tablet, Take 500 mg by mouth 2 (two) times daily with a meal., Disp: , Rfl:  .  OLANZapine (ZYPREXA) 20 MG tablet, Take 20 mg by mouth at bedtime., Disp: , Rfl:  .  omeprazole (PRILOSEC) 20 MG capsule, Take 20 mg by mouth daily., Disp: , Rfl:  .  oxyCODONE-acetaminophen (PERCOCET) 7.5-325 MG per tablet, Take 1 tablet by mouth every 4 (four) hours as needed for moderate pain., Disp: 60 tablet, Rfl: 0 .  zolpidem (AMBIEN) 10 MG tablet, Take 10 mg by mouth at bedtime., Disp: , Rfl:  .  oxyCODONE-acetaminophen (PERCOCET) 5-325 MG per tablet, Take 1-2 tablets by mouth every 6 (six) hours as needed for pain., Disp: 20 tablet, Rfl: 0  BP 106/71 mmHg  Pulse 95  Temp(Src) 98.4 F (36.9 C) (Oral)  Resp 16  Ht 5\' 5"  (1.651 m)  Wt 220 lb (99.791 kg)  BMI 36.61 kg/m2  SpO2 95%  There are no active problems to display for this patient.   Allergies  Allergen Reactions  . Hydrocodone Itching    hives      Physical exam: Patient is alert and oriented 3 and cooperative and compliant.  Heart is regular rate and rhythm without murmur  Lungs are clear to auscultation  Inspection of the low back reveals  significant paraspinous muscle tenderness but no overt trigger points with worsening of pain on extension with lateral rotation to the L side causing radiation of pain to the ipsilateral hip and buttock region. Strength appears to be at baseline with diminished strength with flexion and extension of the left hip and dorsiflexion of left foot are still present. No other changes in sensory or motor function are noted.  Assessment:  #45facet arthropathy  #2 degenerative disc disease  #3 myofascial low back pain  #4 chronic opioid management and followed by  psychiatry  Plan:  #1 we will proceed with a lumbar facet block today as described to the patient. All questions have been answered and the risks and benefits reviewed in full detail. They desire to proceed with this plan today. There is no guarantee of pain relief as discussed with the patient. Risks and benefits of an reviewed in full detail and all questions answered   #2 continue with back stretching strengthening exercises as tolerated and I have reiterated that this is important to help reduce spasming in the lower back.  #3 I'm willing to trial her on a 3 month course of a few weights. We will defer until we have some clearance from her psychiatry doctors that she is of low risk with her previous history of overdose as documented by Dr. Mardee Postin with Xanax  #4.  We have requested that she be evaluated by a neurosurgeon and I have offered a referral to Dr. Delma Officer in [place]. We will try to assist with this referral but I have asked her to follow-up with Dr. Lovell Sheehan office to help in an effort to further evaluate whether any additional surgery would be of benefit to her.  #5.  I have also encouraged her to follow-up with Dr. Malvin Johns in regards to the treatment of the polyneuropathy.  Procedure note patient was taken to the fluoroscopy suite and placed in the prone position. 5 mg of Versed with 1 cc of fentanyl were titrated for moderate sedation. After informed consent was reviewed and all questions answered which was done prior to sedation we pursued a left side facet block. I Identified the areas overlying the after mentioned facets. This was  done with AP and lateral plus tunnel-type fluoroscopic guidance. 1% lidocaine was infiltrated to the subcutaneous and fascia 1 cc overlying each of the aforementioned facets. I then advanced 22-gauge 3-1/2 inch quickie needles with the needle tip to lie at the Wise I portion of the [employer] dog. There was no paresthesia negative aspiration and  this was well tolerated. I then injected 2 cc of ropivacaine 0.2% mixed with 8 mg of triamcinolone at each site. These needles were withdrawn and the L5-S1 needle was redirected towards the S1 foramen. This was advanced without paresthesia and 2 cc of the same mixture was injected at that site. The patient was convalesced discharged home in stable condition for follow-up as mentioned in approximately one to 2 months.  Dr. Gwenyth Bender MD

## 2015-05-10 ENCOUNTER — Encounter: Payer: Self-pay | Admitting: Anesthesiology

## 2015-05-10 ENCOUNTER — Ambulatory Visit: Payer: 59 | Attending: Anesthesiology | Admitting: Anesthesiology

## 2015-05-10 VITALS — BP 115/76 | HR 100 | Temp 98.2°F | Resp 16 | Ht 65.0 in | Wt 230.0 lb

## 2015-05-10 DIAGNOSIS — M1288 Other specific arthropathies, not elsewhere classified, other specified site: Secondary | ICD-10-CM | POA: Diagnosis not present

## 2015-05-10 DIAGNOSIS — G8929 Other chronic pain: Secondary | ICD-10-CM | POA: Diagnosis not present

## 2015-05-10 DIAGNOSIS — M545 Low back pain: Secondary | ICD-10-CM | POA: Diagnosis present

## 2015-05-10 DIAGNOSIS — R45851 Suicidal ideations: Secondary | ICD-10-CM | POA: Diagnosis not present

## 2015-05-10 DIAGNOSIS — G629 Polyneuropathy, unspecified: Secondary | ICD-10-CM | POA: Insufficient documentation

## 2015-05-10 DIAGNOSIS — M47816 Spondylosis without myelopathy or radiculopathy, lumbar region: Secondary | ICD-10-CM

## 2015-05-10 DIAGNOSIS — F1190 Opioid use, unspecified, uncomplicated: Secondary | ICD-10-CM | POA: Diagnosis not present

## 2015-05-10 MED ORDER — OXYCODONE-ACETAMINOPHEN 7.5-325 MG PO TABS: 1.0000 | ORAL_TABLET | ORAL | Status: DC | PRN

## 2015-05-10 NOTE — Progress Notes (Signed)
Safety precautions to be maintained throughout the outpatient stay will include: orient to surroundings, keep bed in low position, maintain call bell within reach at all times, provide assistance with transfer out of bed and ambulation.  

## 2015-05-11 NOTE — Progress Notes (Signed)
Chief complaint is low back pain  Procedure: none  History of present illness: Katrina Booker continues to do reasonably well with the current medication regimen. In the past she has been taking Percocet twice a day. She presents today with a letter from her psychiatrist maintaining that it is reasonable for her to be on chronic of your lites for control of her low back pain. She denies any desire to divert or use these drugs illicitly. She also states that the pain has been quite recalcitrant and in the past is responded favorably to Percocet when taken twice a day. Her last MRI was in January 2016 overall The pain continues to stay reasonably well controlled with medication management and no  significant changes noted in baseline symptom complex. No change in lower extremity strength or function or bowel bladder function. Based on the  narcotic assessment sheet, the  patient continues to do well with this current regimenBP 115/76 mmHg  Pulse 100  Temp(Src) 98.2 F (36.8 C) (Oral)  Resp 16  Ht 5\' 5"  (1.651 m)  Wt 230 lb (104.327 kg)  BMI 38.27 kg/m2  SpO2 100%   Current outpatient prescriptions:  .  albuterol (PROVENTIL HFA;VENTOLIN HFA) 108 (90 BASE) MCG/ACT inhaler, Inhale 2 puffs into the lungs every 6 (six) hours as needed. For ashtma, Disp: , Rfl:  .  ALPRAZolam (XANAX) 0.5 MG tablet, Take 0.5 mg by mouth at bedtime as needed for anxiety., Disp: , Rfl:  .  amphetamine-dextroamphetamine (ADDERALL XR) 20 MG 24 hr capsule, Take 20-40 mg by mouth every morning. Patient takes 2 in the morning and 1 in the afternoon, Disp: , Rfl:  .  budesonide-formoterol (SYMBICORT) 80-4.5 MCG/ACT inhaler, Inhale 2 puffs into the lungs 2 (two) times daily., Disp: , Rfl:  .  lamoTRIgine (LAMICTAL) 200 MG tablet, Take 200 mg by mouth daily., Disp: , Rfl:  .  lisinopril (PRINIVIL,ZESTRIL) 20 MG tablet, Take 20 mg by mouth daily., Disp: , Rfl:  .  metFORMIN (GLUCOPHAGE) 500 MG tablet, Take 500 mg by mouth 2  (two) times daily with a meal., Disp: , Rfl:  .  OLANZapine (ZYPREXA) 20 MG tablet, Take 20 mg by mouth at bedtime., Disp: , Rfl:  .  omeprazole (PRILOSEC) 20 MG capsule, Take 20 mg by mouth daily., Disp: , Rfl:  .  oxyCODONE-acetaminophen (PERCOCET) 7.5-325 MG per tablet, Take 1 tablet by mouth every 4 (four) hours as needed for moderate pain., Disp: 60 tablet, Rfl: 0 .  zolpidem (AMBIEN) 10 MG tablet, Take 10 mg by mouth at bedtime., Disp: , Rfl:   There are no active problems to display for this patient.   Allergies  Allergen Reactions  . Hydrocodone Itching    hives    Physical exam:   pupils are equally round and reactive to light  Extraocular muscles are intact   Heart is regular rate and rhythm  Lower extremity strength and function remains at baseline with no significant changes noted on examination today.  Assessment:  #1 chronic low back pain that is intractable with facet arthropathy   #2 chronic opioid management  #3 history of suicidal ideation  #4 history of polyneuropathy  Plan:  I have had a long discussion with patient today. Based on her complicated history and failure to gain sustained relief with the last facet block I'm going to start her on Percocet tablets 5 mg 1 by mouth twice a day. I want her to continue following up with her psychiatrist. At  present she denies any suicidal ideation. I want her also to continue with her efforts at a neurosurgical consultation and I believe this is due within the next few days at [facility] lastly we will plan on a facet block at her next visit in one month.  Dr. Gwenyth Bender 3:34 PM

## 2015-05-27 ENCOUNTER — Other Ambulatory Visit: Payer: Self-pay | Admitting: Anesthesiology

## 2016-09-29 DIAGNOSIS — M545 Low back pain: Secondary | ICD-10-CM | POA: Diagnosis not present

## 2016-09-29 DIAGNOSIS — Z0000 Encounter for general adult medical examination without abnormal findings: Secondary | ICD-10-CM | POA: Diagnosis not present

## 2016-09-29 DIAGNOSIS — Z13220 Encounter for screening for lipoid disorders: Secondary | ICD-10-CM | POA: Diagnosis not present

## 2016-09-29 DIAGNOSIS — E119 Type 2 diabetes mellitus without complications: Secondary | ICD-10-CM | POA: Diagnosis not present

## 2016-10-08 DIAGNOSIS — K047 Periapical abscess without sinus: Secondary | ICD-10-CM | POA: Diagnosis not present

## 2016-10-27 DIAGNOSIS — M542 Cervicalgia: Secondary | ICD-10-CM | POA: Diagnosis not present

## 2016-10-27 DIAGNOSIS — Z981 Arthrodesis status: Secondary | ICD-10-CM | POA: Diagnosis not present

## 2016-10-27 DIAGNOSIS — R200 Anesthesia of skin: Secondary | ICD-10-CM | POA: Insufficient documentation

## 2016-10-27 DIAGNOSIS — R202 Paresthesia of skin: Secondary | ICD-10-CM | POA: Insufficient documentation

## 2016-10-27 DIAGNOSIS — Z720 Tobacco use: Secondary | ICD-10-CM | POA: Insufficient documentation

## 2016-11-06 DIAGNOSIS — I10 Essential (primary) hypertension: Secondary | ICD-10-CM | POA: Diagnosis not present

## 2016-11-17 DIAGNOSIS — M542 Cervicalgia: Secondary | ICD-10-CM | POA: Diagnosis not present

## 2016-11-17 DIAGNOSIS — R200 Anesthesia of skin: Secondary | ICD-10-CM | POA: Diagnosis not present

## 2016-11-17 DIAGNOSIS — R202 Paresthesia of skin: Secondary | ICD-10-CM | POA: Diagnosis not present

## 2016-11-23 DIAGNOSIS — E876 Hypokalemia: Secondary | ICD-10-CM | POA: Diagnosis not present

## 2017-06-01 DIAGNOSIS — Z23 Encounter for immunization: Secondary | ICD-10-CM | POA: Diagnosis not present

## 2017-10-27 ENCOUNTER — Emergency Department: Payer: 59

## 2017-10-27 ENCOUNTER — Encounter: Payer: Self-pay | Admitting: Emergency Medicine

## 2017-10-27 ENCOUNTER — Other Ambulatory Visit: Payer: Self-pay

## 2017-10-27 ENCOUNTER — Emergency Department
Admission: EM | Admit: 2017-10-27 | Discharge: 2017-10-27 | Disposition: A | Discharge status: Home / Self Care | Payer: 59 | Attending: Emergency Medicine | Admitting: Emergency Medicine

## 2017-10-27 DIAGNOSIS — R05 Cough: Secondary | ICD-10-CM | POA: Diagnosis not present

## 2017-10-27 DIAGNOSIS — E119 Type 2 diabetes mellitus without complications: Secondary | ICD-10-CM | POA: Diagnosis not present

## 2017-10-27 DIAGNOSIS — J4541 Moderate persistent asthma with (acute) exacerbation: Secondary | ICD-10-CM

## 2017-10-27 DIAGNOSIS — J45909 Unspecified asthma, uncomplicated: Secondary | ICD-10-CM | POA: Diagnosis present

## 2017-10-27 DIAGNOSIS — F17210 Nicotine dependence, cigarettes, uncomplicated: Secondary | ICD-10-CM | POA: Insufficient documentation

## 2017-10-27 DIAGNOSIS — Z79899 Other long term (current) drug therapy: Secondary | ICD-10-CM | POA: Diagnosis not present

## 2017-10-27 DIAGNOSIS — R0602 Shortness of breath: Secondary | ICD-10-CM | POA: Diagnosis not present

## 2017-10-27 DIAGNOSIS — J4531 Mild persistent asthma with (acute) exacerbation: Secondary | ICD-10-CM | POA: Diagnosis not present

## 2017-10-27 DIAGNOSIS — R0600 Dyspnea, unspecified: Secondary | ICD-10-CM | POA: Diagnosis not present

## 2017-10-27 MED ORDER — LORAZEPAM 2 MG/ML IJ SOLN
0.5000 mg | Freq: Once | INTRAMUSCULAR | Status: AC
  Administered 2017-10-27: 0.5 mg via INTRAVENOUS
  Filled 2017-10-27: qty 1

## 2017-10-27 MED ORDER — IPRATROPIUM-ALBUTEROL 0.5-2.5 (3) MG/3ML IN SOLN
3.0000 mL | Freq: Once | RESPIRATORY_TRACT | Status: AC
  Filled 2017-10-27: qty 3

## 2017-10-27 MED ORDER — ALBUTEROL (5 MG/ML) CONTINUOUS INHALATION SOLN
5.0000 mg/h | INHALATION_SOLUTION | Freq: Once | RESPIRATORY_TRACT | Status: DC
  Filled 2017-10-27: qty 20

## 2017-10-27 MED ORDER — METHYLPREDNISOLONE SODIUM SUCC 125 MG IJ SOLR
125.0000 mg | Freq: Once | INTRAMUSCULAR | Status: AC
  Administered 2017-10-27: 125 mg via INTRAVENOUS
  Filled 2017-10-27: qty 2

## 2017-10-27 MED ORDER — IPRATROPIUM-ALBUTEROL 0.5-2.5 (3) MG/3ML IN SOLN
3.0000 mL | Freq: Once | RESPIRATORY_TRACT | Status: AC
  Administered 2017-10-27: 3 mL via RESPIRATORY_TRACT
  Filled 2017-10-27: qty 3

## 2017-10-27 MED ORDER — PREDNISONE 10 MG (48) PO TBPK: ORAL_TABLET | ORAL | 0 refills | Status: DC

## 2017-10-27 MED ORDER — GUAIFENESIN-CODEINE 100-10 MG/5ML PO SOLN: 5.0000 mL | ORAL | 0 refills | Status: DC | PRN

## 2017-10-27 MED ORDER — TRAMADOL HCL 50 MG PO TABS
50.0000 mg | ORAL_TABLET | Freq: Once | ORAL | Status: AC
  Administered 2017-10-27: 50 mg via ORAL
  Filled 2017-10-27: qty 1

## 2017-10-27 MED ORDER — IPRATROPIUM-ALBUTEROL 0.5-2.5 (3) MG/3ML IN SOLN
RESPIRATORY_TRACT | Status: AC
  Filled 2017-10-27: qty 3

## 2017-10-27 MED ORDER — ALBUTEROL SULFATE (2.5 MG/3ML) 0.083% IN NEBU
INHALATION_SOLUTION | RESPIRATORY_TRACT | Status: AC
Start: 2017-10-27 — End: 2017-10-27
  Administered 2017-10-27: 5 mg
  Filled 2017-10-27: qty 6

## 2017-10-27 NOTE — ED Notes (Signed)
Patient transported to X-ray 

## 2017-10-27 NOTE — Discharge Instructions (Signed)
Follow-up with your regular doctor if you are not better in 3 days.  Return to emergency department if you are having any increased difficulty breathing.  Use the steroid pack as instructed.  You should start this medication on Sunday.  If you become very short of breath please call 911.

## 2017-10-27 NOTE — ED Provider Notes (Signed)
[facility] Emergency Department Provider Note  ____________________________________________   First MD Initiated Contact with Patient 10/27/17 [phone]     (approximate)  I have reviewed the triage vital signs and the nursing notes.   HISTORY  Chief Complaint Cough and Asthma    HPI Katrina Booker is a 42 y.o. female complains of difficulty breathing.  States she has history of asthma.  She has had multiple breathing treatments at home.  She states she just feels like she cannot breathe.  She denies any pain in the chest.  She is worried she has  pneumonia.  She denies fever or chills.  She did have cold symptoms prior to this exasperation of her asthma.  Past Medical History:  Diagnosis Date  . ADHD (attention deficit hyperactivity disorder)   . Asthma   . Bipolar 1 disorder (HCC)   . Diabetes mellitus   . GERD (gastroesophageal reflux disease)     There are no active problems to display for this patient.   Past Surgical History:  Procedure Laterality Date  . ABDOMINAL HYSTERECTOMY    . ABDOMINAL SURGERY    . BACK SURGERY    . HERNIA REPAIR    . INCONTINENCE SURGERY    . OTHER SURGICAL HISTORY     tumor excision on face  . TUBAL LIGATION      Prior to Admission medications   Medication Sig Start Date End Date Taking? Authorizing Provider  albuterol (PROVENTIL HFA;VENTOLIN HFA) 108 (90 BASE) MCG/ACT inhaler Inhale 2 puffs into the lungs every 6 (six) hours as needed. For ashtma    [provider]  ALPRAZolam Prudy Feeler(XANAX) 0.5 MG tablet Take 0.5 mg by mouth at bedtime as needed for anxiety.    [provider]  amphetamine-dextroamphetamine (ADDERALL XR) 20 MG 24 hr capsule Take 20-40 mg by mouth every morning. Patient takes 2 in the morning and 1 in the afternoon    [provider]  budesonide-formoterol (SYMBICORT) 80-4.5 MCG/ACT inhaler Inhale 2 puffs into the lungs 2 (two) times daily.    [provider]    guaiFENesin-codeine 100-10 MG/5ML syrup Take 5 mLs by mouth every 4 (four) hours as needed for cough. 10/27/17   Sherrie MustacheFisher, [name], PA-C  lamoTRIgine (LAMICTAL) 200 MG tablet Take 200 mg by mouth daily.    [provider]  lisinopril (PRINIVIL,ZESTRIL) 20 MG tablet Take 20 mg by mouth daily.    [provider]  metFORMIN (GLUCOPHAGE) 500 MG tablet Take 500 mg by mouth 2 (two) times daily with a meal.    [provider]  OLANZapine (ZYPREXA) 20 MG tablet Take 20 mg by mouth at bedtime.    [provider]  omeprazole (PRILOSEC) 20 MG capsule Take 20 mg by mouth daily.    [provider]  oxyCODONE-acetaminophen (PERCOCET) 7.5-325 MG per tablet Take 1 tablet by mouth every 4 (four) hours as needed for moderate pain. 05/10/15   Yevette EdwardsAdams, James G, MD  predniSONE (STERAPRED UNI-PAK 48 TAB) 10 MG (48) TBPK tablet Take 6 pills for 2 days then decrease by 1 pill every 2 days 10/27/17   Faythe GheeFisher, Susan W, PA-C  zolpidem (AMBIEN) 10 MG tablet Take 10 mg by mouth at bedtime.    [provider]    Allergies Hydrocodone  Family History  Problem Relation Age of Onset  . Hypothyroidism Mother   . Diabetes Father   . Heart disease Father     Social History Social History  Tobacco Use  . Smoking status: Current Every Day Smoker    Packs/day: 1.00    Types: Cigarettes  Substance Use Topics  . Alcohol use: No    Alcohol/week: 0.0 oz  . Drug use: No    Review of Systems  Constitutional: No fever/chills Eyes: No visual changes. ENT: No sore throat. Respiratory: Positive for difficulty breathing, wheezing, and cough Genitourinary: Negative for dysuria. Musculoskeletal: Positive for back pain due to difficulty breathing. Skin: Negative for rash.    ____________________________________________   PHYSICAL EXAM:  VITAL SIGNS: ED Triage Vitals  Enc Vitals Group     BP 10/27/17 0925 122/76     Pulse Rate 10/27/17 0925 98     Resp 10/27/17 0925  (!) 22     Temp 10/27/17 0925 98.1 F (36.7 C)     Temp Source 10/27/17 0925 Oral     SpO2 10/27/17 0925 98 %     Weight 10/27/17 0926 230 lb (104.3 kg)     Height 10/27/17 0926 5\' 3"  (1.6 m)     Head Circumference --      Peak Flow --      Pain Score 10/27/17 0926 6     Pain Loc --      Pain Edu? --      Excl. in GC? --     Constitutional: Alert and oriented. Well appearing and in mild  distress. Eyes: Conjunctivae are normal.  Head: Atraumatic. Nose: No congestion/rhinnorhea. Mouth/Throat: Mucous membranes are moist.   Cardiovascular: Normal rate, regular rhythm.  Heart sounds are normal Respiratory: Normal respiratory effort.  No retractions, lungs with decreased air movement, patient appears to be struggling.  No retractions are noted at this time GU: deferred Musculoskeletal: FROM all extremities, warm and well perfused Neurologic:  Normal speech and language.  Skin:  Skin is warm, dry and intact. No rash noted. Psychiatric: Mood and affect are normal. Speech and behavior are normal.  ____________________________________________   LABS (all labs ordered are listed, but only abnormal results are displayed)  Labs Reviewed - No data to display ____________________________________________   ____________________________________________  RADIOLOGY  Chest x-ray is negative  ____________________________________________   PROCEDURES  Procedure(s) performed: DuoNeb, Solu-Medrol 125 mg IV , second DuoNeb was given.  Continuous 5 mg/h of albuterol Procedures    ____________________________________________   INITIAL IMPRESSION / ASSESSMENT AND PLAN / ED COURSE  Pertinent labs & imaging results that were available during my care of the patient were reviewed by me and considered in my medical decision making (see chart for details).  Patient is 42 year old female complaining of difficulty breathing.  She has had multiple breathing treatments at home.  She normally  takes albuterol and Pulmicort.  She is concerned about pneumonia.  She is not on birth control.  She is a smoker she denies ever being intubated on BiPAP for her asthma.   On physical exam the patient's respirations are elevated.  She appears to be having difficulty breathing.  DuoNeb was given.  Patient is still tight and wheezing.  Solu-Medrol 125 mg IV was given.  Chest x-ray is negative for pneumonia.  Second DuoNeb was given    ----------------------------------------- 12:33 PM on 10/27/2017 -----------------------------------------  Patient is still wheezing and appears to be struggling.  The Ativan does not help with anxiety.  She is complaining of pain in the ribs.  Discussed this with Dr. Alphonzo Lemmings.  He said to do continuous albuterol 5,  Dr Alphonzo Lemmings in to see the patient at  bedside.  He agrees with treatment plan at this time.  He states that if she clears she can go home.  If not then she will need to be admitted ----------------------------------------- 1:27 PM on 10/27/2017 -----------------------------------------  Patient appears better after the albuterol 5.  We will hold her for another 30 minutes to make sure that she is still comfortable.  If she develops more wheezing than we will discuss admission to the hospital  ----------------------------------------- 2:10 PM on 10/27/2017 -----------------------------------------  On reevaluation.  The patient appears much more comfortable.  There is no wheezing at this time.  She states she would just like to go home at this time.  She is refusing admission to the hospital.  She was given a prescription for Sterapred DS 10 mg 12-day Dosepak.  She is to start this medication tomorrow.  She was given strict instructions to return to the emergency department if she has any more difficulty breathing.  Explained to her that if we cannot control the asthma in outpatient setting that she would need to be admitted.  The patient states that she  understands and they will return if she is getting worse.  Husband agrees with treatment plan as well.  As part of my medical decision making, I reviewed the following data within the electronic MEDICAL RECORD NUMBER Nursing notes reviewed and incorporated, Old chart reviewed, Radiograph reviewed chest x-rays negative for pneumonia, Evaluated by EM attending Dr. Alphonzo Lemmings, Notes from prior ED visits and [place] Controlled Substance Database  ____________________________________________   FINAL CLINICAL IMPRESSION(S) / ED DIAGNOSES  Final diagnoses:  Moderate persistent asthma with exacerbation      NEW MEDICATIONS STARTED DURING THIS VISIT:  Discharge Medication List as of 10/27/2017  1:49 PM    START taking these medications   Details  predniSONE (STERAPRED UNI-PAK 48 TAB) 10 MG (48) TBPK tablet Take 6 pills for 2 days then decrease by 1 pill every 2 days, Print         Note:  This document was prepared using Dragon voice recognition software and may include unintentional dictation errors.    [name], PA-C 10/27/17 1418    [name], PA-C 10/27/17 1518    Jeanmarie Plant, MD 10/27/17 [phone]

## 2017-10-27 NOTE — ED Triage Notes (Signed)
Cough and sneezing x 2 weeks, increased inhaler use yesterday. Speaking full sentences.

## 2017-10-27 NOTE — ED Notes (Signed)
OTC inhaler used at home with no relief

## 2017-11-02 DIAGNOSIS — E119 Type 2 diabetes mellitus without complications: Secondary | ICD-10-CM | POA: Diagnosis not present

## 2017-11-02 DIAGNOSIS — G629 Polyneuropathy, unspecified: Secondary | ICD-10-CM | POA: Diagnosis not present

## 2017-11-02 DIAGNOSIS — I10 Essential (primary) hypertension: Secondary | ICD-10-CM | POA: Diagnosis not present

## 2017-11-09 DIAGNOSIS — M47817 Spondylosis without myelopathy or radiculopathy, lumbosacral region: Secondary | ICD-10-CM | POA: Insufficient documentation

## 2017-11-09 DIAGNOSIS — M47812 Spondylosis without myelopathy or radiculopathy, cervical region: Secondary | ICD-10-CM | POA: Diagnosis not present

## 2017-11-27 DIAGNOSIS — M47817 Spondylosis without myelopathy or radiculopathy, lumbosacral region: Secondary | ICD-10-CM | POA: Diagnosis not present

## 2017-12-21 DIAGNOSIS — I10 Essential (primary) hypertension: Secondary | ICD-10-CM | POA: Diagnosis not present

## 2017-12-21 DIAGNOSIS — E119 Type 2 diabetes mellitus without complications: Secondary | ICD-10-CM | POA: Diagnosis not present

## 2017-12-26 DIAGNOSIS — T148XXA Other injury of unspecified body region, initial encounter: Secondary | ICD-10-CM | POA: Diagnosis not present

## 2017-12-26 DIAGNOSIS — E119 Type 2 diabetes mellitus without complications: Secondary | ICD-10-CM | POA: Diagnosis not present

## 2018-01-03 DIAGNOSIS — M549 Dorsalgia, unspecified: Secondary | ICD-10-CM | POA: Diagnosis not present

## 2018-01-03 DIAGNOSIS — Z9889 Other specified postprocedural states: Secondary | ICD-10-CM | POA: Diagnosis not present

## 2018-01-03 DIAGNOSIS — M5117 Intervertebral disc disorders with radiculopathy, lumbosacral region: Secondary | ICD-10-CM | POA: Diagnosis not present

## 2018-01-03 DIAGNOSIS — M545 Low back pain: Secondary | ICD-10-CM | POA: Diagnosis not present

## 2018-03-06 DIAGNOSIS — M9983 Other biomechanical lesions of lumbar region: Secondary | ICD-10-CM | POA: Diagnosis not present

## 2018-03-06 DIAGNOSIS — M5137 Other intervertebral disc degeneration, lumbosacral region: Secondary | ICD-10-CM | POA: Diagnosis not present

## 2018-03-06 DIAGNOSIS — M5136 Other intervertebral disc degeneration, lumbar region: Secondary | ICD-10-CM | POA: Diagnosis not present

## 2018-03-06 DIAGNOSIS — M545 Low back pain: Secondary | ICD-10-CM | POA: Diagnosis not present

## 2018-03-06 DIAGNOSIS — M4807 Spinal stenosis, lumbosacral region: Secondary | ICD-10-CM | POA: Insufficient documentation

## 2018-03-06 DIAGNOSIS — M79605 Pain in left leg: Secondary | ICD-10-CM | POA: Insufficient documentation

## 2018-03-06 DIAGNOSIS — Z9889 Other specified postprocedural states: Secondary | ICD-10-CM | POA: Insufficient documentation

## 2018-05-11 DIAGNOSIS — Z23 Encounter for immunization: Secondary | ICD-10-CM | POA: Diagnosis not present

## 2018-06-17 DIAGNOSIS — J45909 Unspecified asthma, uncomplicated: Secondary | ICD-10-CM | POA: Diagnosis not present

## 2018-06-17 DIAGNOSIS — Z01818 Encounter for other preprocedural examination: Secondary | ICD-10-CM | POA: Diagnosis not present

## 2018-06-17 DIAGNOSIS — K432 Incisional hernia without obstruction or gangrene: Secondary | ICD-10-CM | POA: Diagnosis not present

## 2018-06-17 DIAGNOSIS — I10 Essential (primary) hypertension: Secondary | ICD-10-CM | POA: Diagnosis not present

## 2018-06-17 DIAGNOSIS — M4317 Spondylolisthesis, lumbosacral region: Secondary | ICD-10-CM | POA: Diagnosis not present

## 2018-07-01 DIAGNOSIS — M5137 Other intervertebral disc degeneration, lumbosacral region: Secondary | ICD-10-CM | POA: Diagnosis not present

## 2018-07-01 DIAGNOSIS — M545 Low back pain: Secondary | ICD-10-CM | POA: Diagnosis not present

## 2018-07-01 DIAGNOSIS — Z9889 Other specified postprocedural states: Secondary | ICD-10-CM | POA: Diagnosis not present

## 2018-07-01 DIAGNOSIS — M47817 Spondylosis without myelopathy or radiculopathy, lumbosacral region: Secondary | ICD-10-CM | POA: Diagnosis not present

## 2018-07-01 DIAGNOSIS — Z6841 Body Mass Index (BMI) 40.0 and over, adult: Secondary | ICD-10-CM | POA: Diagnosis not present

## 2018-07-01 DIAGNOSIS — M79605 Pain in left leg: Secondary | ICD-10-CM | POA: Diagnosis not present

## 2018-07-17 DIAGNOSIS — Z4889 Encounter for other specified surgical aftercare: Secondary | ICD-10-CM | POA: Diagnosis not present

## 2018-07-18 DIAGNOSIS — Z0000 Encounter for general adult medical examination without abnormal findings: Secondary | ICD-10-CM | POA: Diagnosis not present

## 2018-07-18 DIAGNOSIS — Z124 Encounter for screening for malignant neoplasm of cervix: Secondary | ICD-10-CM | POA: Diagnosis not present

## 2018-07-22 ENCOUNTER — Other Ambulatory Visit: Payer: Self-pay | Admitting: Physician Assistant

## 2018-07-22 MED ORDER — OLANZAPINE 10 MG PO TABS: 10.0000 mg | ORAL_TABLET | Freq: Every day | ORAL | 1 refills | Status: DC

## 2018-07-23 DIAGNOSIS — E119 Type 2 diabetes mellitus without complications: Secondary | ICD-10-CM | POA: Diagnosis not present

## 2018-07-24 DIAGNOSIS — F909 Attention-deficit hyperactivity disorder, unspecified type: Secondary | ICD-10-CM | POA: Insufficient documentation

## 2018-07-24 DIAGNOSIS — G4700 Insomnia, unspecified: Secondary | ICD-10-CM

## 2018-07-30 ENCOUNTER — Other Ambulatory Visit: Payer: Self-pay | Admitting: Family Medicine

## 2018-07-30 DIAGNOSIS — Z1231 Encounter for screening mammogram for malignant neoplasm of breast: Secondary | ICD-10-CM

## 2018-08-06 DIAGNOSIS — I10 Essential (primary) hypertension: Secondary | ICD-10-CM | POA: Diagnosis not present

## 2018-08-09 ENCOUNTER — Telehealth: Payer: Self-pay | Admitting: Physician Assistant

## 2018-08-09 ENCOUNTER — Ambulatory Visit: Payer: Self-pay | Admitting: Physician Assistant

## 2018-08-09 DIAGNOSIS — Z9889 Other specified postprocedural states: Secondary | ICD-10-CM | POA: Diagnosis not present

## 2018-08-09 NOTE — Telephone Encounter (Signed)
I need her chart please.

## 2018-08-09 NOTE — Telephone Encounter (Signed)
Could not make appointment due to to  stuck in traffic in [place]. Would like Ambien and Adderall scripts mailed .

## 2018-08-12 ENCOUNTER — Other Ambulatory Visit: Payer: Self-pay | Admitting: Physician Assistant

## 2018-08-25 ENCOUNTER — Other Ambulatory Visit: Payer: Self-pay | Admitting: Physician Assistant

## 2018-08-26 ENCOUNTER — Other Ambulatory Visit: Payer: Self-pay | Admitting: Physician Assistant

## 2018-08-26 MED ORDER — AMPHETAMINE-DEXTROAMPHET ER 25 MG PO CP24: 25.0000 mg | ORAL_CAPSULE | Freq: Every day | ORAL | 0 refills | Status: DC

## 2018-08-26 MED ORDER — AMPHETAMINE-DEXTROAMPHET ER 30 MG PO CP24: 30.0000 mg | ORAL_CAPSULE | Freq: Every day | ORAL | 0 refills | Status: DC

## 2018-08-29 ENCOUNTER — Ambulatory Visit
Admission: RE | Admit: 2018-08-29 | Discharge: 2018-08-29 | Disposition: A | Discharge status: Home / Self Care | Payer: 59 | Source: Ambulatory Visit | Attending: Family Medicine | Admitting: Family Medicine

## 2018-08-29 DIAGNOSIS — Z1231 Encounter for screening mammogram for malignant neoplasm of breast: Secondary | ICD-10-CM

## 2018-09-16 ENCOUNTER — Telehealth: Payer: Self-pay | Admitting: Physician Assistant

## 2018-09-16 NOTE — Telephone Encounter (Signed)
I don't see a pharmacy listed.  Please get that and I will send in next week. Thanks

## 2018-09-16 NOTE — Telephone Encounter (Signed)
[facility]  Thank you!

## 2018-09-16 NOTE — Telephone Encounter (Signed)
Pt request refill on Ambien XR 35 mg and Ambien XR 25 mg. Same pharm as last time.

## 2018-09-22 ENCOUNTER — Other Ambulatory Visit: Payer: Self-pay

## 2018-09-22 ENCOUNTER — Emergency Department
Admission: EM | Admit: 2018-09-22 | Discharge: 2018-09-22 | Disposition: A | Discharge status: Home / Self Care | Payer: 59 | Attending: Emergency Medicine | Admitting: Emergency Medicine

## 2018-09-22 ENCOUNTER — Emergency Department: Payer: 59

## 2018-09-22 DIAGNOSIS — W108XXA Fall (on) (from) other stairs and steps, initial encounter: Secondary | ICD-10-CM | POA: Insufficient documentation

## 2018-09-22 DIAGNOSIS — Y998 Other external cause status: Secondary | ICD-10-CM | POA: Diagnosis not present

## 2018-09-22 DIAGNOSIS — J45909 Unspecified asthma, uncomplicated: Secondary | ICD-10-CM | POA: Diagnosis not present

## 2018-09-22 DIAGNOSIS — S4992XA Unspecified injury of left shoulder and upper arm, initial encounter: Secondary | ICD-10-CM | POA: Diagnosis not present

## 2018-09-22 DIAGNOSIS — Y929 Unspecified place or not applicable: Secondary | ICD-10-CM | POA: Insufficient documentation

## 2018-09-22 DIAGNOSIS — T148XXA Other injury of unspecified body region, initial encounter: Secondary | ICD-10-CM

## 2018-09-22 DIAGNOSIS — Y9301 Activity, walking, marching and hiking: Secondary | ICD-10-CM | POA: Insufficient documentation

## 2018-09-22 DIAGNOSIS — Z79899 Other long term (current) drug therapy: Secondary | ICD-10-CM | POA: Diagnosis not present

## 2018-09-22 DIAGNOSIS — F17210 Nicotine dependence, cigarettes, uncomplicated: Secondary | ICD-10-CM | POA: Diagnosis not present

## 2018-09-22 DIAGNOSIS — Z7984 Long term (current) use of oral hypoglycemic drugs: Secondary | ICD-10-CM | POA: Insufficient documentation

## 2018-09-22 DIAGNOSIS — S59902A Unspecified injury of left elbow, initial encounter: Secondary | ICD-10-CM | POA: Diagnosis not present

## 2018-09-22 DIAGNOSIS — F909 Attention-deficit hyperactivity disorder, unspecified type: Secondary | ICD-10-CM | POA: Insufficient documentation

## 2018-09-22 DIAGNOSIS — E119 Type 2 diabetes mellitus without complications: Secondary | ICD-10-CM | POA: Diagnosis not present

## 2018-09-22 DIAGNOSIS — S50311A Abrasion of right elbow, initial encounter: Secondary | ICD-10-CM | POA: Insufficient documentation

## 2018-09-22 DIAGNOSIS — W19XXXA Unspecified fall, initial encounter: Secondary | ICD-10-CM

## 2018-09-22 DIAGNOSIS — S59901A Unspecified injury of right elbow, initial encounter: Secondary | ICD-10-CM | POA: Diagnosis present

## 2018-09-22 DIAGNOSIS — S5002XA Contusion of left elbow, initial encounter: Secondary | ICD-10-CM | POA: Diagnosis not present

## 2018-09-22 DIAGNOSIS — M25512 Pain in left shoulder: Secondary | ICD-10-CM | POA: Diagnosis not present

## 2018-09-22 DIAGNOSIS — M25522 Pain in left elbow: Secondary | ICD-10-CM | POA: Diagnosis not present

## 2018-09-22 MED ORDER — IBUPROFEN 800 MG PO TABS: 800.0000 mg | ORAL_TABLET | Freq: Three times a day (TID) | ORAL | 0 refills | Status: DC | PRN

## 2018-09-22 MED ORDER — OXYCODONE-ACETAMINOPHEN 5-325 MG PO TABS: 1.0000 | ORAL_TABLET | ORAL | 0 refills | Status: DC | PRN

## 2018-09-22 MED ORDER — OXYCODONE-ACETAMINOPHEN 5-325 MG PO TABS
1.0000 | ORAL_TABLET | Freq: Once | ORAL | Status: AC
  Administered 2018-09-22: 1 via ORAL
  Filled 2018-09-22: qty 1

## 2018-09-22 MED ORDER — KETOROLAC TROMETHAMINE 30 MG/ML IJ SOLN
60.0000 mg | Freq: Once | INTRAMUSCULAR | Status: AC
  Administered 2018-09-22: 60 mg via INTRAMUSCULAR
  Filled 2018-09-22: qty 2

## 2018-09-22 NOTE — Discharge Instructions (Signed)
1.  You may take pain medicines as needed (Motrin/Percocet #15). 2.  Wear sling as needed for comfort. 3.  Apply ice to affected area several times daily to reduce swelling. 4.  Return to the ER for worsening symptoms, persistent vomiting, difficulty breathing or other concerns.

## 2018-09-22 NOTE — ED Provider Notes (Signed)
[facility] Emergency Department Provider Note   ____________________________________________   First MD Initiated Contact with Patient 09/22/18 0542     (approximate)  I have reviewed the triage vital signs and the nursing notes.   HISTORY  Chief Complaint Fall and Back Pain    HPI Katrina Booker is a 42 y.o. female who presents to the ED from home status post fall with left shoulder and elbow pain.  Patient was walking in the dark and stumbled down 7 steps landing onto her left side.  Did not strike head or suffer LOC.  This happened around 3 AM.  Complains of left, nondominant shoulder, clavicle and elbow pain.  Initially patient reported low back pain to the triage nurse but now reports no pain in her back.  Denies headache, vision changes, neck pain, chest pain, shortness of breath, abdominal pain, nausea or vomiting.  Denies dizziness, extremity weakness/numbness/tingling.  Denies use of anticoagulants.   Past Medical History:  Diagnosis Date  . ADHD (attention deficit hyperactivity disorder)   . Asthma   . Bipolar 1 disorder (HCC)   . Diabetes mellitus   . GERD (gastroesophageal reflux disease)     Patient Active Problem List   Diagnosis Date Noted  . Attention deficit hyperactivity disorder (ADHD) 07/24/2018  . Insomnia 07/24/2018    Past Surgical History:  Procedure Laterality Date  . ABDOMINAL HYSTERECTOMY    . ABDOMINAL SURGERY    . BACK SURGERY    . HERNIA REPAIR    . INCONTINENCE SURGERY    . OTHER SURGICAL HISTORY     tumor excision on face  . TUBAL LIGATION      Prior to Admission medications   Medication Sig Start Date End Date Taking? Authorizing Provider  albuterol (PROVENTIL HFA;VENTOLIN HFA) 108 (90 BASE) MCG/ACT inhaler Inhale 2 puffs into the lungs every 6 (six) hours as needed. For ashtma    [provider]  ALPRAZolam Prudy Feeler(XANAX) 0.5 MG tablet Take 0.5 mg by mouth at bedtime as needed for anxiety.     [provider]  amphetamine-dextroamphetamine (ADDERALL XR) 25 MG 24 hr capsule Take 25 mg by mouth every morning.    [provider]  amphetamine-dextroamphetamine (ADDERALL XR) 25 MG 24 hr capsule Take 1 capsule by mouth daily at 12 noon. 08/26/18   [name], [name], PA-C  amphetamine-dextroamphetamine (ADDERALL XR) 30 MG 24 hr capsule Take 30 mg by mouth every morning. Patient takes 2 in the morning and 1 in the afternoon     [provider]  amphetamine-dextroamphetamine (ADDERALL XR) 30 MG 24 hr capsule Take 1 capsule (30 mg total) by mouth daily. 08/26/18   [name], [name], PA-C  budesonide-formoterol (SYMBICORT) 80-4.5 MCG/ACT inhaler Inhale 2 puffs into the lungs 2 (two) times daily.    [provider]  busPIRone (BUSPAR) 15 MG tablet Take 15 mg by mouth 2 (two) times daily.    [provider]  FLUoxetine (PROZAC) 40 MG capsule TAKE ONE CAPSULE BY MOUTH EVERY MORNING 08/26/18   Hurst, [name], PA-C  guaiFENesin-codeine 100-10 MG/5ML syrup Take 5 mLs by mouth every 4 (four) hours as needed for cough. 10/27/17   Fisher, [name], PA-C  ibuprofen (ADVIL,MOTRIN) 800 MG tablet Take 1 tablet (800 mg total) by mouth every 8 (eight) hours as needed for moderate pain. 09/22/18   Irean HongSung, Seniyah Esker J, MD  lamoTRIgine (LAMICTAL) 200 MG tablet TAKE 1 TABLET BY MOUTH TWICE DAILY 08/12/18   [name], Teresa  T, PA-C  lisinopril (PRINIVIL,ZESTRIL) 20 MG tablet Take 20 mg by mouth daily.    [provider]  metFORMIN (GLUCOPHAGE) 500 MG tablet Take 500 mg by mouth 2 (two) times daily with a meal.    [provider]  OLANZapine (ZYPREXA) 10 MG tablet Take 1 tablet (10 mg total) by mouth at bedtime. 07/22/18   Melony Overly T, PA-C  OLANZapine (ZYPREXA) 20 MG tablet Take 20 mg by mouth at bedtime.    [provider]  omeprazole (PRILOSEC) 20 MG capsule Take 20 mg by mouth daily.    [provider]  oxyCODONE-acetaminophen (PERCOCET/ROXICET) 5-325  MG tablet Take 1 tablet by mouth every 4 (four) hours as needed for severe pain. 09/22/18   [name], MD  predniSONE (STERAPRED UNI-PAK 48 TAB) 10 MG (48) TBPK tablet Take 6 pills for 2 days then decrease by 1 pill every 2 days 10/27/17   [name], PA-C  zolpidem (AMBIEN CR) 12.5 MG CR tablet Take 12.5 mg by mouth at bedtime. 05/30/18   [provider]  zolpidem (AMBIEN) 10 MG tablet Take 10 mg by mouth at bedtime.    [provider]    Allergies Amlodipine and Hydrocodone  Family History  Problem Relation Age of Onset  . Hypothyroidism Mother   . Diabetes Father   . Heart disease Father     Social History Social History   Tobacco Use  . Smoking status: Current Every Day Smoker    Packs/day: 1.00    Types: Cigarettes  Substance Use Topics  . Alcohol use: No    Alcohol/week: 0.0 standard drinks  . Drug use: No    Review of Systems  Constitutional: No fever/chills Eyes: No visual changes. ENT: No sore throat. Cardiovascular: Denies chest pain. Respiratory: Denies shortness of breath. Gastrointestinal: No abdominal pain.  No nausea, no vomiting.  No diarrhea.  No constipation. Genitourinary: Negative for dysuria. Musculoskeletal: Positive for left clavicle, shoulder and elbow pain.  Negative for back pain. Skin: Negative for rash. Neurological: Negative for headaches, focal weakness or numbness.   ____________________________________________   PHYSICAL EXAM:  VITAL SIGNS: ED Triage Vitals  Enc Vitals Group     BP 09/22/18 0408 132/73     Pulse Rate 09/22/18 0408 97     Resp 09/22/18 0408 18     Temp 09/22/18 0408 98.1 F (36.7 C)     Temp Source 09/22/18 0408 Oral     SpO2 09/22/18 0408 98 %     Weight 09/22/18 0411 228 lb (103.4 kg)     Height 09/22/18 0411 5\' 3"  (1.6 m)     Head Circumference --      Peak Flow --      Pain Score 09/22/18 0411 7     Pain Loc --      Pain Edu? --      Excl. in GC? --     Constitutional: Alert  and oriented. Well appearing and in mild acute distress. Eyes: Conjunctivae are normal. PERRL. EOMI. Head: Atraumatic. Nose: No congestion/rhinnorhea. Mouth/Throat: Mucous membranes are moist.  Oropharynx non-erythematous. Neck: No stridor.  No cervical spine tenderness to palpation. Cardiovascular: Normal rate, regular rhythm. Grossly normal heart sounds.  Good peripheral circulation. Respiratory: Normal respiratory effort.  No retractions. Lungs CTAB. Gastrointestinal: Soft and nontender. No distention. No abdominal bruits. No CVA tenderness. Musculoskeletal:  LUE: Tenderness to palpation left distal clavicle and anterior shoulder.  No external evidence of trauma.  Limited range  of motion secondary to pain.  Small hematoma adjacent to olecranon.  Limited range of motion of elbow secondary to pain.  2+ radial pulses.  Brisk, less than 5-second capillary refill. No lower extremity tenderness nor edema.  No joint effusions. Neurologic:  Normal speech and language. No gross focal neurologic deficits are appreciated. No gait instability. Skin:  Skin is warm, dry and intact. No rash noted. Psychiatric: Mood and affect are normal. Speech and behavior are normal.  ____________________________________________   LABS (all labs ordered are listed, but only abnormal results are displayed)  Labs Reviewed - No data to display ____________________________________________  EKG  None ____________________________________________  RADIOLOGY  ED MD interpretation: No fracture dislocation of left clavicle/shoulder/elbow  Official radiology report(s): Dg Clavicle Left  Result Date: 09/22/2018 CLINICAL DATA:  Status post fall, with left shoulder pain. Initial encounter. EXAM: LEFT CLAVICLE - 2+ VIEWS COMPARISON:  Chest radiograph performed 10/27/2017 FINDINGS: There is no evidence of fracture or dislocation. The left clavicle appears intact. The left humeral head is seated within the glenoid fossa.  Degenerative change is noted at the left acromioclavicular joint. No significant soft tissue abnormalities are seen. The visualized portions of the left lung are clear. Cervical spinal fusion hardware is noted. IMPRESSION: No evidence of fracture or dislocation. Electronically Signed   By: Roanna RaiderJeffery  Chang M.D.   On: 09/22/2018 04:56   Dg Elbow Complete Left  Result Date: 09/22/2018 CLINICAL DATA:  Status post fall down stairs, with acute onset of left elbow pain. Initial encounter. EXAM: LEFT ELBOW - COMPLETE 3+ VIEW COMPARISON:  None. FINDINGS: There is no evidence of fracture or dislocation. The visualized joint spaces are preserved. No significant joint effusion is identified. The soft tissues are unremarkable in appearance. IMPRESSION: No evidence of fracture or dislocation. Electronically Signed   By: Roanna RaiderJeffery  Chang M.D.   On: 09/22/2018 06:05   Dg Shoulder Left  Result Date: 09/22/2018 CLINICAL DATA:  Status post fall down stairs, with left shoulder pain. Initial encounter. EXAM: LEFT SHOULDER - 2+ VIEW COMPARISON:  None. FINDINGS: There is no evidence of fracture or dislocation. The left humeral head is seated within the glenoid fossa. Degenerative change is noted at the left acromioclavicular joint. No significant soft tissue abnormalities are seen. The visualized portions of the left lung are clear. Cervical spinal fusion hardware is noted. IMPRESSION: No evidence of fracture or dislocation. Electronically Signed   By: Roanna RaiderJeffery  Chang M.D.   On: 09/22/2018 04:57    ____________________________________________   PROCEDURES  Procedure(s) performed: None  Procedures  Critical Care performed: No  ____________________________________________   INITIAL IMPRESSION / ASSESSMENT AND PLAN / ED COURSE  As part of my medical decision making, I reviewed the following data within the electronic MEDICAL RECORD NUMBER Nursing notes reviewed and incorporated, Radiograph reviewed and Notes from prior ED  visits   42 year old female who presents status post mechanical fall with left clavicle, shoulder and elbow pain.  No acute traumatic injuries on x-ray of left clavicle and shoulder.  Will send patient back to x-ray to image left elbow.  Administer IM Toradol and oral Percocet for pain.   Clinical Course as of Sep 22 708  Wynelle LinkSun Sep 22, 2018  [id number] Updated patient of negative elbow x-ray.  Will provide sling, discharge home with analgesia and patient will follow-up with orthopedics as needed.  Strict return precautions given.  Patient and spouse verbalize understanding and agree with plan of care.   [JS]    Clinical Course User  Index [JS] [name], MD     ____________________________________________   FINAL CLINICAL IMPRESSION(S) / ED DIAGNOSES  Final diagnoses:  Fall, initial encounter  Acute pain of left shoulder  Left elbow pain  Hematoma     ED Discharge Orders         Ordered    ibuprofen (ADVIL,MOTRIN) 800 MG tablet  Every 8 hours PRN     09/22/18 0644    oxyCODONE-acetaminophen (PERCOCET/ROXICET) 5-325 MG tablet  Every 4 hours PRN     09/22/18 0644           Note:  This document was prepared using Dragon voice recognition software and may include unintentional dictation errors.    [name], MD 09/22/18 [phone]

## 2018-09-22 NOTE — ED Triage Notes (Signed)
Patient reports walking in the dark and fell down approximately 7 steps and landed flat on her back.  Patient denies loss of consciousness.  Patient reports pain to left clavicle, left elbow and back pain.

## 2018-09-23 ENCOUNTER — Other Ambulatory Visit: Payer: Self-pay | Admitting: Physician Assistant

## 2018-09-23 MED ORDER — AMPHETAMINE-DEXTROAMPHET ER 25 MG PO CP24: 25.0000 mg | ORAL_CAPSULE | Freq: Every day | ORAL | 0 refills | Status: DC

## 2018-09-23 MED ORDER — AMPHETAMINE-DEXTROAMPHET ER 30 MG PO CP24: 30.0000 mg | ORAL_CAPSULE | ORAL | 0 refills | Status: DC

## 2018-10-03 ENCOUNTER — Ambulatory Visit (INDEPENDENT_AMBULATORY_CARE_PROVIDER_SITE_OTHER): Payer: Medicare Other | Admitting: Physician Assistant

## 2018-10-03 ENCOUNTER — Encounter: Payer: Self-pay | Admitting: Physician Assistant

## 2018-10-03 ENCOUNTER — Other Ambulatory Visit: Payer: Self-pay | Admitting: Physician Assistant

## 2018-10-03 DIAGNOSIS — F411 Generalized anxiety disorder: Secondary | ICD-10-CM | POA: Diagnosis not present

## 2018-10-03 DIAGNOSIS — F331 Major depressive disorder, recurrent, moderate: Secondary | ICD-10-CM

## 2018-10-03 DIAGNOSIS — F900 Attention-deficit hyperactivity disorder, predominantly inattentive type: Secondary | ICD-10-CM | POA: Diagnosis not present

## 2018-10-03 MED ORDER — HYDROXYZINE HCL 25 MG PO TABS: 25.0000 mg | ORAL_TABLET | Freq: Three times a day (TID) | ORAL | 1 refills | Status: DC | PRN

## 2018-10-03 MED ORDER — FLUOXETINE HCL 60 MG PO TABS: 60.0000 mg | ORAL_TABLET | Freq: Every day | ORAL | 1 refills | Status: DC

## 2018-10-03 MED ORDER — BUSPIRONE HCL 15 MG PO TABS: 15.0000 mg | ORAL_TABLET | Freq: Three times a day (TID) | ORAL | 1 refills | Status: DC

## 2018-10-03 NOTE — Progress Notes (Signed)
[facility]  Patient ID: Katrina Booker,  MRN: [id number]  PCP: Dorothey Baseman, MD  Date of Evaluation: 10/03/2018 Time spent:15 minutes  Chief Complaint:  Chief Complaint    Anxiety; Follow-up      HISTORY/CURRENT STATUS: HPI Very stressed. She and husband moved in with her mother and father to help them.  Mom has kidney failure at 10, and on peritoneal dialysis.  Also dx w/ alzheimers. Dtr and her boyfriend and their infant son live in her previous house.  Patient states her daughter and her boyfriend do not know how to do anything and the patient is taking care of 2 households.  She is trying to train them how to take care of the baby and the house.  Pt has very low energy/motivation, doesn't enjoy anything, cries easily, wants to sleep all the time, but 'duty calls and I can't.'    Anxiety is so bad the Buspar isn't working and "I need something else for anxiety."  States that when Dr. Tomasa Rand was here he gave her Adderall and Xanax because she needed both.  Says nothing else helps.  The BuSpar is not doing anything.  The anxiety is generalized and she feels a type of nervous energy all the time.  She has racing thoughts.  "I know I cannot control anything but I want to know everything that is going on.  And then I keep worrying about it over and over."  She is not sleeping well unless she takes the Ambien.  Individual Medical History/ Review of Systems: Changes? :No    Past medications for mental health diagnoses include: Chantix, Lamictal, BuSpar, symbyax, Zyprexa, Xanax, Latuda, Effexor, Sonata, Ambien, Prozac, Librium, Wellbutrin, Adderall XR, Cymbalta  Allergies: Amlodipine and Hydrocodone  Current Medications:  Current Outpatient Medications:  .  albuterol (PROVENTIL HFA;VENTOLIN HFA) 108 (90 BASE) MCG/ACT inhaler, Inhale 2 puffs into the lungs every 6 (six) hours as needed. For ashtma, Disp: , Rfl:  .  amphetamine-dextroamphetamine (ADDERALL XR) 25 MG 24  hr capsule, Take 25 mg by mouth every morning., Disp: , Rfl:  .  amphetamine-dextroamphetamine (ADDERALL XR) 30 MG 24 hr capsule, Take 30 mg by mouth every morning. Patient takes 2 in the morning and 1 in the afternoon , Disp: , Rfl:  .  busPIRone (BUSPAR) 15 MG tablet, Take 1 tablet (15 mg total) by mouth 3 (three) times daily., Disp: 90 tablet, Rfl: 1 .  fexofenadine (ALLEGRA) 180 MG tablet, Take by mouth., Disp: , Rfl:  .  Fluticasone Propionate (FLONASE NA), Place into the nose., Disp: , Rfl:  .  gabapentin (NEURONTIN) 800 MG tablet, TAKE 1 TABLET(800 MG) BY MOUTH THREE TIMES DAILY, Disp: , Rfl:  .  ibuprofen (ADVIL,MOTRIN) 800 MG tablet, Take 1 tablet (800 mg total) by mouth every 8 (eight) hours as needed for moderate pain., Disp: 15 tablet, Rfl: 0 .  lamoTRIgine (LAMICTAL) 200 MG tablet, TAKE 1 TABLET BY MOUTH TWICE DAILY, Disp: 180 tablet, Rfl: 0 .  linagliptin (TRADJENTA) 5 MG TABS tablet, TAKE 1 TABLET(5 MG) BY MOUTH EVERY DAY, Disp: , Rfl:  .  lisinopril (PRINIVIL,ZESTRIL) 20 MG tablet, Take 20 mg by mouth daily., Disp: , Rfl:  .  metFORMIN (GLUCOPHAGE) 500 MG tablet, Take 500 mg by mouth 2 (two) times daily with a meal., Disp: , Rfl:  .  OLANZapine (ZYPREXA) 10 MG tablet, Take 1 tablet (10 mg total) by mouth at bedtime., Disp: 90 tablet, Rfl: 1 .  omeprazole (PRILOSEC) 20 MG  capsule, Take 20 mg by mouth daily., Disp: , Rfl:  .  ondansetron (ZOFRAN) 4 MG tablet, Take by mouth., Disp: , Rfl:  .  potassium chloride (K-DUR) 10 MEQ tablet, TAKE 1 TABLET(10 MEQ) BY MOUTH EVERY DAY, Disp: , Rfl:  .  promethazine (PHENERGAN) 25 MG tablet, TAKE 1 TABLET BY MOUTH EVERY 4 HOURS AS NEEDED FOR NAUSEA, Disp: , Rfl:  .  zolpidem (AMBIEN CR) 12.5 MG CR tablet, Take 12.5 mg by mouth at bedtime., Disp: , Rfl:  .  ALPRAZolam (XANAX) 0.5 MG tablet, Take 0.5 mg by mouth at bedtime as needed for anxiety., Disp: , Rfl:  .  amphetamine-dextroamphetamine (ADDERALL XR) 25 MG 24 hr capsule, Take 1 capsule by  mouth daily at 12 noon., Disp: 30 capsule, Rfl: 0 .  amphetamine-dextroamphetamine (ADDERALL XR) 25 MG 24 hr capsule, Take 1 capsule by mouth daily at 12 noon., Disp: 30 capsule, Rfl: 0 .  amphetamine-dextroamphetamine (ADDERALL XR) 30 MG 24 hr capsule, Take 1 capsule (30 mg total) by mouth daily., Disp: 30 capsule, Rfl: 0 .  amphetamine-dextroamphetamine (ADDERALL XR) 30 MG 24 hr capsule, Take 1 capsule (30 mg total) by mouth every morning., Disp: 30 capsule, Rfl: 0 .  budesonide-formoterol (SYMBICORT) 80-4.5 MCG/ACT inhaler, Inhale 2 puffs into the lungs 2 (two) times daily., Disp: , Rfl:  .  FLUoxetine HCl 60 MG TABS, Take 60 mg by mouth daily., Disp: 30 tablet, Rfl: 1 .  guaiFENesin-codeine 100-10 MG/5ML syrup, Take 5 mLs by mouth every 4 (four) hours as needed for cough. (Patient not taking: Reported on 10/03/2018), Disp: 120 mL, Rfl: 0 .  hydrOXYzine (ATARAX/VISTARIL) 25 MG tablet, Take 1 tablet (25 mg total) by mouth 3 (three) times daily as needed., Disp: 60 tablet, Rfl: 1 .  OLANZapine (ZYPREXA) 20 MG tablet, Take 20 mg by mouth at bedtime., Disp: , Rfl:  .  oxyCODONE-acetaminophen (PERCOCET/ROXICET) 5-325 MG tablet, Take 1 tablet by mouth every 4 (four) hours as needed for severe pain. (Patient not taking: Reported on 10/03/2018), Disp: 15 tablet, Rfl: 0 .  predniSONE (STERAPRED UNI-PAK 48 TAB) 10 MG (48) TBPK tablet, Take 6 pills for 2 days then decrease by 1 pill every 2 days (Patient not taking: Reported on 10/03/2018), Disp: 48 tablet, Rfl: 0 .  zolpidem (AMBIEN) 10 MG tablet, Take 10 mg by mouth at bedtime., Disp: , Rfl:  Medication Side Effects: none  Family Medical/ Social History: Changes? Yes  MENTAL HEALTH EXAM:  There were no vitals taken for this visit.There is no height or weight on file to calculate BMI.  General Appearance: Casual, Well Groomed and Obese poor dentition, scarring on right upper lip and face.  Eye Contact:  Good  Speech:  Clear and Coherent  Volume:  Normal   Mood:  Anxious  Affect:  Anxious  Thought Process:  Goal Directed  Orientation:  Full (Time, Place, and Person)  Thought Content: Logical   Suicidal Thoughts:  No  Homicidal Thoughts:  No  Memory:  WNL  Judgement:  Good  Insight:  Good  Psychomotor Activity:  Normal  Concentration:  Concentration: Good  Recall:  Good  Fund of Knowledge: Good  Language: Good  Assets:  Desire for Improvement  ADL's:  Intact  Cognition: WNL  Prognosis:  Good    DIAGNOSES:    ICD-10-CM   1. Generalized anxiety disorder F41.1   2. Major depressive disorder, recurrent episode, moderate (HCC) F33.1     Receiving Psychotherapy: No    RECOMMENDATIONS:  I am not prescribing benzodiazepines and a stimulant at the same time.  She is not in school at the present time so I recommend that we wean her off of the Adderall and we can add back in the Xanax.  She states if she does that then she will not be able to take care of 2 households.  She prefers to stay on the Adderall but states she does not understand why I will not prescribe the Xanax.  I explained that one is an upper and one is a downer and it is office policy that they not be prescribed at the same time. Add hydroxyzine 25 mg 3 times daily as needed anxiety.  Sedation precautions were given. Increase BuSpar to 15 mg p.o. 3 times daily. Increase Prozac to 60 mg p.o. daily. Continue Adderall XR 30 mg every morning. Continue Adderall XR 25 mg in the afternoon. Continue Lamictal 200 mg twice daily. Continue Zyprexa 10 mg p.o. nightly. Strongly recommend psychotherapy.  She states she does not have time to go. Return in 4 to 6 weeks or sooner as needed.    Melony Overly, PA-C

## 2018-10-21 ENCOUNTER — Other Ambulatory Visit: Payer: Self-pay

## 2018-10-21 ENCOUNTER — Telehealth: Payer: Self-pay | Admitting: Physician Assistant

## 2018-10-21 NOTE — Telephone Encounter (Signed)
Trying to also reach pt that her zolpidem 12.5 mg is denied by her insurance. Needs to have tried Lunesta or Temazepam before.

## 2018-10-21 NOTE — Telephone Encounter (Signed)
Pt called to request refill Adderall at [facility] in [place]. Completely out.

## 2018-10-21 NOTE — Telephone Encounter (Signed)
Pt filled ambien 12.5 mg on 10/17/2018 #30 and insurance covered  Also last fill for adderall rx's on 09/23/2018 shouldn't be out yet. Will have provider submit refills

## 2018-10-22 ENCOUNTER — Other Ambulatory Visit: Payer: Self-pay

## 2018-10-22 MED ORDER — AMPHETAMINE-DEXTROAMPHET ER 25 MG PO CP24: 25.0000 mg | ORAL_CAPSULE | Freq: Every day | ORAL | 0 refills | Status: DC

## 2018-10-22 MED ORDER — AMPHETAMINE-DEXTROAMPHET ER 30 MG PO CP24: 30.0000 mg | ORAL_CAPSULE | ORAL | 0 refills | Status: DC

## 2018-10-22 NOTE — Telephone Encounter (Signed)
Spoke with pt and she paid out of pocket for her Zolpidem. Will make provider aware of denial and medications that need to be tried first

## 2018-10-23 NOTE — Telephone Encounter (Signed)
We may need to try Lunesta. Will have to wait 30 day from time Ambien was picked up though.  We can send it in then.

## 2018-10-24 ENCOUNTER — Telehealth: Payer: Self-pay

## 2018-10-24 NOTE — Telephone Encounter (Signed)
Prior authorization submitted for Adderall XR 30mg  approved through [employer] through 10/24/2019 [id number]  Prior authorization submitted for Adderall XR 25mg  approved through [facility] [facility] through 10/24/2019 [id number]  [facility] [address] [place] [phone] Phone [phone]

## 2018-10-24 NOTE — Telephone Encounter (Signed)
Unable to reach pt

## 2018-10-24 NOTE — Telephone Encounter (Signed)
Prior authorization submitted for Zolpidem ER 12.5mg  DENIED per [employer], pt needs to try either Eszopiclone or Temazepam first   [id number]  Pt aware, pt paid out of pocket for Zolpidem this month.

## 2018-11-04 ENCOUNTER — Ambulatory Visit: Payer: Medicare Other | Admitting: Physician Assistant

## 2018-11-15 ENCOUNTER — Other Ambulatory Visit: Payer: Self-pay | Admitting: Physician Assistant

## 2018-11-15 NOTE — Telephone Encounter (Signed)
Insurance won't cover medication. Pay out of pocket or change to lunesta or remeron.

## 2018-11-18 ENCOUNTER — Telehealth: Payer: Self-pay | Admitting: Physician Assistant

## 2018-11-18 ENCOUNTER — Other Ambulatory Visit: Payer: Self-pay

## 2018-11-18 MED ORDER — AMPHETAMINE-DEXTROAMPHET ER 30 MG PO CP24: 30.0000 mg | ORAL_CAPSULE | ORAL | 0 refills | Status: DC

## 2018-11-18 MED ORDER — AMPHETAMINE-DEXTROAMPHET ER 25 MG PO CP24: 25.0000 mg | ORAL_CAPSULE | Freq: Every day | ORAL | 0 refills | Status: DC

## 2018-11-18 NOTE — Telephone Encounter (Signed)
Patient requesting a refill on her Adderall. No noted appts. Would like filled at the [facility]  In [place]. Chart in box.

## 2018-11-18 NOTE — Telephone Encounter (Signed)
Submitted to provider for approval 

## 2018-11-19 NOTE — Telephone Encounter (Signed)
Pt says she tried [place] in 2010 and couldn't take due to taste in her mouth and wasn't effective. Will pull her PA and tried to resubmit with latest information, pt said if not they do cover the regular Zolpidem.

## 2018-11-20 NOTE — Telephone Encounter (Signed)
Appeal submitted through [employer] will wait for decision on prior authorization

## 2018-11-21 ENCOUNTER — Telehealth: Payer: Self-pay

## 2018-11-21 NOTE — Telephone Encounter (Signed)
Appeal approved for Zolpidem 12.5 mg

## 2018-11-21 NOTE — Telephone Encounter (Signed)
Appeal for pt's Zolpidem 12.5 mg submitted, approval came through from [employer], effective 11/20/2018-11/21/2019  [facility] in [place] notified with refills submitted.

## 2018-11-26 ENCOUNTER — Other Ambulatory Visit: Payer: Self-pay | Admitting: Physician Assistant

## 2018-11-27 ENCOUNTER — Other Ambulatory Visit: Payer: Self-pay | Admitting: Physician Assistant

## 2018-12-09 ENCOUNTER — Encounter: Payer: Self-pay | Admitting: Physician Assistant

## 2018-12-09 ENCOUNTER — Other Ambulatory Visit: Payer: Self-pay

## 2018-12-09 ENCOUNTER — Telehealth: Payer: Self-pay | Admitting: Physician Assistant

## 2018-12-09 ENCOUNTER — Ambulatory Visit (INDEPENDENT_AMBULATORY_CARE_PROVIDER_SITE_OTHER): Payer: Medicare Other | Admitting: Physician Assistant

## 2018-12-09 DIAGNOSIS — F900 Attention-deficit hyperactivity disorder, predominantly inattentive type: Secondary | ICD-10-CM | POA: Diagnosis not present

## 2018-12-09 DIAGNOSIS — F411 Generalized anxiety disorder: Secondary | ICD-10-CM

## 2018-12-09 DIAGNOSIS — Z716 Tobacco abuse counseling: Secondary | ICD-10-CM | POA: Diagnosis not present

## 2018-12-09 DIAGNOSIS — F331 Major depressive disorder, recurrent, moderate: Secondary | ICD-10-CM | POA: Diagnosis not present

## 2018-12-09 MED ORDER — AMPHETAMINE-DEXTROAMPHET ER 30 MG PO CP24: 30.0000 mg | ORAL_CAPSULE | Freq: Every day | ORAL | 0 refills | Status: DC

## 2018-12-09 MED ORDER — AMPHETAMINE-DEXTROAMPHET ER 30 MG PO CP24: 30.0000 mg | ORAL_CAPSULE | ORAL | 0 refills | Status: DC

## 2018-12-09 MED ORDER — VARENICLINE TARTRATE 0.5 MG X 11 & 1 MG X 42 PO MISC: ORAL | 0 refills | Status: DC

## 2018-12-09 MED ORDER — AMPHETAMINE-DEXTROAMPHET ER 25 MG PO CP24: 25.0000 mg | ORAL_CAPSULE | Freq: Every day | ORAL | 0 refills | Status: DC

## 2018-12-09 MED ORDER — BUSPIRONE HCL 30 MG PO TABS: 30.0000 mg | ORAL_TABLET | Freq: Two times a day (BID) | ORAL | 1 refills | Status: DC

## 2018-12-09 MED ORDER — FLUOXETINE HCL 40 MG PO CAPS: 80.0000 mg | ORAL_CAPSULE | Freq: Every day | ORAL | 1 refills | Status: DC

## 2018-12-09 NOTE — Progress Notes (Signed)
[facility]  Patient ID: Katrina Booker,  MRN: [id number]  PCP: Dorothey BasemanBronstein, David, MD  Date of Evaluation: 12/09/2018 Time spent:25 minutes  Chief Complaint:  Chief Complaint    Follow-up      HISTORY/CURRENT STATUS: HPIHere for routine med check.  Accompanied by husband, Dorene SorrowJerry  Still dealing with a lot of anxiety.  Hydroxyzine not helping at all.  Just makes her sleepy.  Her Mom has alzheimers and she takes care of her.  Plus taking care of her grandson.  "It's very stressful."  States she feels tense and tight in her neck and shoulders, upper back, and hands all the time.  Her husband has to remind her to relax.  She is not really having panic attacks, it is more of a generalized anxiety that happens all the time.  Patient denies loss of interest in usual activities and is able to enjoy things.  Denies decreased energy or motivation.  Appetite has not changed.  No extreme sadness, tearfulness, or feelings of hopelessness.  States if she does not have the Adderall, she is unable to focus and get anything done.  Her husband states she will be going in 20 different directions all at once.  Denies suicidal or homicidal thoughts.  Patient denies increased energy with decreased need for sleep, no increased talkativeness, no racing thoughts, no impulsivity or risky behaviors, no increased spending, no increased libido, no grandiosity.  She wants to quit smoking.  Ready to use Chantix again.  It's helped in past.  No SE from it, except nausea, if she took it on an empty stomach.  Denies muscle or joint pain, stiffness, or dystonia.  Denies dizziness, syncope, seizures, numbness, tingling, tremor, tics, unsteady gait, slurred speech, confusion.   Individual Medical History/ Review of Systems: Changes? :No    Past medications for mental health diagnoses include: Chantix, Lamictal, BuSpar, symbyax, Zyprexa, Xanax, Latuda, Effexor, Sonata, Ambien, Prozac, Librium, Wellbutrin,  Adderall XR, Cymbalta  Allergies: Amlodipine and Hydrocodone  Current Medications:  Current Outpatient Medications:  .  albuterol (PROVENTIL HFA;VENTOLIN HFA) 108 (90 BASE) MCG/ACT inhaler, Inhale 2 puffs into the lungs every 6 (six) hours as needed. For ashtma, Disp: , Rfl:  .  amphetamine-dextroamphetamine (ADDERALL XR) 25 MG 24 hr capsule, Take 1 capsule by mouth daily at 12 noon., Disp: 30 capsule, Rfl: 0 .  [START ON 12/21/2018] amphetamine-dextroamphetamine (ADDERALL XR) 25 MG 24 hr capsule, Take 1 capsule by mouth daily at 12 noon., Disp: 30 capsule, Rfl: 0 .  [START ON 01/20/2019] amphetamine-dextroamphetamine (ADDERALL XR) 25 MG 24 hr capsule, Take 1 capsule by mouth daily at 12 noon., Disp: 30 capsule, Rfl: 0 .  [START ON 12/21/2018] amphetamine-dextroamphetamine (ADDERALL XR) 30 MG 24 hr capsule, Take 1 capsule (30 mg total) by mouth daily., Disp: 30 capsule, Rfl: 0 .  [START ON 01/20/2019] amphetamine-dextroamphetamine (ADDERALL XR) 30 MG 24 hr capsule, Take 1 capsule (30 mg total) by mouth every morning., Disp: 30 capsule, Rfl: 0 .  fexofenadine (ALLEGRA) 180 MG tablet, Take by mouth., Disp: , Rfl:  .  Fluticasone Propionate (FLONASE NA), Place into the nose., Disp: , Rfl:  .  gabapentin (NEURONTIN) 800 MG tablet, TAKE 1 TABLET(800 MG) BY MOUTH THREE TIMES DAILY, Disp: , Rfl:  .  ibuprofen (ADVIL,MOTRIN) 800 MG tablet, Take 1 tablet (800 mg total) by mouth every 8 (eight) hours as needed for moderate pain., Disp: 15 tablet, Rfl: 0 .  lamoTRIgine (LAMICTAL) 200 MG tablet, TAKE 1 TABLET BY MOUTH  TWICE DAILY, Disp: 180 tablet, Rfl: 0 .  linagliptin (TRADJENTA) 5 MG TABS tablet, TAKE 1 TABLET(5 MG) BY MOUTH EVERY DAY, Disp: , Rfl:  .  lisinopril (PRINIVIL,ZESTRIL) 20 MG tablet, Take 20 mg by mouth daily., Disp: , Rfl:  .  metFORMIN (GLUCOPHAGE) 500 MG tablet, Take 500 mg by mouth 2 (two) times daily with a meal., Disp: , Rfl:  .  OLANZapine (ZYPREXA) 10 MG tablet, Take 1 tablet (10 mg total)  by mouth at bedtime., Disp: 90 tablet, Rfl: 1 .  omeprazole (PRILOSEC) 20 MG capsule, Take 20 mg by mouth daily., Disp: , Rfl:  .  ondansetron (ZOFRAN) 4 MG tablet, Take by mouth., Disp: , Rfl:  .  potassium chloride (K-DUR) 10 MEQ tablet, TAKE 1 TABLET(10 MEQ) BY MOUTH EVERY DAY, Disp: , Rfl:  .  promethazine (PHENERGAN) 25 MG tablet, TAKE 1 TABLET BY MOUTH EVERY 4 HOURS AS NEEDED FOR NAUSEA, Disp: , Rfl:  .  zolpidem (AMBIEN CR) 12.5 MG CR tablet, TAKE 1 TABLET BY MOUTH EVERY NIGHT AT BEDTIME, Disp: 30 tablet, Rfl: 1 .  budesonide-formoterol (SYMBICORT) 80-4.5 MCG/ACT inhaler, Inhale 2 puffs into the lungs 2 (two) times daily., Disp: , Rfl:  .  busPIRone (BUSPAR) 30 MG tablet, Take 1 tablet (30 mg total) by mouth 2 (two) times daily., Disp: 60 tablet, Rfl: 1 .  FLUoxetine (PROZAC) 40 MG capsule, Take 2 capsules (80 mg total) by mouth daily., Disp: 60 capsule, Rfl: 1 .  guaiFENesin-codeine 100-10 MG/5ML syrup, Take 5 mLs by mouth every 4 (four) hours as needed for cough. (Patient not taking: Reported on 10/03/2018), Disp: 120 mL, Rfl: 0 .  oxyCODONE-acetaminophen (PERCOCET/ROXICET) 5-325 MG tablet, Take 1 tablet by mouth every 4 (four) hours as needed for severe pain. (Patient not taking: Reported on 10/03/2018), Disp: 15 tablet, Rfl: 0 .  predniSONE (STERAPRED UNI-PAK 48 TAB) 10 MG (48) TBPK tablet, Take 6 pills for 2 days then decrease by 1 pill every 2 days (Patient not taking: Reported on 10/03/2018), Disp: 48 tablet, Rfl: 0 .  varenicline (CHANTIX STARTING MONTH PAK) 0.5 MG X 11 & 1 MG X 42 tablet, Take one 0.5 mg tablet by mouth once daily for 3 days, then increase to one 0.5 mg tablet twice daily for 4 days, then increase to one 1 mg tablet twice daily., Disp: 53 tablet, Rfl: 0 Medication Side Effects: none  Family Medical/ Social History: Changes?  Moved in with her parents to help take care of her mom who has Alzheimer's.  She also keeps her grandchild almost every day.  MENTAL HEALTH  EXAM:  There were no vitals taken for this visit.There is no height or weight on file to calculate BMI.  General Appearance: Casual and Well Groomed  Eye Contact:  Good  Speech:  whispering.  States she lost her voice yelling at dogs that were fighting.   Volume:  Decreased  Mood:  Euthymic  Affect:  Appropriate  Thought Process:  Goal Directed  Orientation:  Full (Time, Place, and Person)  Thought Content: Logical   Suicidal Thoughts:  No  Homicidal Thoughts:  No  Memory:  WNL  Judgement:  Good  Insight:  Good  Psychomotor Activity:  Normal  Concentration:  Concentration: Good and Attention Span: Good  Recall:  Good  Fund of Knowledge: Good  Language: Good  Assets:  Desire for Improvement  ADL's:  Intact  Cognition: WNL  Prognosis:  Good  Labs on chart through the [employer]  system 08/06/2018 glucose was 160, creatinine was normal, LFTs normal. On 07/23/2018 lipid panel was completely normal.  DIAGNOSES:    ICD-10-CM   1. Generalized anxiety disorder F41.1   2. Major depressive disorder, recurrent episode, moderate (HCC) F33.1   3. Attention deficit hyperactivity disorder (ADHD), predominantly inattentive type F90.0   4. Encounter for smoking cessation counseling Z71.6     Receiving Psychotherapy: No    RECOMMENDATIONS: I spent 25 minutes with her and at least 50% of that time was in counseling discussing smoking cessation, and treatment options for ADHD as well as anxiety. Start Chantix first month pack.  Smoking cessation counseling was provided. Increase Prozac to 80 mg daily. Increase BuSpar to 30 mg twice daily. Continue gabapentin 800 mg 3 times daily per PCP. Continue Lamictal 200 mg twice daily. Continue Zyprexa 10 mg nightly. Continue Ambien CR 12.5 mg nightly as needed.  PDMP was reviewed. Continue Adderall XR 30 mg every morning. Continue Adderall XR 25 mg in the afternoon. Return in 6 weeks.  Melony Overly, PA-C   This record has been  created using [employer].  Chart creation errors have been sought, but may not always have been located and corrected. Such creation errors do not reflect on the standard of medical care.

## 2018-12-09 NOTE — Telephone Encounter (Signed)
I already sent it.  We had discussed it.

## 2018-12-09 NOTE — Telephone Encounter (Signed)
Patient forgot to tell you that she wants to start taking chantix to help her stop smoking. Please send to pharmacy.

## 2019-01-04 ENCOUNTER — Other Ambulatory Visit: Payer: Self-pay | Admitting: Physician Assistant

## 2019-01-15 ENCOUNTER — Telehealth: Payer: Self-pay | Admitting: Physician Assistant

## 2019-01-15 ENCOUNTER — Other Ambulatory Visit: Payer: Self-pay | Admitting: Physician Assistant

## 2019-01-15 MED ORDER — ZOLPIDEM TARTRATE ER 12.5 MG PO TBCR: 12.5000 mg | EXTENDED_RELEASE_TABLET | Freq: Every evening | ORAL | 0 refills | Status: DC | PRN

## 2019-01-15 NOTE — Telephone Encounter (Signed)
Please let her know she already has Adderall Rxs at the drug store she asked me to send them to. She needs to call pharmacy and ask them to fill the Rx.   I sent in a month supply on Ambien. Thanks.

## 2019-01-15 NOTE — Telephone Encounter (Signed)
Patient need refill on Adderall and Ambien sent to [facility] in [facility]

## 2019-01-17 ENCOUNTER — Other Ambulatory Visit: Payer: Self-pay | Admitting: Physician Assistant

## 2019-01-17 ENCOUNTER — Telehealth: Payer: Self-pay | Admitting: Physician Assistant

## 2019-01-17 MED ORDER — ZOLPIDEM TARTRATE ER 12.5 MG PO TBCR: 12.5000 mg | EXTENDED_RELEASE_TABLET | Freq: Every evening | ORAL | 0 refills | Status: DC | PRN

## 2019-01-17 NOTE — Telephone Encounter (Signed)
I sent [facility] to [facility] as requested. And cancelled Rx at [facility].

## 2019-01-17 NOTE — Telephone Encounter (Signed)
Patient stated they did not have Ambien in stock at [facility] in [place] but they [facility] [address], phone number [phone] they were not able to transfer script [facility]

## 2019-01-20 ENCOUNTER — Encounter: Payer: Self-pay | Admitting: Physician Assistant

## 2019-01-20 ENCOUNTER — Other Ambulatory Visit: Payer: Self-pay

## 2019-01-20 ENCOUNTER — Ambulatory Visit (INDEPENDENT_AMBULATORY_CARE_PROVIDER_SITE_OTHER): Payer: Medicare Other | Admitting: Physician Assistant

## 2019-01-20 DIAGNOSIS — F900 Attention-deficit hyperactivity disorder, predominantly inattentive type: Secondary | ICD-10-CM

## 2019-01-20 DIAGNOSIS — F411 Generalized anxiety disorder: Secondary | ICD-10-CM | POA: Diagnosis not present

## 2019-01-20 DIAGNOSIS — F331 Major depressive disorder, recurrent, moderate: Secondary | ICD-10-CM

## 2019-01-20 DIAGNOSIS — Z716 Tobacco abuse counseling: Secondary | ICD-10-CM

## 2019-01-20 MED ORDER — PROPRANOLOL HCL 20 MG PO TABS: ORAL_TABLET | ORAL | 1 refills | Status: DC

## 2019-01-20 NOTE — Progress Notes (Signed)
[facility]  Patient ID: Katrina Booker,  MRN: [id number]  PCP: Dorothey Baseman, MD  Date of Evaluation: 01/20/19 Time spent:15 minutes  Chief Complaint:  Chief Complaint    Follow-up     Virtual Visit via Telephone Note  I connected with patient by a video enabled telemedicine application or telephone, with their informed consent, and verified patient privacy and that I am speaking with the correct person using two identifiers.  I am private, in my home and the patient is home.  I discussed the limitations, risks, security and privacy concerns of performing an evaluation and management service by telephone and the availability of in person appointments. I also discussed with the patient that there may be a patient responsible charge related to this service. The patient expressed understanding and agreed to proceed.   I discussed the assessment and treatment plan with the patient. The patient was provided an opportunity to ask questions and all were answered. The patient agreed with the plan and demonstrated an understanding of the instructions.   The patient was advised to call back or seek an in-person evaluation if the symptoms worsen or if the condition fails to improve as anticipated.  I provided 15 minutes of non-face-to-face time during this encounter.  HISTORY/CURRENT STATUS: HPI  For 6 week f/u.  On 12/09/2018 we increased Prozac to 80 mg p.o. daily and BuSpar 30 mg 2 twice daily.  Patient states that has not helped the anxiety at all.  She feels a continuous sense of urgency and impending doom, she describes as nervous with occasional panic attacks that can last anywhere from a few minutes to an hour.  When she has the panic attacks, she has palpitations and chest tightness, shortness of breath, jitteriness all over and sweaty palms.  Again it usually goes away within about an hour and sometimes it can only last for about 15 minutes.  States it feels like she is  dying.  Patient denies loss of interest in usual activities and is able to enjoy things.  Denies decreased energy or motivation.  Appetite has not changed.  No extreme sadness, tearfulness, or feelings of hopelessness.  Denies any changes in concentration, making decisions or remembering things.  Denies suicidal or homicidal thoughts.  States that attention is good without easy distractibility.  Able to focus on things and finish tasks to completion.   Patient denies increased energy with decreased need for sleep, no increased talkativeness, no racing thoughts, no impulsivity or risky behaviors, no increased spending, no increased libido, no grandiosity.  Denies muscle or joint pain, stiffness, or dystonia.  Denies dizziness, syncope, seizures, numbness, tingling, tremor, tics, unsteady gait, slurred speech, confusion.   Patient states she checks her blood pressure on a daily basis and this morning it was 124/78, pulse 80ish, she cannot remember the exactly what her heart rate was.  Her blood pressure and pulse are always right around these numbers.  She has known hypertension controlled with lisinopril.  She denies any dizziness, presyncope or syncope.  Individual Medical History/ Review of Systems: Changes? :No    Past medications for mental health diagnoses include: Chantix, Lamictal, BuSpar, symbyax, Zyprexa, Xanax, Latuda, Effexor, Sonata, Ambien, Prozac, Librium, Wellbutrin, Adderall XR, Cymbalta, Hydroxyzine.  Allergies: Amlodipine and Hydrocodone  Current Medications:  Current Outpatient Medications:  .  albuterol (PROVENTIL HFA;VENTOLIN HFA) 108 (90 BASE) MCG/ACT inhaler, Inhale 2 puffs into the lungs every 6 (six) hours as needed. For ashtma, Disp: , Rfl:  .  amphetamine-dextroamphetamine (ADDERALL XR) 25 MG 24 hr capsule, Take 1 capsule by mouth daily at 12 noon., Disp: 30 capsule, Rfl: 0 .  amphetamine-dextroamphetamine (ADDERALL XR) 25 MG 24 hr capsule, Take 1 capsule by mouth  daily at 12 noon., Disp: 30 capsule, Rfl: 0 .  amphetamine-dextroamphetamine (ADDERALL XR) 25 MG 24 hr capsule, Take 1 capsule by mouth daily at 12 noon., Disp: 30 capsule, Rfl: 0 .  amphetamine-dextroamphetamine (ADDERALL XR) 30 MG 24 hr capsule, Take 1 capsule (30 mg total) by mouth daily., Disp: 30 capsule, Rfl: 0 .  amphetamine-dextroamphetamine (ADDERALL XR) 30 MG 24 hr capsule, Take 1 capsule (30 mg total) by mouth every morning., Disp: 30 capsule, Rfl: 0 .  budesonide-formoterol (SYMBICORT) 80-4.5 MCG/ACT inhaler, Inhale 2 puffs into the lungs 2 (two) times daily., Disp: , Rfl:  .  busPIRone (BUSPAR) 30 MG tablet, Take 1 tablet (30 mg total) by mouth 2 (two) times daily., Disp: 60 tablet, Rfl: 1 .  fexofenadine (ALLEGRA) 180 MG tablet, Take by mouth., Disp: , Rfl:  .  FLUoxetine (PROZAC) 40 MG capsule, Take 2 capsules (80 mg total) by mouth daily., Disp: 60 capsule, Rfl: 1 .  Fluticasone Propionate (FLONASE NA), Place into the nose., Disp: , Rfl:  .  gabapentin (NEURONTIN) 800 MG tablet, TAKE 1 TABLET(800 MG) BY MOUTH THREE TIMES DAILY, Disp: , Rfl:  .  ibuprofen (ADVIL,MOTRIN) 800 MG tablet, Take 1 tablet (800 mg total) by mouth every 8 (eight) hours as needed for moderate pain., Disp: 15 tablet, Rfl: 0 .  lamoTRIgine (LAMICTAL) 200 MG tablet, TAKE 1 TABLET BY MOUTH TWICE DAILY, Disp: 180 tablet, Rfl: 0 .  linagliptin (TRADJENTA) 5 MG TABS tablet, TAKE 1 TABLET(5 MG) BY MOUTH EVERY DAY, Disp: , Rfl:  .  lisinopril (PRINIVIL,ZESTRIL) 20 MG tablet, Take 20 mg by mouth daily., Disp: , Rfl:  .  metFORMIN (GLUCOPHAGE) 500 MG tablet, Take 500 mg by mouth 2 (two) times daily with a meal., Disp: , Rfl:  .  OLANZapine (ZYPREXA) 10 MG tablet, Take 1 tablet (10 mg total) by mouth at bedtime., Disp: 90 tablet, Rfl: 1 .  omeprazole (PRILOSEC) 20 MG capsule, Take 20 mg by mouth daily., Disp: , Rfl:  .  ondansetron (ZOFRAN) 4 MG tablet, Take by mouth., Disp: , Rfl:  .  potassium chloride (K-DUR) 10 MEQ  tablet, TAKE 1 TABLET(10 MEQ) BY MOUTH EVERY DAY, Disp: , Rfl:  .  promethazine (PHENERGAN) 25 MG tablet, TAKE 1 TABLET BY MOUTH EVERY 4 HOURS AS NEEDED FOR NAUSEA, Disp: , Rfl:  .  varenicline (CHANTIX STARTING MONTH PAK) 0.5 MG X 11 & 1 MG X 42 tablet, Take one 0.5 mg tablet by mouth once daily for 3 days, then increase to one 0.5 mg tablet twice daily for 4 days, then increase to one 1 mg tablet twice daily., Disp: 53 tablet, Rfl: 0 .  zolpidem (AMBIEN CR) 12.5 MG CR tablet, Take 1 tablet (12.5 mg total) by mouth at bedtime as needed for sleep., Disp: 30 tablet, Rfl: 0 .  guaiFENesin-codeine 100-10 MG/5ML syrup, Take 5 mLs by mouth every 4 (four) hours as needed for cough. (Patient not taking: Reported on 10/03/2018), Disp: 120 mL, Rfl: 0 .  oxyCODONE-acetaminophen (PERCOCET/ROXICET) 5-325 MG tablet, Take 1 tablet by mouth every 4 (four) hours as needed for severe pain. (Patient not taking: Reported on 10/03/2018), Disp: 15 tablet, Rfl: 0 .  predniSONE (STERAPRED UNI-PAK 48 TAB) 10 MG (48) TBPK tablet, Take 6 pills for  2 days then decrease by 1 pill every 2 days (Patient not taking: Reported on 10/03/2018), Disp: 48 tablet, Rfl: 0 .  propranolol (INDERAL) 20 MG tablet, 1 po q am for 4 days, then may increase to bid., Disp: 60 tablet, Rfl: 1 Medication Side Effects: none  Family Medical/ Social History: Changes? Yes changes due to the coronavirus pandemic and the fact that she and her family have shelter in place.  MENTAL HEALTH EXAM:  There were no vitals taken for this visit.There is no height or weight on file to calculate BMI.  General Appearance: Telephone visit, unable to assess  Eye Contact:  Unable to assess  Speech:  Clear and Coherent  Volume:  Normal  Mood:  Euthymic  Affect:  Unable to assess  Thought Process:  Goal Directed  Orientation:  Full (Time, Place, and Person)  Thought Content: Logical   Suicidal Thoughts:  No  Homicidal Thoughts:  No  Memory:  WNL  Judgement:  Good   Insight:  Good  Psychomotor Activity:  Unable to assess  Concentration:  Concentration: Good and Attention Span: Good  Recall:  Good  Fund of Knowledge: Good  Language: Good  Assets:  Desire for Improvement  ADL's:  Intact  Cognition: WNL  Prognosis:  Good    DIAGNOSES:    ICD-10-CM   1. Generalized anxiety disorder F41.1   2. Major depressive disorder, recurrent episode, moderate (HCC) F33.1   3. Attention deficit hyperactivity disorder (ADHD), predominantly inattentive type F90.0   4. Encounter for smoking cessation counseling Z71.6     Receiving Psychotherapy: No    RECOMMENDATIONS: Discussed different options for anxiety.  She is having a lot of physical symptoms including palpitations, discussed adding propranolol.  Her blood pressure and pulse is fine per her report.  She knows to let me know if she should get dizzy, hypotensive or her pulse goes below 60. Continue Adderall XR 30 mg every morning and XR 20 mg around noon. Continue BuSpar 30 mg twice daily. Continue Prozac 80 mg daily. Continue gabapentin 800 mg 3 times daily. Continue Lamictal 200 mg twice daily. Continue Zyprexa 10 mg nightly. Continue Chantix as directed. Continue Ambien CR 12.5 mg nightly. Start propranolol 20 mg 1 every morning for 4 days and if she tolerates that well can increase to twice daily.  She verbalizes understanding. Return in 6 weeks.   Melony Overlyeresa Kaytelynn Scripter, PA-C   This record has been created using [employer] software.  Chart creation errors have been sought, but may not always have been located and corrected. Such creation errors do not reflect on the standard of medical care.

## 2019-02-01 ENCOUNTER — Other Ambulatory Visit: Payer: Self-pay | Admitting: Physician Assistant

## 2019-02-03 ENCOUNTER — Other Ambulatory Visit: Payer: Self-pay | Admitting: Physician Assistant

## 2019-02-04 NOTE — Telephone Encounter (Signed)
Same dose pak?

## 2019-02-13 ENCOUNTER — Telehealth: Payer: Self-pay | Admitting: Physician Assistant

## 2019-02-13 ENCOUNTER — Other Ambulatory Visit: Payer: Self-pay

## 2019-02-13 MED ORDER — AMPHETAMINE-DEXTROAMPHET ER 25 MG PO CP24: 25.0000 mg | ORAL_CAPSULE | Freq: Every day | ORAL | 0 refills | Status: DC

## 2019-02-13 MED ORDER — AMPHETAMINE-DEXTROAMPHET ER 30 MG PO CP24: 30.0000 mg | ORAL_CAPSULE | Freq: Every day | ORAL | 0 refills | Status: DC

## 2019-02-13 NOTE — Telephone Encounter (Signed)
These are correct.  Thanks for catching it!  I dictated it wrong in the last note.

## 2019-02-13 NOTE — Telephone Encounter (Signed)
Pt called to request refill on Ambien. [facility] Also, need Adderall refill next week pt advised. Next appt 5/26

## 2019-02-13 NOTE — Telephone Encounter (Signed)
Pended for approval.

## 2019-02-16 ENCOUNTER — Other Ambulatory Visit: Payer: Self-pay | Admitting: Physician Assistant

## 2019-02-18 ENCOUNTER — Other Ambulatory Visit: Payer: Self-pay

## 2019-02-18 ENCOUNTER — Ambulatory Visit (INDEPENDENT_AMBULATORY_CARE_PROVIDER_SITE_OTHER): Payer: BC Managed Care – PPO | Admitting: Physician Assistant

## 2019-02-18 ENCOUNTER — Encounter: Payer: Self-pay | Admitting: Physician Assistant

## 2019-02-18 DIAGNOSIS — F900 Attention-deficit hyperactivity disorder, predominantly inattentive type: Secondary | ICD-10-CM | POA: Diagnosis not present

## 2019-02-18 DIAGNOSIS — F331 Major depressive disorder, recurrent, moderate: Secondary | ICD-10-CM | POA: Diagnosis not present

## 2019-02-18 DIAGNOSIS — F411 Generalized anxiety disorder: Secondary | ICD-10-CM

## 2019-02-18 MED ORDER — ALPRAZOLAM 0.5 MG PO TABS: 0.5000 mg | ORAL_TABLET | Freq: Every day | ORAL | 0 refills | Status: DC | PRN

## 2019-02-18 MED ORDER — ZOLPIDEM TARTRATE ER 12.5 MG PO TBCR: 12.5000 mg | EXTENDED_RELEASE_TABLET | Freq: Every evening | ORAL | 0 refills | Status: DC | PRN

## 2019-02-18 NOTE — Progress Notes (Signed)
[facility]  Patient ID: Katrina Booker,  MRN: [id number]  PCP: Dorothey BasemanBronstein, David, MD  Date of Evaluation: 02/18/2019 Time spent:15 minutes  Chief Complaint:  Chief Complaint    Anxiety; Follow-up     Virtual Visit via Telephone Note  I connected with patient by a video enabled telemedicine application or telephone, with their informed consent, and verified patient privacy and that I am speaking with the correct person using two identifiers.  I am private, in my home and the patient is home.   I discussed the limitations, risks, security and privacy concerns of performing an evaluation and management service by telephone and the availability of in person appointments. I also discussed with the patient that there may be a patient responsible charge related to this service. The patient expressed understanding and agreed to proceed.   I discussed the assessment and treatment plan with the patient. The patient was provided an opportunity to ask questions and all were answered. The patient agreed with the plan and demonstrated an understanding of the instructions.   The patient was advised to call back or seek an in-person evaluation if the symptoms worsen or if the condition fails to improve as anticipated.  I provided 15 minutes of non-face-to-face time during this encounter.  HISTORY/CURRENT STATUS: HPI For 1 month med check.  States nothing has helped anxiety.  LOV, we increased Buspar.  No relief at all.  Added Inderal which has helped the palpitations some.  Denies hypotension, dizziness, or feeling faint.  "I'm miserable.  The anxiety is a lot worse in the late afternoon and evening.  My thoughts race and I cannot get them out of my head.  There is so much going on.  I am running 2 households, taking care of my mother who has home dialysis every night.  Also taking care of a relative that has got end-stage Alzheimer's disease.  And my 219-month-old grandchild.  If I do not  take the Adderall, I cannot function and get everything done.  I am super sleepy, I cannot focus.  I do not think it is making me any more anxious.  I just feel terrible."  States she has been smoking more because of the anxiety.  When she gets anxious, she has racing thoughts and feels like something bad is going to happen.  She obsesses about it and then cannot get those thoughts out of her head.  She has trouble going to sleep because of the thoughts, even with the Ambien.  Other times, the Ambien works well.  Patient denies loss of interest in usual activities and is able to enjoy things.  Denies decreased energy or motivation.  Appetite has not changed.  No extreme sadness, tearfulness, or feelings of hopelessness.  Denies any changes in concentration, making decisions or remembering things.  Denies suicidal or homicidal thoughts.  Patient denies increased energy with decreased need for sleep, no increased talkativeness, no racing thoughts, no impulsivity or risky behaviors, no increased spending, no increased libido, no grandiosity.  Denies dizziness, syncope, seizures, numbness, tingling, tremor, tics, unsteady gait, slurred speech, confusion. Denies muscle or joint pain, stiffness, or dystonia.  Individual Medical History/ Review of Systems: Changes? :No    Past medications for mental health diagnoses include: Chantix, Lamictal, BuSpar, symbyax, Zyprexa, Xanax, Latuda, Effexor, Sonata, Ambien, Prozac, Librium, Wellbutrin, Adderall XR, Cymbalta, Hydroxyzine, Klonopin, Zoloft.   Allergies: Amlodipine and Hydrocodone  Current Medications:  Current Outpatient Medications:  .  albuterol (PROVENTIL HFA;VENTOLIN HFA) 108 (  90 BASE) MCG/ACT inhaler, Inhale 2 puffs into the lungs every 6 (six) hours as needed. For ashtma, Disp: , Rfl:  .  amphetamine-dextroamphetamine (ADDERALL XR) 25 MG 24 hr capsule, Take 1 capsule by mouth daily at 12 noon., Disp: 30 capsule, Rfl: 0 .   amphetamine-dextroamphetamine (ADDERALL XR) 25 MG 24 hr capsule, Take 1 capsule by mouth daily at 12 noon., Disp: 30 capsule, Rfl: 0 .  amphetamine-dextroamphetamine (ADDERALL XR) 25 MG 24 hr capsule, Take 1 capsule by mouth daily at 12 noon., Disp: 30 capsule, Rfl: 0 .  amphetamine-dextroamphetamine (ADDERALL XR) 30 MG 24 hr capsule, Take 1 capsule (30 mg total) by mouth every morning., Disp: 30 capsule, Rfl: 0 .  amphetamine-dextroamphetamine (ADDERALL XR) 30 MG 24 hr capsule, Take 1 capsule (30 mg total) by mouth daily., Disp: 30 capsule, Rfl: 0 .  budesonide-formoterol (SYMBICORT) 80-4.5 MCG/ACT inhaler, Inhale 2 puffs into the lungs 2 (two) times daily., Disp: , Rfl:  .  busPIRone (BUSPAR) 30 MG tablet, TAKE 1 TABLET(30 MG) BY MOUTH TWICE DAILY, Disp: 60 tablet, Rfl: 1 .  CHANTIX CONTINUING MONTH PAK 1 MG tablet, TAKE 1 TABLET BY MOUTH TWICE DAILY( EVERY 12 HOURS), Disp: 56 tablet, Rfl: 1 .  fexofenadine (ALLEGRA) 180 MG tablet, Take by mouth., Disp: , Rfl:  .  FLUoxetine (PROZAC) 40 MG capsule, TAKE 2 CAPSULES(80 MG) BY MOUTH DAILY, Disp: 60 capsule, Rfl: 1 .  Fluticasone Propionate (FLONASE NA), Place into the nose., Disp: , Rfl:  .  gabapentin (NEURONTIN) 800 MG tablet, TAKE 1 TABLET(800 MG) BY MOUTH THREE TIMES DAILY, Disp: , Rfl:  .  ibuprofen (ADVIL,MOTRIN) 800 MG tablet, Take 1 tablet (800 mg total) by mouth every 8 (eight) hours as needed for moderate pain., Disp: 15 tablet, Rfl: 0 .  lamoTRIgine (LAMICTAL) 200 MG tablet, TAKE 1 TABLET BY MOUTH TWICE DAILY, Disp: 180 tablet, Rfl: 0 .  linagliptin (TRADJENTA) 5 MG TABS tablet, TAKE 1 TABLET(5 MG) BY MOUTH EVERY DAY, Disp: , Rfl:  .  lisinopril (PRINIVIL,ZESTRIL) 20 MG tablet, Take 20 mg by mouth daily., Disp: , Rfl:  .  metFORMIN (GLUCOPHAGE) 500 MG tablet, Take 500 mg by mouth 2 (two) times daily with a meal., Disp: , Rfl:  .  OLANZapine (ZYPREXA) 10 MG tablet, Take 1 tablet (10 mg total) by mouth at bedtime., Disp: 90 tablet, Rfl: 1 .   omeprazole (PRILOSEC) 20 MG capsule, Take 20 mg by mouth daily., Disp: , Rfl:  .  ondansetron (ZOFRAN) 4 MG tablet, Take by mouth., Disp: , Rfl:  .  potassium chloride (K-DUR) 10 MEQ tablet, TAKE 1 TABLET(10 MEQ) BY MOUTH EVERY DAY, Disp: , Rfl:  .  promethazine (PHENERGAN) 25 MG tablet, TAKE 1 TABLET BY MOUTH EVERY 4 HOURS AS NEEDED FOR NAUSEA, Disp: , Rfl:  .  propranolol (INDERAL) 20 MG tablet, 1 po q am for 4 days, then may increase to bid., Disp: 60 tablet, Rfl: 1 .  zolpidem (AMBIEN CR) 12.5 MG CR tablet, Take 1 tablet (12.5 mg total) by mouth at bedtime as needed for sleep., Disp: 30 tablet, Rfl: 0 .  ALPRAZolam (XANAX) 0.5 MG tablet, Take 1 tablet (0.5 mg total) by mouth daily as needed for anxiety., Disp: 20 tablet, Rfl: 0 .  guaiFENesin-codeine 100-10 MG/5ML syrup, Take 5 mLs by mouth every 4 (four) hours as needed for cough. (Patient not taking: Reported on 10/03/2018), Disp: 120 mL, Rfl: 0 .  oxyCODONE-acetaminophen (PERCOCET/ROXICET) 5-325 MG tablet, Take 1 tablet by mouth  every 4 (four) hours as needed for severe pain. (Patient not taking: Reported on 10/03/2018), Disp: 15 tablet, Rfl: 0 .  predniSONE (STERAPRED UNI-PAK 48 TAB) 10 MG (48) TBPK tablet, Take 6 pills for 2 days then decrease by 1 pill every 2 days (Patient not taking: Reported on 10/03/2018), Disp: 48 tablet, Rfl: 0 .  varenicline (CHANTIX STARTING MONTH PAK) 0.5 MG X 11 & 1 MG X 42 tablet, Take one 0.5 mg tablet by mouth once daily for 3 days, then increase to one 0.5 mg tablet twice daily for 4 days, then increase to one 1 mg tablet twice daily. (Patient not taking: Reported on 02/18/2019), Disp: 53 tablet, Rfl: 0 .  zolpidem (AMBIEN CR) 12.5 MG CR tablet, Take 1 tablet (12.5 mg total) by mouth at bedtime as needed for sleep., Disp: 30 tablet, Rfl: 0 Medication Side Effects: none  Family Medical/ Social History: Changes? Yes see above  MENTAL HEALTH EXAM:  There were no vitals taken for this visit.There is no height or weight  on file to calculate BMI.  General Appearance: unable to assess  Eye Contact:  unable to assess  Speech:  Clear and Coherent  Volume:  Normal  Mood:  Anxious  Affect:  unable to assess  Thought Process:  Goal Directed  Orientation:  Full (Time, Place, and Person)  Thought Content: Logical   Suicidal Thoughts:  No  Homicidal Thoughts:  No  Memory:  WNL  Judgement:  Good  Insight:  Good  Psychomotor Activity:  unable to assess  Concentration:  Concentration: Good and Attention Span: Good  Recall:  Good  Fund of Knowledge: Good  Language: Good  Assets:  Desire for Improvement  ADL's:  Intact  Cognition: WNL  Prognosis:  Good    DIAGNOSES:    ICD-10-CM   1. Generalized anxiety disorder F41.1   2. Major depressive disorder, recurrent episode, moderate (HCC) F33.1   3. Attention deficit hyperactivity disorder (ADHD), predominantly inattentive type F90.0     Receiving Psychotherapy: No    RECOMMENDATIONS: We had a long discussion about using stimulants and benzos.  We have tried multiple medications, most of which she is still on for depression or other reasons, for the anxiety.  Some have helped in small ways but nothing has gotten rid of the anxiety.  I have agreed to give her a few Xanax in hopes that we can break the anxiety cycle.  She can only take 1 a day for a short time and she understands that I will not continue to prescribe this.  She should take it in the late afternoon or early evening to help calm the racing thoughts.  She verbalizes understanding. Start Xanax 0.5 mg 1 daily as needed.  PDMP was reviewed. Continue Adderall XR 30 mg every morning. Continue Adderall X are 25 mg q. early afternoon. Continue BuSpar 30 mg twice daily. Continue Prozac 80 mg daily. Continue gabapentin 800 mg 3 times daily as needed. Continue Lamictal 200 mg twice daily. Continue Zyprexa 10 mg nightly. Continue propranolol 20 mg twice daily. Continue Chantix daily. Continue Ambien CR  12.5 mg nightly as needed. Discussed coping techniques. Return in 4 to 6 weeks.  Melony Overly, PA-C   This record has been created using [employer].  Chart creation errors have been sought, but may not always have been located and corrected. Such creation errors do not reflect on the standard of medical care.

## 2019-03-12 ENCOUNTER — Telehealth: Payer: Self-pay | Admitting: Physician Assistant

## 2019-03-12 ENCOUNTER — Other Ambulatory Visit: Payer: Self-pay

## 2019-03-12 MED ORDER — AMPHETAMINE-DEXTROAMPHET ER 25 MG PO CP24: 25.0000 mg | ORAL_CAPSULE | Freq: Every day | ORAL | 0 refills | Status: DC

## 2019-03-12 MED ORDER — AMPHETAMINE-DEXTROAMPHET ER 30 MG PO CP24: 30.0000 mg | ORAL_CAPSULE | Freq: Every day | ORAL | 0 refills | Status: DC

## 2019-03-12 NOTE — Telephone Encounter (Signed)
Patient called and made a follow up appt in July. She needs refill on her adderall 25 mg 1 tab at noon and her adderall 30 mg 1 cap daily sent into the [facility] in [place] on [address]

## 2019-03-12 NOTE — Telephone Encounter (Signed)
Pended for approval.

## 2019-03-12 NOTE — Telephone Encounter (Signed)
Last appointment 02/18/2019 with last fill of Adderall 02/13/2019 per [facility] registry now seeking next fill for both 30 mg XR every morning and 25 mg XR every noon to send as 03/14/2019 #30 each with no refill to [facility]

## 2019-03-14 ENCOUNTER — Other Ambulatory Visit: Payer: Self-pay | Admitting: Physician Assistant

## 2019-03-17 NOTE — Telephone Encounter (Signed)
Next appt 07/09

## 2019-03-19 ENCOUNTER — Emergency Department: Payer: Medicare Other

## 2019-03-19 ENCOUNTER — Other Ambulatory Visit: Payer: Self-pay

## 2019-03-19 ENCOUNTER — Observation Stay
Admission: EM | Admit: 2019-03-19 | Discharge: 2019-03-20 | Disposition: A | Discharge status: Home / Self Care | Payer: Medicare Other | Attending: Internal Medicine | Admitting: Internal Medicine

## 2019-03-19 ENCOUNTER — Encounter: Payer: Self-pay | Admitting: Emergency Medicine

## 2019-03-19 DIAGNOSIS — Z79899 Other long term (current) drug therapy: Secondary | ICD-10-CM | POA: Insufficient documentation

## 2019-03-19 DIAGNOSIS — E119 Type 2 diabetes mellitus without complications: Secondary | ICD-10-CM | POA: Insufficient documentation

## 2019-03-19 DIAGNOSIS — W109XXA Fall (on) (from) unspecified stairs and steps, initial encounter: Secondary | ICD-10-CM | POA: Diagnosis not present

## 2019-03-19 DIAGNOSIS — K219 Gastro-esophageal reflux disease without esophagitis: Secondary | ICD-10-CM | POA: Insufficient documentation

## 2019-03-19 DIAGNOSIS — J45998 Other asthma: Secondary | ICD-10-CM | POA: Diagnosis not present

## 2019-03-19 DIAGNOSIS — R4020 Unspecified coma: Secondary | ICD-10-CM

## 2019-03-19 DIAGNOSIS — F17210 Nicotine dependence, cigarettes, uncomplicated: Secondary | ICD-10-CM | POA: Diagnosis not present

## 2019-03-19 DIAGNOSIS — R51 Headache: Secondary | ICD-10-CM | POA: Diagnosis not present

## 2019-03-19 DIAGNOSIS — F909 Attention-deficit hyperactivity disorder, unspecified type: Secondary | ICD-10-CM | POA: Diagnosis not present

## 2019-03-19 DIAGNOSIS — E860 Dehydration: Secondary | ICD-10-CM | POA: Diagnosis not present

## 2019-03-19 DIAGNOSIS — Z7951 Long term (current) use of inhaled steroids: Secondary | ICD-10-CM | POA: Insufficient documentation

## 2019-03-19 DIAGNOSIS — F319 Bipolar disorder, unspecified: Secondary | ICD-10-CM | POA: Insufficient documentation

## 2019-03-19 DIAGNOSIS — Z1159 Encounter for screening for other viral diseases: Secondary | ICD-10-CM | POA: Insufficient documentation

## 2019-03-19 DIAGNOSIS — Z7984 Long term (current) use of oral hypoglycemic drugs: Secondary | ICD-10-CM | POA: Insufficient documentation

## 2019-03-19 DIAGNOSIS — N179 Acute kidney failure, unspecified: Principal | ICD-10-CM

## 2019-03-19 DIAGNOSIS — I951 Orthostatic hypotension: Secondary | ICD-10-CM | POA: Diagnosis not present

## 2019-03-19 DIAGNOSIS — R55 Syncope and collapse: Secondary | ICD-10-CM

## 2019-03-19 DIAGNOSIS — R011 Cardiac murmur, unspecified: Secondary | ICD-10-CM | POA: Insufficient documentation

## 2019-03-19 DIAGNOSIS — I959 Hypotension, unspecified: Secondary | ICD-10-CM | POA: Diagnosis present

## 2019-03-19 DIAGNOSIS — T07XXXA Unspecified multiple injuries, initial encounter: Secondary | ICD-10-CM

## 2019-03-19 DIAGNOSIS — I950 Idiopathic hypotension: Secondary | ICD-10-CM

## 2019-03-19 DIAGNOSIS — T148XXA Other injury of unspecified body region, initial encounter: Secondary | ICD-10-CM | POA: Diagnosis not present

## 2019-03-19 LAB — URINALYSIS, COMPLETE (UACMP) WITH MICROSCOPIC
Glucose, UA: NEGATIVE mg/dL
Hgb urine dipstick: NEGATIVE
Ketones, ur: NEGATIVE mg/dL
Leukocytes,Ua: NEGATIVE
Nitrite: NEGATIVE
Protein, ur: 100 mg/dL — AB
Specific Gravity, Urine: 1.027 (ref 1.005–1.030)
pH: 5 (ref 5.0–8.0)

## 2019-03-19 LAB — COMPREHENSIVE METABOLIC PANEL
ALT: 33 U/L (ref 0–44)
AST: 21 U/L (ref 15–41)
Albumin: 4 g/dL (ref 3.5–5.0)
Alkaline Phosphatase: 84 U/L (ref 38–126)
Anion gap: 13 (ref 5–15)
BUN: 35 mg/dL — ABNORMAL HIGH (ref 6–20)
CO2: 20 mmol/L — ABNORMAL LOW (ref 22–32)
Calcium: 9.6 mg/dL (ref 8.9–10.3)
Chloride: 106 mmol/L (ref 98–111)
Creatinine, Ser: 2.97 mg/dL — ABNORMAL HIGH (ref 0.44–1.00)
GFR calc Af Amer: 22 mL/min — ABNORMAL LOW (ref >60)
GFR calc non Af Amer: 19 mL/min — ABNORMAL LOW (ref >60)
Glucose, Bld: 128 mg/dL — ABNORMAL HIGH (ref 70–99)
Potassium: 4.4 mmol/L (ref 3.5–5.1)
Sodium: 139 mmol/L (ref 135–145)
Total Bilirubin: 0.8 mg/dL (ref 0.3–1.2)
Total Protein: 7.3 g/dL (ref 6.5–8.1)

## 2019-03-19 LAB — CK: Total CK: 127 U/L (ref 38–234)

## 2019-03-19 LAB — CBC WITH DIFFERENTIAL/PLATELET
Abs Immature Granulocytes: 0.04 10*3/uL (ref 0.00–0.07)
Basophils Absolute: 0.1 10*3/uL (ref 0.0–0.1)
Basophils Relative: 0 %
Eosinophils Absolute: 0.1 10*3/uL (ref 0.0–0.5)
Eosinophils Relative: 1 %
HCT: 36.8 % (ref 36.0–46.0)
Hemoglobin: 12 g/dL (ref 12.0–15.0)
Immature Granulocytes: 0 %
Lymphocytes Relative: 28 %
Lymphs Abs: 3.4 10*3/uL (ref 0.7–4.0)
MCH: 27.1 pg (ref 26.0–34.0)
MCHC: 32.6 g/dL (ref 30.0–36.0)
MCV: 83.3 fL (ref 80.0–100.0)
Monocytes Absolute: 1.1 10*3/uL — ABNORMAL HIGH (ref 0.1–1.0)
Monocytes Relative: 10 %
Neutro Abs: 7.3 10*3/uL (ref 1.7–7.7)
Neutrophils Relative %: 61 %
Platelets: 350 10*3/uL (ref 150–400)
RBC: 4.42 MIL/uL (ref 3.87–5.11)
RDW: 15 % (ref 11.5–15.5)
WBC: 11.9 10*3/uL — ABNORMAL HIGH (ref 4.0–10.5)
nRBC: 0 % (ref 0.0–0.2)

## 2019-03-19 LAB — TROPONIN I (HIGH SENSITIVITY)
Troponin I (High Sensitivity): 19 ng/L — ABNORMAL HIGH (ref <18)
Troponin I (High Sensitivity): 16 ng/L (ref <18)
Troponin I (High Sensitivity): 16 ng/L (ref <18)

## 2019-03-19 LAB — URINE DRUG SCREEN, QUALITATIVE (ARMC ONLY)
Amphetamines, Ur Screen: POSITIVE — AB
Barbiturates, Ur Screen: NOT DETECTED
Benzodiazepine, Ur Scrn: POSITIVE — AB
Cannabinoid 50 Ng, Ur ~~LOC~~: NOT DETECTED
Cocaine Metabolite,Ur ~~LOC~~: NOT DETECTED
MDMA (Ecstasy)Ur Screen: NOT DETECTED
Methadone Scn, Ur: NOT DETECTED
Opiate, Ur Screen: NOT DETECTED
Phencyclidine (PCP) Ur S: NOT DETECTED
Tricyclic, Ur Screen: POSITIVE — AB

## 2019-03-19 LAB — LACTIC ACID, PLASMA
Lactic Acid, Venous: 1.6 mmol/L (ref 0.5–1.9)
Lactic Acid, Venous: 1.2 mmol/L (ref 0.5–1.9)

## 2019-03-19 LAB — GLUCOSE, CAPILLARY: Glucose-Capillary: 109 mg/dL — ABNORMAL HIGH (ref 70–99)

## 2019-03-19 MED ORDER — VARENICLINE TARTRATE 1 MG PO TABS
1.0000 mg | ORAL_TABLET | Freq: Two times a day (BID) | ORAL | Status: DC
  Filled 2019-03-19: qty 1

## 2019-03-19 MED ORDER — TRAMADOL HCL 50 MG PO TABS
50.0000 mg | ORAL_TABLET | Freq: Four times a day (QID) | ORAL | Status: DC | PRN
  Administered 2019-03-20: 50 mg via ORAL
  Filled 2019-03-19 (×2): qty 1

## 2019-03-19 MED ORDER — FLUOXETINE HCL 20 MG PO CAPS
40.0000 mg | ORAL_CAPSULE | Freq: Every day | ORAL | Status: DC
  Administered 2019-03-20: 40 mg via ORAL
  Filled 2019-03-19: qty 2

## 2019-03-19 MED ORDER — PANTOPRAZOLE SODIUM 40 MG PO TBEC
40.0000 mg | DELAYED_RELEASE_TABLET | Freq: Every day | ORAL | Status: DC
  Administered 2019-03-20: 40 mg via ORAL
  Filled 2019-03-19: qty 1

## 2019-03-19 MED ORDER — SODIUM CHLORIDE 0.9 % IV BOLUS
1000.0000 mL | Freq: Once | INTRAVENOUS | Status: AC
  Administered 2019-03-19: 1000 mL via INTRAVENOUS

## 2019-03-19 MED ORDER — ALBUTEROL SULFATE (2.5 MG/3ML) 0.083% IN NEBU: 2.5000 mg | INHALATION_SOLUTION | Freq: Four times a day (QID) | RESPIRATORY_TRACT | Status: DC | PRN

## 2019-03-19 MED ORDER — ALPRAZOLAM 0.25 MG PO TABS
0.2500 mg | ORAL_TABLET | Freq: Two times a day (BID) | ORAL | Status: DC | PRN
  Filled 2019-03-19: qty 1

## 2019-03-19 MED ORDER — MOMETASONE FURO-FORMOTEROL FUM 100-5 MCG/ACT IN AERO
2.0000 | INHALATION_SPRAY | Freq: Two times a day (BID) | RESPIRATORY_TRACT | Status: DC
  Filled 2019-03-19: qty 8.8

## 2019-03-19 MED ORDER — INSULIN ASPART 100 UNIT/ML ~~LOC~~ SOLN: 0.0000 [IU] | Freq: Every day | SUBCUTANEOUS | Status: DC

## 2019-03-19 MED ORDER — AMPHETAMINE-DEXTROAMPHET ER 5 MG PO CP24
25.0000 mg | ORAL_CAPSULE | Freq: Every day | ORAL | Status: DC
  Administered 2019-03-20: 13:00:00 25 mg via ORAL
  Filled 2019-03-19: qty 5

## 2019-03-19 MED ORDER — LAMOTRIGINE 100 MG PO TABS
200.0000 mg | ORAL_TABLET | Freq: Two times a day (BID) | ORAL | Status: DC
  Administered 2019-03-20: 200 mg via ORAL
  Filled 2019-03-19 (×2): qty 2

## 2019-03-19 MED ORDER — HEPARIN SODIUM (PORCINE) 5000 UNIT/ML IJ SOLN
5000.0000 [IU] | Freq: Three times a day (TID) | INTRAMUSCULAR | Status: DC
  Administered 2019-03-20: 5000 [IU] via SUBCUTANEOUS
  Filled 2019-03-19 (×2): qty 1

## 2019-03-19 MED ORDER — ZOLPIDEM TARTRATE 5 MG PO TABS: 5.0000 mg | ORAL_TABLET | Freq: Every evening | ORAL | Status: DC | PRN

## 2019-03-19 MED ORDER — ACETAMINOPHEN 325 MG PO TABS: 650.0000 mg | ORAL_TABLET | Freq: Four times a day (QID) | ORAL | Status: DC | PRN

## 2019-03-19 MED ORDER — AMPHETAMINE-DEXTROAMPHET ER 30 MG PO CP24: 30.0000 mg | ORAL_CAPSULE | Freq: Every day | ORAL | Status: DC

## 2019-03-19 MED ORDER — SENNOSIDES-DOCUSATE SODIUM 8.6-50 MG PO TABS: 1.0000 | ORAL_TABLET | Freq: Every evening | ORAL | Status: DC | PRN

## 2019-03-19 MED ORDER — OLANZAPINE 10 MG PO TABS
10.0000 mg | ORAL_TABLET | Freq: Every day | ORAL | Status: DC
  Filled 2019-03-19 (×2): qty 1

## 2019-03-19 MED ORDER — LINAGLIPTIN 5 MG PO TABS
5.0000 mg | ORAL_TABLET | Freq: Every day | ORAL | Status: DC
  Administered 2019-03-20: 5 mg via ORAL
  Filled 2019-03-19: qty 1

## 2019-03-19 MED ORDER — ONDANSETRON HCL 4 MG/2ML IJ SOLN: 4.0000 mg | Freq: Four times a day (QID) | INTRAMUSCULAR | Status: DC | PRN

## 2019-03-19 MED ORDER — NICOTINE 21 MG/24HR TD PT24
21.0000 mg | MEDICATED_PATCH | Freq: Every day | TRANSDERMAL | Status: DC
  Administered 2019-03-20: 21 mg via TRANSDERMAL
  Filled 2019-03-19: qty 1

## 2019-03-19 MED ORDER — INSULIN ASPART 100 UNIT/ML ~~LOC~~ SOLN
0.0000 [IU] | Freq: Three times a day (TID) | SUBCUTANEOUS | Status: DC
  Administered 2019-03-20: 2 [IU] via SUBCUTANEOUS
  Filled 2019-03-19: qty 1

## 2019-03-19 MED ORDER — ACETAMINOPHEN 500 MG PO TABS
1000.0000 mg | ORAL_TABLET | Freq: Once | ORAL | Status: AC
  Administered 2019-03-19: 1000 mg via ORAL
  Filled 2019-03-19: qty 2

## 2019-03-19 MED ORDER — ACETAMINOPHEN 650 MG RE SUPP: 650.0000 mg | Freq: Four times a day (QID) | RECTAL | Status: DC | PRN

## 2019-03-19 MED ORDER — SODIUM CHLORIDE 0.9 % IV SOLN
INTRAVENOUS | Status: DC
  Administered 2019-03-19 – 2019-03-20 (×2): via INTRAVENOUS

## 2019-03-19 MED ORDER — ONDANSETRON HCL 4 MG PO TABS: 4.0000 mg | ORAL_TABLET | Freq: Four times a day (QID) | ORAL | Status: DC | PRN

## 2019-03-19 NOTE — ED Notes (Signed)
Patient transported to CT 

## 2019-03-19 NOTE — ED Provider Notes (Signed)
[facility] Emergency Department Provider Note  ____________________________________________   None    (approximate)  I have reviewed the triage vital signs and the nursing notes.   HISTORY  Chief Complaint Fall    HPI Katrina Booker is a 43 y.o. female presents emergency department after she fell down 11 stairs last night.  She states she remembers getting to the top of the steps and then falling but does not remember the fall.  She states that she laid on the floor from proximately 2:30 AM to about 830 when her parents got up.  She is having tingling in both hands.  None in the legs.  Some headache.  No vomiting or diarrhea.  She denies taking any drugs.  No chest pain or shortness of breath.  No COVID exposures.    Past Medical History:  Diagnosis Date  . ADHD (attention deficit hyperactivity disorder)   . Asthma   . Bipolar 1 disorder (HCC)   . Diabetes mellitus   . GERD (gastroesophageal reflux disease)     Patient Active Problem List   Diagnosis Date Noted  . Hypotension 03/19/2019  . Attention deficit hyperactivity disorder (ADHD) 07/24/2018  . Insomnia 07/24/2018    Past Surgical History:  Procedure Laterality Date  . ABDOMINAL HYSTERECTOMY    . ABDOMINAL SURGERY    . BACK SURGERY    . HERNIA REPAIR    . INCONTINENCE SURGERY    . OTHER SURGICAL HISTORY     tumor excision on face  . TUBAL LIGATION      Prior to Admission medications   Medication Sig Start Date End Date Taking? Authorizing Provider  albuterol (PROVENTIL HFA;VENTOLIN HFA) 108 (90 BASE) MCG/ACT inhaler Inhale 2 puffs into the lungs every 6 (six) hours as needed. For ashtma    [provider]  ALPRAZolam Prudy Feeler(XANAX) 0.5 MG tablet TAKE 1 TABLET(0.5 MG) BY MOUTH DAILY AS NEEDED FOR ANXIETY 03/17/19   Hurst, Glade Nurseeresa T, PA-C  amphetamine-dextroamphetamine (ADDERALL XR) 25 MG 24 hr capsule Take 1 capsule by mouth daily at 12 noon. 01/20/19   [name], [name], PA-C   amphetamine-dextroamphetamine (ADDERALL XR) 25 MG 24 hr capsule Take 1 capsule by mouth daily at 12 noon. 02/13/19   [name], [name], PA-C  amphetamine-dextroamphetamine (ADDERALL XR) 25 MG 24 hr capsule Take 1 capsule by mouth daily after lunch. 03/14/19   Chauncey MannJennings, Glenn E, MD  amphetamine-dextroamphetamine (ADDERALL XR) 30 MG 24 hr capsule Take 1 capsule (30 mg total) by mouth daily. 02/13/19   [name], [name], PA-C  amphetamine-dextroamphetamine (ADDERALL XR) 30 MG 24 hr capsule Take 1 capsule (30 mg total) by mouth daily after breakfast. 03/14/19   Chauncey MannJennings, Glenn E, MD  budesonide-formoterol [facility](SYMBICORT) 80-4.5 MCG/ACT inhaler Inhale 2 puffs into the lungs 2 (two) times daily.    [provider]  busPIRone (BUSPAR) 30 MG tablet TAKE 1 TABLET(30 MG) BY MOUTH TWICE DAILY 02/03/19   Hurst, [name], PA-C  CHANTIX CONTINUING MONTH PAK 1 MG tablet TAKE 1 TABLET BY MOUTH TWICE DAILY( EVERY 12 HOURS) 02/04/19   Hurst, [name], PA-C  fexofenadine (ALLEGRA) 180 MG tablet Take by mouth. 06/17/18   [provider]  FLUoxetine (PROZAC) 40 MG capsule TAKE 2 CAPSULES(80 MG) BY MOUTH DAILY 02/03/19   Claybon JabsHurst, [name], PA-C  Fluticasone Propionate (FLONASE NA) Place into the nose.    [provider]  gabapentin (NEURONTIN) 800 MG tablet TAKE 1 TABLET(800 MG) BY MOUTH THREE TIMES DAILY  06/07/18   [provider]  guaiFENesin-codeine 100-10 MG/5ML syrup Take 5 mLs by mouth every 4 (four) hours as needed for cough. Patient not taking: Reported on 10/03/2018 10/27/17   Versie Starks, PA-C  ibuprofen (ADVIL,MOTRIN) 800 MG tablet Take 1 tablet (800 mg total) by mouth every 8 (eight) hours as needed for moderate pain. 09/22/18   Paulette Blanch, MD  lamoTRIgine (LAMICTAL) 200 MG tablet TAKE 1 TABLET BY MOUTH TWICE DAILY 02/17/19   Donnal Moat T, PA-C  linagliptin (TRADJENTA) 5 MG TABS tablet TAKE 1 TABLET(5 MG) BY MOUTH EVERY DAY 07/23/18   [provider]  lisinopril  (PRINIVIL,ZESTRIL) 20 MG tablet Take 20 mg by mouth daily.    [provider]  metFORMIN (GLUCOPHAGE) 500 MG tablet Take 500 mg by mouth 2 (two) times daily with a meal.    [provider]  OLANZapine (ZYPREXA) 10 MG tablet Take 1 tablet (10 mg total) by mouth at bedtime. 07/22/18   Addison Lank, PA-C  omeprazole (PRILOSEC) 20 MG capsule Take 20 mg by mouth daily.    [provider]  ondansetron (ZOFRAN) 4 MG tablet Take by mouth. 06/17/18   [provider]  oxyCODONE-acetaminophen (PERCOCET/ROXICET) 5-325 MG tablet Take 1 tablet by mouth every 4 (four) hours as needed for severe pain. Patient not taking: Reported on 10/03/2018 09/22/18   Paulette Blanch, MD  potassium chloride (K-DUR) 10 MEQ tablet TAKE 1 TABLET(10 MEQ) BY MOUTH EVERY DAY 06/07/18   [provider]  predniSONE (STERAPRED UNI-PAK 48 TAB) 10 MG (48) TBPK tablet Take 6 pills for 2 days then decrease by 1 pill every 2 days Patient not taking: Reported on 10/03/2018 10/27/17   Versie Starks, PA-C  promethazine (PHENERGAN) 25 MG tablet TAKE 1 TABLET BY MOUTH EVERY 4 HOURS AS NEEDED FOR NAUSEA 06/07/18   [provider]  propranolol (INDERAL) 20 MG tablet 1 po q am for 4 days, then may increase to bid. 01/20/19   Donnal Moat T, PA-C  varenicline (CHANTIX STARTING MONTH PAK) 0.5 MG X 11 & 1 MG X 42 tablet Take one 0.5 mg tablet by mouth once daily for 3 days, then increase to one 0.5 mg tablet twice daily for 4 days, then increase to one 1 mg tablet twice daily. Patient not taking: Reported on 02/18/2019 12/09/18   Donnal Moat T, PA-C  zolpidem (AMBIEN CR) 12.5 MG CR tablet Take 1 tablet (12.5 mg total) by mouth at bedtime as needed for sleep. 01/17/19   Donnal Moat T, PA-C  zolpidem (AMBIEN CR) 12.5 MG CR tablet TAKE 1 TABLET(12.5 MG) BY MOUTH AT BEDTIME AS NEEDED FOR SLEEP 03/17/19   Donnal Moat T, PA-C    Allergies Amlodipine and Hydrocodone  Family History  Problem Relation Age of  Onset  . Hypothyroidism Mother   . Diabetes Father   . Heart disease Father     Social History Social History   Tobacco Use  . Smoking status: Current Every Day Smoker    Packs/day: 1.00    Types: Cigarettes  . Smokeless tobacco: Never Used  Substance Use Topics  . Alcohol use: No    Alcohol/week: 0.0 standard drinks  . Drug use: No    Review of Systems   Constitutional: No fever/chills, fall and head injury, positive LOC Eyes: No visual changes. ENT: No sore throat. Respiratory: Denies cough Genitourinary: Negative for dysuria. Musculoskeletal: Negative for back pain. Skin: Negative for rash.    ____________________________________________  PHYSICAL EXAM:  VITAL SIGNS: ED Triage Vitals  Enc Vitals Group     BP 03/19/19 1434 (!) 88/42     Pulse Rate 03/19/19 1434 (!) 108     Resp 03/19/19 1434 (!) 22     Temp 03/19/19 1434 98.6 F (37 C)     Temp Source 03/19/19 1434 Oral     SpO2 03/19/19 1434 98 %     Weight 03/19/19 1434 200 lb (90.7 kg)     Height 03/19/19 1434 5\' 5"  (1.651 m)     Head Circumference --      Peak Flow --      Pain Score 03/19/19 1433 6     Pain Loc --      Pain Edu? --      Excl. in GC? --     Constitutional: Alert and oriented. Well appearing and in no acute distress. Eyes: Conjunctivae are normal.  Head: Atraumatic. Nose: No congestion/rhinnorhea. Mouth/Throat: Mucous membranes are moist.   Neck:  supple no lymphadenopathy noted Cardiovascular: Normal rate, regular rhythm. Heart sounds are normal Respiratory: Normal respiratory effort.  No retractions, lungs c t a  Abd: soft nontender bs normal all 4 quad GU: deferred Musculoskeletal: FROM all extremities, warm and well perfused, bruise noted on the right shoulder, C-spine is mildly tender Neurologic:  Normal speech and language.  Cranial nerves II through XII grossly intact Skin:  Skin is warm, dry and intact. No rash noted. Psychiatric: Mood and affect are normal. Speech  and behavior are normal.  ____________________________________________   LABS (all labs ordered are listed, but only abnormal results are displayed)  Labs Reviewed  COMPREHENSIVE METABOLIC PANEL - Abnormal; Notable for the following components:      Result Value   CO2 20 (*)    Glucose, Bld 128 (*)    BUN 35 (*)    Creatinine, Ser 2.97 (*)    GFR calc non Af Amer 19 (*)    GFR calc Af Amer 22 (*)    All other components within normal limits  TROPONIN I (HIGH SENSITIVITY) - Abnormal; Notable for the following components:   Troponin I (High Sensitivity) 19 (*)    All other components within normal limits  CBC WITH DIFFERENTIAL/PLATELET - Abnormal; Notable for the following components:   WBC 11.9 (*)    Monocytes Absolute 1.1 (*)    All other components within normal limits  URINE DRUG SCREEN, QUALITATIVE ([facility] ONLY) - Abnormal; Notable for the following components:   Tricyclic, Ur Screen POSITIVE (*)    Amphetamines, Ur Screen POSITIVE (*)    Benzodiazepine, Ur Scrn POSITIVE (*)    All other components within normal limits  URINALYSIS, COMPLETE (UACMP) WITH MICROSCOPIC - Abnormal; Notable for the following components:   Color, Urine AMBER (*)    APPearance CLOUDY (*)    Bilirubin Urine MODERATE (*)    Protein, ur 100 (*)    Bacteria, UA RARE (*)    Non Squamous Epithelial PRESENT (*)    All other components within normal limits  NOVEL CORONAVIRUS, NAA (HOSPITAL ORDER, SEND-OUT TO REF LAB)  TROPONIN I (HIGH SENSITIVITY)  LACTIC ACID, PLASMA  LACTIC ACID, PLASMA  CK  HIV ANTIBODY (ROUTINE TESTING W REFLEX)  CBC  CREATININE, SERUM  BASIC METABOLIC PANEL  CBC   ____________________________________________   ____________________________________________  RADIOLOGY  CT of the head C-spine and lumbar spine and thoracic spine are all negative for any acute abnormality  ____________________________________________   PROCEDURES  Procedure(s) performed: Saline lock, 2  L normal saline IV   Procedures    ____________________________________________   INITIAL IMPRESSION / ASSESSMENT AND PLAN / ED COURSE  Pertinent labs & imaging results that were available during my care of the patient were reviewed by me and considered in my medical decision making (see chart for details).   Patient is 43 year old female presents emergency department after a fall last night.  She fell down approximately 11 steps lost consciousness and laid on the floor for 6 hours.  She is complaining of numbness and tingling into the right and left hands.  Physical exam shows patient is alert and oriented.  Blood pressure is low at 87/45.  C-spine is mildly tender.  Bruises are noted on the shoulders and the right side.  Abdomen is soft and nontender.  DDX: LOC, fall, acute C-spine injury, illicit drug use, hypotension   CBC has elevated WBC 11.9, comprehensive metabolic panel shows a AKI with elevated BUN of 35 and creatinine at 2.97, troponin is elevated by one-point at 19, lactic acid is normal, CK is normal, urinalysis shows 100 protein with moderate bili's, UDS shows tricyclics, amphetamines, and benzos  CT of the head, C-spine, T-spine, and L-spine are negative for any acute fractures or abnormalities.  Blood pressure continues to stay low.  She is on her second bag of fluids.  Due to the AKI she will be admitted to the hospital.  Hospitalist was notified.  He is accepting care will put the patient out on observation for fluid replenishment.  He was advised of her fall and negative CTs.  Katrina Booker was evaluated in Emergency Department on 03/19/2019 for the symptoms described in the history of present illness. She was evaluated in the context of the global COVID-19 pandemic, which necessitated consideration that the patient might be at risk for infection with the SARS-CoV-2 virus that causes COVID-19. Institutional protocols and algorithms that pertain to the evaluation of  patients at risk for COVID-19 are in a state of rapid change based on information released by regulatory bodies including the CDC and federal and state organizations. These policies and algorithms were followed during the patient's care in the ED.     As part of my medical decision making, I reviewed the following data within the electronic MEDICAL RECORD NUMBER Nursing notes reviewed and incorporated, Labs reviewed above, EKG interpreted NSR, Old chart reviewed, Radiograph reviewed CTs do not show any acute abnormalities, Discussed with admitting physician Perretti, Evaluated by EM attending Dr. Derrill KayGoodman, Notes from prior ED visits and [place] Controlled Substance Database  ____________________________________________   FINAL CLINICAL IMPRESSION(S) / ED DIAGNOSES  Final diagnoses:  AKI (acute kidney injury) (HCC)  LOC (loss of consciousness) (HCC)  Multiple contusions  Idiopathic hypotension      NEW MEDICATIONS STARTED DURING THIS VISIT:  New Prescriptions   No medications on file     Note:  This document was prepared using Dragon voice recognition software and may include unintentional dictation errors.    Faythe GheeFisher, Necha Harries W, PA-C 03/19/19 1947    Phineas SemenGoodman, Graydon, MD 03/19/19 [phone]

## 2019-03-19 NOTE — Progress Notes (Signed)
In to see pt to give medication. Upon entering the room, pt is sleeping (knocked out) arousable but goes right back to sleep. . Pt with little pill case with unknown medications. This Rn doesn't know what the patient has taken. Notified Camera operator of the pills. Sent to pharmacy. MD notified. Will monitor and assess the pt throughout the evening

## 2019-03-19 NOTE — ED Notes (Addendum)
ED TO INPATIENT HANDOFF REPORT  ED Nurse Name and Phone #:  Gershon Mussel RN  [phone]  S Name/Age/Gender Katrina Booker 43 y.o. female Room/Bed: ED16A/ED16A  Code Status   Code Status: Full Code  Home/SNF/Other Home Patient oriented to: self, place, time and situation Is this baseline? Yes   Triage Complete: Triage complete  Chief Complaint fall  Triage Note Pt fell down 11 stairs last night. Reports was at bottom of stairs last night and was woken to parents taking dogs out.  Per pt was unconscious until this morning from fall.  Pain to neck and lower back. Tingling in both hands, none in legs.  philly collar applied.     Allergies Allergies  Allergen Reactions  . Amlodipine Nausea And Vomiting  . Hydrocodone Itching    hives    Level of Care/Admitting Diagnosis ED Disposition    ED Disposition Condition [facility]: [place] [[id number]]  Level of Care: Telemetry [5]  Covid Evaluation: Person Under Investigation (PUI)  Isolation Risk Level: Low Risk/Droplet (Less than 4L [place] supplementation)  Diagnosis: Syncope and collapse [780.2.ICD-9-CM]  Admitting Physician: Saundra Shelling [[id number]]  Attending Physician: Saundra Shelling [[id number]]  PT Class (Do Not Modify): Observation [104]  PT Acc Code (Do Not Modify): Observation [10022]       B Medical/Surgery History Past Medical History:  Diagnosis Date  . ADHD (attention deficit hyperactivity disorder)   . Asthma   . Bipolar 1 disorder ([place])   . Diabetes mellitus   . GERD (gastroesophageal reflux disease)    Past Surgical History:  Procedure Laterality Date  . ABDOMINAL HYSTERECTOMY    . ABDOMINAL SURGERY    . BACK SURGERY    . HERNIA REPAIR    . INCONTINENCE SURGERY    . OTHER SURGICAL HISTORY     tumor excision on face  . TUBAL LIGATION       A IV Location/Drains/Wounds Patient Lines/Drains/Airways Status   Active Line/Drains/Airways    Name:   Placement date:    Placement time:   Site:   Days:   Peripheral IV 03/19/19 Left Hand   03/19/19    1937    Hand   less than 1          Intake/Output Last 24 hours No intake or output data in the 24 hours ending 03/19/19 2016  Labs/Imaging Results for orders placed or performed during the hospital encounter of 03/19/19 (from the past 48 hour(s))  Comprehensive metabolic panel     Status: Abnormal   Collection Time: 03/19/19  3:25 PM  Result Value Ref Range   Sodium 139 135 - 145 mmol/L   Potassium 4.4 3.5 - 5.1 mmol/L   Chloride 106 98 - 111 mmol/L   CO2 20 (L) 22 - 32 mmol/L   Glucose, Bld 128 (H) 70 - 99 mg/dL   BUN 35 (H) 6 - 20 mg/dL   Creatinine, Ser 2.97 (H) 0.44 - 1.00 mg/dL   Calcium 9.6 8.9 - 10.3 mg/dL   Total Protein 7.3 6.5 - 8.1 g/dL   Albumin 4.0 3.5 - 5.0 g/dL   AST 21 15 - 41 U/L   ALT 33 0 - 44 U/L   Alkaline Phosphatase 84 38 - 126 U/L   Total Bilirubin 0.8 0.3 - 1.2 mg/dL   GFR calc non Af Amer 19 (L) >60 mL/min   GFR calc Af Amer 22 (L) >60 mL/min   Anion gap 13 5 -  15    Comment: Performed at [facility], [address] [place], [place] [address]  Troponin I (High Sensitivity)     Status: Abnormal   Collection Time: 03/19/19  3:25 PM  Result Value Ref Range   Troponin I (High Sensitivity) 19 (H) <18 ng/L    Comment: (NOTE) Elevated high sensitivity troponin I (hsTnI) values and significant  changes across serial measurements may suggest ACS but many other  chronic and acute conditions are known to elevate hsTnI results.  Refer to the "Links" section for chest pain algorithms and additional  guidance. Performed at [facility], [address] [place], [place] [address]   Lactic acid, plasma     Status: None   Collection Time: 03/19/19  3:25 PM  Result Value Ref Range   Lactic Acid, Venous 1.6 0.5 - 1.9 mmol/L    Comment: Performed at [facility], [address] [place], [place] [address]  CBC with Differential     Status: Abnormal    Collection Time: 03/19/19  3:25 PM  Result Value Ref Range   WBC 11.9 (H) 4.0 - 10.5 K/uL   RBC 4.42 3.87 - 5.11 MIL/uL   Hemoglobin 12.0 12.0 - 15.0 g/dL   HCT 91.4 78.2 - 95.6 %   MCV 83.3 80.0 - 100.0 fL   MCH 27.1 26.0 - 34.0 pg   MCHC 32.6 30.0 - 36.0 g/dL   RDW 21.3 08.6 - 57.8 %   Platelets 350 150 - 400 K/uL   nRBC 0.0 0.0 - 0.2 %   Neutrophils Relative % 61 %   Neutro Abs 7.3 1.7 - 7.7 K/uL   Lymphocytes Relative 28 %   Lymphs Abs 3.4 0.7 - 4.0 K/uL   Monocytes Relative 10 %   Monocytes Absolute 1.1 (H) 0.1 - 1.0 K/uL   Eosinophils Relative 1 %   Eosinophils Absolute 0.1 0.0 - 0.5 K/uL   Basophils Relative 0 %   Basophils Absolute 0.1 0.0 - 0.1 K/uL   Immature Granulocytes 0 %   Abs Immature Granulocytes 0.04 0.00 - 0.07 K/uL    Comment: Performed at [facility], [address], [place], [place] [address]  Urine Drug Screen, Qualitative ([facility] only)     Status: Abnormal   Collection Time: 03/19/19  4:53 PM  Result Value Ref Range   Tricyclic, Ur Screen POSITIVE (A) NONE DETECTED   Amphetamines, Ur Screen POSITIVE (A) NONE DETECTED   MDMA (Ecstasy)Ur Screen NONE DETECTED NONE DETECTED   Cocaine Metabolite,Ur [place] NONE DETECTED NONE DETECTED   Opiate, Ur Screen NONE DETECTED NONE DETECTED   Phencyclidine (PCP) Ur S NONE DETECTED NONE DETECTED   Cannabinoid 50 Ng, Ur [place] NONE DETECTED NONE DETECTED   Barbiturates, Ur Screen NONE DETECTED NONE DETECTED   Benzodiazepine, Ur Scrn POSITIVE (A) NONE DETECTED   Methadone Scn, Ur NONE DETECTED NONE DETECTED    Comment: (NOTE) Tricyclics + metabolites, urine    Cutoff 1000 ng/mL Amphetamines + metabolites, urine  Cutoff 1000 ng/mL MDMA (Ecstasy), urine              Cutoff 500 ng/mL Cocaine Metabolite, urine          Cutoff 300 ng/mL Opiate + metabolites, urine        Cutoff 300 ng/mL Phencyclidine (PCP), urine         Cutoff 25 ng/mL Cannabinoid, urine                 Cutoff  50 ng/mL Barbiturates + metabolites,  urine  Cutoff 200 ng/mL Benzodiazepine, urine              Cutoff 200 ng/mL Methadone, urine                   Cutoff 300 ng/mL The urine drug screen provides only a preliminary, unconfirmed analytical test result and should not be used for non-medical purposes. Clinical consideration and professional judgment should be applied to any positive drug screen result due to possible interfering substances. A more specific alternate chemical method must be used in order to obtain a confirmed analytical result. Gas chromatography / mass spectrometry (GC/MS) is the preferred confirmat ory method. Performed at [facility], [address] [place], [place] [address]   Urinalysis, Complete w Microscopic     Status: Abnormal   Collection Time: 03/19/19  4:53 PM  Result Value Ref Range   Color, Urine AMBER (A) YELLOW    Comment: BIOCHEMICALS MAY BE AFFECTED BY COLOR   APPearance CLOUDY (A) CLEAR   Specific Gravity, Urine 1.027 1.005 - 1.030   pH 5.0 5.0 - 8.0   Glucose, UA NEGATIVE NEGATIVE mg/dL   Hgb urine dipstick NEGATIVE NEGATIVE   Bilirubin Urine MODERATE (A) NEGATIVE   Ketones, ur NEGATIVE NEGATIVE mg/dL   Protein, ur 604100 (A) NEGATIVE mg/dL   Nitrite NEGATIVE NEGATIVE   Leukocytes,Ua NEGATIVE NEGATIVE   RBC / HPF 11-20 0 - 5 RBC/hpf   WBC, UA 21-50 0 - 5 WBC/hpf   Bacteria, UA RARE (A) NONE SEEN   Squamous Epithelial / LPF 0-5 0 - 5   Mucus PRESENT    Hyaline Casts, UA PRESENT    Non Squamous Epithelial PRESENT (A) NONE SEEN    Comment: Performed at [facility], [address] [place], [place] [address]  Troponin I (High Sensitivity)     Status: None   Collection Time: 03/19/19  5:05 PM  Result Value Ref Range   Troponin I (High Sensitivity) 16 <18 ng/L    Comment: (NOTE) Elevated high sensitivity troponin I (hsTnI) values and significant  changes across serial measurements may suggest ACS but many other  chronic and acute conditions are known to elevate  hsTnI results.  Refer to the "Links" section for chest pain algorithms and additional  guidance. Performed at [facility], [address] [place], [place] [address]   Lactic acid, plasma     Status: None   Collection Time: 03/19/19  5:05 PM  Result Value Ref Range   Lactic Acid, Venous 1.2 0.5 - 1.9 mmol/L    Comment: Performed at [facility], [address] [place], [place] [address]  CK     Status: None   Collection Time: 03/19/19  5:05 PM  Result Value Ref Range   Total CK 127 38 - 234 U/L    Comment: Performed at [facility], [address] [place], [place] [address]   Ct Head Wo Contrast  Result Date: 03/19/2019 CLINICAL DATA:  Trauma, fall, headache, neck pain EXAM: CT HEAD WITHOUT CONTRAST CT CERVICAL SPINE WITHOUT CONTRAST TECHNIQUE: Multidetector CT imaging of the head and cervical spine was performed following the standard protocol without intravenous contrast. Multiplanar CT image reconstructions of the cervical spine were also generated. COMPARISON:  None. FINDINGS: CT HEAD FINDINGS Brain: No evidence of acute infarction, hemorrhage, hydrocephalus, extra-axial collection or mass lesion/mass effect. Vascular: No hyperdense vessel or unexpected calcification. Skull: Normal. Negative for fracture or  focal lesion. Sinuses/Orbits: No acute finding. Other: None. CT CERVICAL SPINE FINDINGS Alignment: Normal. Skull base and vertebrae: No acute fracture. No primary bone lesion or focal pathologic process. Soft tissues and spinal canal: No prevertebral fluid or swelling. No visible canal hematoma. Disc levels: Status post anterior cervical discectomy and fusion of C5-6 with bony incorporation of the disc space. Upper chest: Negative. Other: None. IMPRESSION: 1.  No acute intracranial pathology. 2. No fracture or static subluxation of the cervical spine. Status post anterior cervical discectomy and fusion of C5-6 with bony incorporation of the disc space.  Electronically Signed   By: Lauralyn PrimesAlex  Bibbey M.D.   On: 03/19/2019 16:16   Ct Cervical Spine Wo Contrast  Result Date: 03/19/2019 CLINICAL DATA:  Trauma, fall, headache, neck pain EXAM: CT HEAD WITHOUT CONTRAST CT CERVICAL SPINE WITHOUT CONTRAST TECHNIQUE: Multidetector CT imaging of the head and cervical spine was performed following the standard protocol without intravenous contrast. Multiplanar CT image reconstructions of the cervical spine were also generated. COMPARISON:  None. FINDINGS: CT HEAD FINDINGS Brain: No evidence of acute infarction, hemorrhage, hydrocephalus, extra-axial collection or mass lesion/mass effect. Vascular: No hyperdense vessel or unexpected calcification. Skull: Normal. Negative for fracture or focal lesion. Sinuses/Orbits: No acute finding. Other: None. CT CERVICAL SPINE FINDINGS Alignment: Normal. Skull base and vertebrae: No acute fracture. No primary bone lesion or focal pathologic process. Soft tissues and spinal canal: No prevertebral fluid or swelling. No visible canal hematoma. Disc levels: Status post anterior cervical discectomy and fusion of C5-6 with bony incorporation of the disc space. Upper chest: Negative. Other: None. IMPRESSION: 1.  No acute intracranial pathology. 2. No fracture or static subluxation of the cervical spine. Status post anterior cervical discectomy and fusion of C5-6 with bony incorporation of the disc space. Electronically Signed   By: Lauralyn PrimesAlex  Bibbey M.D.   On: 03/19/2019 16:16   Ct Thoracic Spine Wo Contrast  Result Date: 03/19/2019 CLINICAL DATA:  Initial evaluation for acute trauma, fall, back pain. EXAM: CT THORACIC AND LUMBAR SPINE WITHOUT CONTRAST TECHNIQUE: Multidetector CT imaging of the thoracic and lumbar spine was performed without contrast. Multiplanar CT image reconstructions were also generated. COMPARISON:  None available. FINDINGS: CT THORACIC SPINE FINDINGS Alignment: Vertebral bodies normally aligned with preservation of the normal  thoracic kyphosis. No listhesis or malalignment. Vertebrae: Vertebral body heights maintained without evidence for acute or chronic fracture. No discrete lytic or blastic osseous lesions. Paraspinal and other soft tissues: Paraspinous soft tissues demonstrate no acute finding. Visualized visceral structures within normal limits. Partially visualized lungs are grossly clear. Mild atherosclerotic change noted within the visualized aorta. Disc levels: ACDF at C5-6 noted, partially visualized. No significant disc pathology within the thoracic spine. No canal or foraminal stenosis. CT LUMBAR SPINE FINDINGS Segmentation: Standard. Lowest well-formed disc labeled the L5-S1 level. Alignment: Vertebral bodies normally aligned with preservation of the normal lumbar lordosis. No listhesis or malalignment. Vertebrae: Vertebral body heights maintained without evidence for acute or chronic fracture. Visualized sacrum and pelvis intact. SI joints approximated symmetric. No discrete osseous lesions. Paraspinal and other soft tissues: Paraspinous soft tissues demonstrate no acute finding. Chronic postoperative changes noted within the lower posterior paraspinous soft tissues. Mild aorto bi-iliac atherosclerotic disease. Visualized visceral structures within normal limits. Disc levels: Prior posterior and interbody fusion with posterior decompression at L5-S1. No hardware complication. No other significant disc pathology within the lumbar spine. No canal or foraminal stenosis. IMPRESSION: CT THORACIC SPINE IMPRESSION No acute traumatic injury within the thoracic spine. CT  LUMBAR SPINE IMPRESSION 1. No acute traumatic injury within the lumbar spine. 2. Prior posterior and interbody fusion at L5-S1. No hardware complication. Electronically Signed   By: Rise MuBenjamin  McClintock M.D.   On: 03/19/2019 17:00   Ct Lumbar Spine Wo Contrast  Result Date: 03/19/2019 CLINICAL DATA:  Initial evaluation for acute trauma, fall, back pain. EXAM: CT  THORACIC AND LUMBAR SPINE WITHOUT CONTRAST TECHNIQUE: Multidetector CT imaging of the thoracic and lumbar spine was performed without contrast. Multiplanar CT image reconstructions were also generated. COMPARISON:  None available. FINDINGS: CT THORACIC SPINE FINDINGS Alignment: Vertebral bodies normally aligned with preservation of the normal thoracic kyphosis. No listhesis or malalignment. Vertebrae: Vertebral body heights maintained without evidence for acute or chronic fracture. No discrete lytic or blastic osseous lesions. Paraspinal and other soft tissues: Paraspinous soft tissues demonstrate no acute finding. Visualized visceral structures within normal limits. Partially visualized lungs are grossly clear. Mild atherosclerotic change noted within the visualized aorta. Disc levels: ACDF at C5-6 noted, partially visualized. No significant disc pathology within the thoracic spine. No canal or foraminal stenosis. CT LUMBAR SPINE FINDINGS Segmentation: Standard. Lowest well-formed disc labeled the L5-S1 level. Alignment: Vertebral bodies normally aligned with preservation of the normal lumbar lordosis. No listhesis or malalignment. Vertebrae: Vertebral body heights maintained without evidence for acute or chronic fracture. Visualized sacrum and pelvis intact. SI joints approximated symmetric. No discrete osseous lesions. Paraspinal and other soft tissues: Paraspinous soft tissues demonstrate no acute finding. Chronic postoperative changes noted within the lower posterior paraspinous soft tissues. Mild aorto bi-iliac atherosclerotic disease. Visualized visceral structures within normal limits. Disc levels: Prior posterior and interbody fusion with posterior decompression at L5-S1. No hardware complication. No other significant disc pathology within the lumbar spine. No canal or foraminal stenosis. IMPRESSION: CT THORACIC SPINE IMPRESSION No acute traumatic injury within the thoracic spine. CT LUMBAR SPINE IMPRESSION  1. No acute traumatic injury within the lumbar spine. 2. Prior posterior and interbody fusion at L5-S1. No hardware complication. Electronically Signed   By: Rise MuBenjamin  McClintock M.D.   On: 03/19/2019 17:00    Pending Labs Unresulted Labs (From admission, onward)    Start     Ordered   03/20/19 0500  Basic metabolic panel  Tomorrow morning,   STAT     03/19/19 1757   03/20/19 0500  CBC  Tomorrow morning,   STAT     03/19/19 1757   03/19/19 2100  Troponin I (High Sensitivity)  Now then every 6 hours,   STAT    Question Answer Comment  Indication Other   Specify indication syncope      03/19/19 1822   03/19/19 1829  Hemoglobin A1c  Once,   STAT    Comments: To assess prior glycemic control    03/19/19 1828   03/19/19 1756  HIV antibody (Routine Testing)  Once,   STAT     03/19/19 1757   03/19/19 1756  CBC  (heparin)  Once,   STAT    Comments: Baseline for heparin therapy IF NOT ALREADY DRAWN.  Notify MD if PLT < 100 K.    03/19/19 1757   03/19/19 1756  Creatinine, serum  (heparin)  Once,   STAT    Comments: Baseline for heparin therapy IF NOT ALREADY DRAWN.    03/19/19 1757   03/19/19 1741  Novel Coronavirus,NAA,(SEND-OUT TO REF LAB - TAT 24-48 hrs); Hosp Order  (Asymptomatic Patients Labs)  Once,   STAT    Question:  Rule Out  Answer:  Yes   03/19/19 1740          Vitals/Pain Today's Vitals   03/19/19 1730 03/19/19 1814 03/19/19 1936 03/19/19 2014  BP: (!) 68/47 91/74 (!) 95/54 94/76  Pulse: 86 82 73 80  Resp: (!) 23 20 (!) 23 (!) 22  Temp:      TempSrc:      SpO2: 96% 100% 98% 98%  Weight:      Height:      PainSc: Isolation Precautions No active isolations  Medications Medications  ALPRAZolam (XANAX) tablet 0.25 mg (has no administration in time range)  amphetamine-dextroamphetamine (ADDERALL XR) 24 hr capsule 1 capsule (has no administration in time range)  amphetamine-dextroamphetamine (ADDERALL XR) 24 hr capsule 30 mg (has no administration  in time range)  FLUoxetine (PROZAC) capsule 40 mg (has no administration in time range)  OLANZapine (ZYPREXA) tablet 10 mg (has no administration in time range)  linagliptin (TRADJENTA) tablet 5 mg (has no administration in time range)  pantoprazole (PROTONIX) EC tablet 40 mg (has no administration in time range)  lamoTRIgine (LAMICTAL) tablet 200 mg (has no administration in time range)  albuterol (VENTOLIN HFA) 108 (90 Base) MCG/ACT inhaler 2 puff (has no administration in time range)  mometasone-formoterol (DULERA) 100-5 MCG/ACT inhaler 2 puff (has no administration in time range)  heparin injection 5,000 Units (has no administration in time range)  0.9 %  sodium chloride infusion (has no administration in time range)  acetaminophen (TYLENOL) tablet 650 mg (has no administration in time range)    Or  acetaminophen (TYLENOL) suppository 650 mg (has no administration in time range)  senna-docusate (Senokot-S) tablet 1 tablet (has no administration in time range)  ondansetron (ZOFRAN) tablet 4 mg (has no administration in time range)    Or  ondansetron (ZOFRAN) injection 4 mg (has no administration in time range)  nicotine (NICODERM CQ - dosed in mg/24 hours) patch 21 mg (has no administration in time range)  insulin aspart (novoLOG) injection 0-15 Units (has no administration in time range)  insulin aspart (novoLOG) injection 0-5 Units (has no administration in time range)  sodium chloride 0.9 % bolus 1,000 mL (0 mLs Intravenous Stopped 03/19/19 1707)  sodium chloride 0.9 % bolus 1,000 mL (1,000 mLs Intravenous New Bag/Given 03/19/19 1707)  sodium chloride 0.9 % bolus 1,000 mL (1,000 mLs Intravenous Bolus 03/19/19 1738)  acetaminophen (TYLENOL) tablet 1,000 mg (1,000 mg Oral Given 03/19/19 1807)    Mobility walks Low fall risk   Focused Assessments Cardiac Assessment Handoff:    Lab Results  Component Value Date   CKTOTAL 127 03/19/2019   CKMB 0.8 05/26/2012   TROPONINI < 0.02  05/26/2012   No results found for: DDIMER Does the Patient currently have chest pain? No     R Recommendations: See Admitting Provider Note  Report given to:  Marcelino Duster RN on 2A  Additional Notes:

## 2019-03-19 NOTE — ED Triage Notes (Signed)
Pt fell down 11 stairs last night. Reports was at bottom of stairs last night and was woken to parents taking dogs out.  Per pt was unconscious until this morning from fall.  Pain to neck and lower back. Tingling in both hands, none in legs.  philly collar applied.

## 2019-03-19 NOTE — H&P (Addendum)
[facility] [place] [facility]   PATIENT NAME: Cathey EndowCrystal Magnone    MR#:  [id number]  DATE OF BIRTH:  01-Feb-1976  DATE OF ADMISSION:  03/19/2019  PRIMARY CARE PHYSICIAN: Dorothey BasemanBronstein, David, MD   REQUESTING/REFERRING PHYSICIAN:   CHIEF COMPLAINT:   Chief Complaint  Patient presents with  . Fall    HISTORY OF PRESENT ILLNESS: Cathey EndowCrystal Space  is a 43 y.o. female with a known history of attention deficit hyperactivity disorder, bronchial asthma, bipolar disorder, type 2 diabetes mellitus, GERD presented to the emergency room for fall.  She was going up the staircase she passed out.  She felt dizzy.  Her blood pressure has been low when she presented to the emergency room and she was given IV fluids.  Patient was worked up with CT head which showed no acute abnormality.. And is an active smoker and tried Chantix in the past.  No complaints of chest pain.  No fever and chills.  Patient was worked up with CT thoracic spine lumbar spine which showed no acute fractures.  PAST MEDICAL HISTORY:   Past Medical History:  Diagnosis Date  . ADHD (attention deficit hyperactivity disorder)   . Asthma   . Bipolar 1 disorder (HCC)   . Diabetes mellitus   . GERD (gastroesophageal reflux disease)     PAST SURGICAL HISTORY:  Past Surgical History:  Procedure Laterality Date  . ABDOMINAL HYSTERECTOMY    . ABDOMINAL SURGERY    . BACK SURGERY    . HERNIA REPAIR    . INCONTINENCE SURGERY    . OTHER SURGICAL HISTORY     tumor excision on face  . TUBAL LIGATION      SOCIAL HISTORY:  Social History   Tobacco Use  . Smoking status: Current Every Day Smoker    Packs/day: 1.00    Types: Cigarettes  . Smokeless tobacco: Never Used  Substance Use Topics  . Alcohol use: No    Alcohol/week: 0.0 standard drinks    FAMILY HISTORY:  Family History  Problem Relation Age of Onset  . Hypothyroidism Mother   . Diabetes Father   . Heart disease Father     DRUG  ALLERGIES:  Allergies  Allergen Reactions  . Amlodipine Nausea And Vomiting  . Hydrocodone Itching    hives    REVIEW OF SYSTEMS:   CONSTITUTIONAL: No fever, fatigue or weakness.  EYES: No blurred or double vision.  EARS, NOSE, AND THROAT: No tinnitus or ear pain.  RESPIRATORY: No cough, shortness of breath, wheezing or hemoptysis.  CARDIOVASCULAR: No chest pain, orthopnea, edema.  GASTROINTESTINAL: No nausea, vomiting, diarrhea or abdominal pain.  GENITOURINARY: No dysuria, hematuria.  ENDOCRINE: No polyuria, nocturia,  HEMATOLOGY: No anemia, easy bruising or bleeding SKIN: No rash or lesion. MUSCULOSKELETAL: No joint pain or arthritis.   NEUROLOGIC: No tingling, numbness, weakness.  Dizziness present PSYCHIATRY: No anxiety or depression.   MEDICATIONS AT HOME:  Prior to Admission medications   Medication Sig Start Date End Date Taking? Authorizing Provider  albuterol (PROVENTIL HFA;VENTOLIN HFA) 108 (90 BASE) MCG/ACT inhaler Inhale 2 puffs into the lungs every 6 (six) hours as needed. For ashtma    [provider]  ALPRAZolam Prudy Feeler(XANAX) 0.5 MG tablet TAKE 1 TABLET(0.5 MG) BY MOUTH DAILY AS NEEDED FOR ANXIETY 03/17/19   Hurst, Glade Nurseeresa T, PA-C  amphetamine-dextroamphetamine (ADDERALL XR) 25 MG 24 hr capsule Take 1 capsule by mouth daily after lunch. 03/14/19   Chauncey MannJennings, Glenn E, MD  amphetamine-dextroamphetamine (ADDERALL  XR) 30 MG 24 hr capsule Take 1 capsule (30 mg total) by mouth daily. 02/13/19   [name], [name], PA-C  amphetamine-dextroamphetamine (ADDERALL XR) 30 MG 24 hr capsule Take 1 capsule (30 mg total) by mouth daily after breakfast. 03/14/19   Chauncey MannJennings, Glenn E, MD  budesonide-formoterol [facility](SYMBICORT) 80-4.5 MCG/ACT inhaler Inhale 2 puffs into the lungs 2 (two) times daily.    [provider]  busPIRone (BUSPAR) 30 MG tablet TAKE 1 TABLET(30 MG) BY MOUTH TWICE DAILY 02/03/19   Hurst, [name], PA-C  CHANTIX CONTINUING MONTH PAK 1 MG tablet TAKE 1 TABLET BY MOUTH  TWICE DAILY( EVERY 12 HOURS) 02/04/19   Hurst, [name], PA-C  fexofenadine (ALLEGRA) 180 MG tablet Take by mouth. 06/17/18   [provider]  FLUoxetine (PROZAC) 40 MG capsule TAKE 2 CAPSULES(80 MG) BY MOUTH DAILY 02/03/19   Claybon JabsHurst, [name], PA-C  Fluticasone Propionate (FLONASE NA) Place into the nose.    [provider]  gabapentin (NEURONTIN) 800 MG tablet TAKE 1 TABLET(800 MG) BY MOUTH THREE TIMES DAILY 06/07/18   [provider]  guaiFENesin-codeine 100-10 MG/5ML syrup Take 5 mLs by mouth every 4 (four) hours as needed for cough. Patient not taking: Reported on 10/03/2018 10/27/17   Faythe GheeFisher, Susan W, PA-C  ibuprofen (ADVIL,MOTRIN) 800 MG tablet Take 1 tablet (800 mg total) by mouth every 8 (eight) hours as needed for moderate pain. 09/22/18   Irean HongSung, Jade J, MD  lamoTRIgine (LAMICTAL) 200 MG tablet TAKE 1 TABLET BY MOUTH TWICE DAILY 02/17/19   [name], [name], PA-C  linagliptin (TRADJENTA) 5 MG TABS tablet TAKE 1 TABLET(5 MG) BY MOUTH EVERY DAY 07/23/18   [provider]  lisinopril (PRINIVIL,ZESTRIL) 20 MG tablet Take 20 mg by mouth daily.    [provider]  metFORMIN (GLUCOPHAGE) 500 MG tablet Take 500 mg by mouth 2 (two) times daily with a meal.    [provider]  OLANZapine (ZYPREXA) 10 MG tablet Take 1 tablet (10 mg total) by mouth at bedtime. 07/22/18   [name], [name], PA-C  omeprazole (PRILOSEC) 20 MG capsule Take 20 mg by mouth daily.    [provider]  ondansetron (ZOFRAN) 4 MG tablet Take by mouth. 06/17/18   [provider]  oxyCODONE-acetaminophen (PERCOCET/ROXICET) 5-325 MG tablet Take 1 tablet by mouth every 4 (four) hours as needed for severe pain. Patient not taking: Reported on 10/03/2018 09/22/18   Irean HongSung, Jade J, MD  potassium chloride (K-DUR) 10 MEQ tablet TAKE 1 TABLET(10 MEQ) BY MOUTH EVERY DAY 06/07/18   [provider]  predniSONE (STERAPRED UNI-PAK 48 TAB) 10 MG (48) TBPK tablet Take 6 pills for 2 days  then decrease by 1 pill every 2 days Patient not taking: Reported on 10/03/2018 10/27/17   Faythe GheeFisher, Susan W, PA-C  promethazine (PHENERGAN) 25 MG tablet TAKE 1 TABLET BY MOUTH EVERY 4 HOURS AS NEEDED FOR NAUSEA 06/07/18   [provider]  propranolol (INDERAL) 20 MG tablet 1 po q am for 4 days, then may increase to bid. 01/20/19   [name], [name], PA-C  varenicline (CHANTIX STARTING MONTH PAK) 0.5 MG X 11 & 1 MG X 42 tablet Take one 0.5 mg tablet by mouth once daily for 3 days, then increase to one 0.5 mg tablet twice daily for 4 days, then increase to one 1 mg tablet twice daily. Patient not taking: Reported on 02/18/2019 12/09/18   [name], [name], PA-C  zolpidem (AMBIEN CR) 12.5 MG CR  tablet Take 1 tablet (12.5 mg total) by mouth at bedtime as needed for sleep. 01/17/19   Donnal Moat T, PA-C  zolpidem (AMBIEN CR) 12.5 MG CR tablet TAKE 1 TABLET(12.5 MG) BY MOUTH AT BEDTIME AS NEEDED FOR SLEEP 03/17/19   Donnal Moat T, PA-C      PHYSICAL EXAMINATION:   VITAL SIGNS: Blood pressure (!) 68/47, pulse 86, temperature 98.6 F (37 C), temperature source Oral, resp. rate (!) 23, height 5\' 5"  (1.651 m), weight 90.7 kg, SpO2 96 %.  GENERAL:  43 y.o.-year-old patient lying in the bed with no acute distress.  EYES: Pupils equal, round, reactive to light and accommodation. No scleral icterus. Extraocular muscles intact.  HEENT: Head atraumatic, normocephalic. Oropharynx and nasopharynx clear.  NECK:  Supple, no jugular venous distention. No thyroid enlargement, no tenderness.  LUNGS: Normal breath sounds bilaterally, no wheezing, rales,rhonchi or crepitation. No use of accessory muscles of respiration.  CARDIOVASCULAR: S1, S2 normal. No murmurs, rubs, or gallops.  ABDOMEN: Soft, nontender, nondistended. Bowel sounds present. No organomegaly or mass.  EXTREMITIES: No pedal edema, cyanosis, or clubbing.  NEUROLOGIC: Cranial nerves II through XII are intact. Muscle strength 5/5 in all extremities.  Sensation intact. Gait not checked.  PSYCHIATRIC: The patient is alert and oriented x 3.  SKIN: No obvious rash, lesion, or ulcer.   LABORATORY PANEL:   CBC Recent Labs  Lab 03/19/19 1525  WBC 11.9*  HGB 12.0  HCT 36.8  PLT 350  MCV 83.3  MCH 27.1  MCHC 32.6  RDW 15.0  LYMPHSABS 3.4  MONOABS 1.1*  EOSABS 0.1  BASOSABS 0.1   ------------------------------------------------------------------------------------------------------------------  Chemistries  Recent Labs  Lab 03/19/19 1525  NA 139  K 4.4  CL 106  CO2 20*  GLUCOSE 128*  BUN 35*  CREATININE 2.97*  CALCIUM 9.6  AST 21  ALT 33  ALKPHOS 84  BILITOT 0.8   ------------------------------------------------------------------------------------------------------------------ estimated creatinine clearance is 27.7 mL/min (A) (by C-G formula based on SCr of 2.97 mg/dL (H)). ------------------------------------------------------------------------------------------------------------------ No results for input(s): TSH, T4TOTAL, T3FREE, THYROIDAB in the last 72 hours.  Invalid input(s): FREET3   Coagulation profile No results for input(s): INR, PROTIME in the last 168 hours. ------------------------------------------------------------------------------------------------------------------- No results for input(s): DDIMER in the last 72 hours. -------------------------------------------------------------------------------------------------------------------  Cardiac Enzymes No results for input(s): CKMB, TROPONINI, MYOGLOBIN in the last 168 hours.  Invalid input(s): CK ------------------------------------------------------------------------------------------------------------------ Invalid input(s): POCBNP  ---------------------------------------------------------------------------------------------------------------  Urinalysis    Component Value Date/Time   COLORURINE AMBER (A) 03/19/2019 1653   APPEARANCEUR  CLOUDY (A) 03/19/2019 1653   APPEARANCEUR Clear 05/23/2014 0810   LABSPEC 1.027 03/19/2019 1653   LABSPEC 1.013 05/23/2014 0810   PHURINE 5.0 03/19/2019 1653   GLUCOSEU NEGATIVE 03/19/2019 1653   GLUCOSEU Negative 05/23/2014 0810   HGBUR NEGATIVE 03/19/2019 1653   BILIRUBINUR MODERATE (A) 03/19/2019 1653   BILIRUBINUR Negative 05/23/2014 0810   KETONESUR NEGATIVE 03/19/2019 1653   PROTEINUR 100 (A) 03/19/2019 1653   UROBILINOGEN 0.2 02/28/2012 2345   NITRITE NEGATIVE 03/19/2019 1653   LEUKOCYTESUR NEGATIVE 03/19/2019 1653   LEUKOCYTESUR Negative 05/23/2014 0810     RADIOLOGY: Ct Head Wo Contrast  Result Date: 03/19/2019 CLINICAL DATA:  Trauma, fall, headache, neck pain EXAM: CT HEAD WITHOUT CONTRAST CT CERVICAL SPINE WITHOUT CONTRAST TECHNIQUE: Multidetector CT imaging of the head and cervical spine was performed following the standard protocol without intravenous contrast. Multiplanar CT image reconstructions of the cervical spine were also generated. COMPARISON:  None. FINDINGS: CT HEAD FINDINGS Brain: No  evidence of acute infarction, hemorrhage, hydrocephalus, extra-axial collection or mass lesion/mass effect. Vascular: No hyperdense vessel or unexpected calcification. Skull: Normal. Negative for fracture or focal lesion. Sinuses/Orbits: No acute finding. Other: None. CT CERVICAL SPINE FINDINGS Alignment: Normal. Skull base and vertebrae: No acute fracture. No primary bone lesion or focal pathologic process. Soft tissues and spinal canal: No prevertebral fluid or swelling. No visible canal hematoma. Disc levels: Status post anterior cervical discectomy and fusion of C5-6 with bony incorporation of the disc space. Upper chest: Negative. Other: None. IMPRESSION: 1.  No acute intracranial pathology. 2. No fracture or static subluxation of the cervical spine. Status post anterior cervical discectomy and fusion of C5-6 with bony incorporation of the disc space. Electronically Signed   By: Lauralyn PrimesAlex   Bibbey M.D.   On: 03/19/2019 16:16   Ct Cervical Spine Wo Contrast  Result Date: 03/19/2019 CLINICAL DATA:  Trauma, fall, headache, neck pain EXAM: CT HEAD WITHOUT CONTRAST CT CERVICAL SPINE WITHOUT CONTRAST TECHNIQUE: Multidetector CT imaging of the head and cervical spine was performed following the standard protocol without intravenous contrast. Multiplanar CT image reconstructions of the cervical spine were also generated. COMPARISON:  None. FINDINGS: CT HEAD FINDINGS Brain: No evidence of acute infarction, hemorrhage, hydrocephalus, extra-axial collection or mass lesion/mass effect. Vascular: No hyperdense vessel or unexpected calcification. Skull: Normal. Negative for fracture or focal lesion. Sinuses/Orbits: No acute finding. Other: None. CT CERVICAL SPINE FINDINGS Alignment: Normal. Skull base and vertebrae: No acute fracture. No primary bone lesion or focal pathologic process. Soft tissues and spinal canal: No prevertebral fluid or swelling. No visible canal hematoma. Disc levels: Status post anterior cervical discectomy and fusion of C5-6 with bony incorporation of the disc space. Upper chest: Negative. Other: None. IMPRESSION: 1.  No acute intracranial pathology. 2. No fracture or static subluxation of the cervical spine. Status post anterior cervical discectomy and fusion of C5-6 with bony incorporation of the disc space. Electronically Signed   By: Lauralyn PrimesAlex  Bibbey M.D.   On: 03/19/2019 16:16   Ct Thoracic Spine Wo Contrast  Result Date: 03/19/2019 CLINICAL DATA:  Initial evaluation for acute trauma, fall, back pain. EXAM: CT THORACIC AND LUMBAR SPINE WITHOUT CONTRAST TECHNIQUE: Multidetector CT imaging of the thoracic and lumbar spine was performed without contrast. Multiplanar CT image reconstructions were also generated. COMPARISON:  None available. FINDINGS: CT THORACIC SPINE FINDINGS Alignment: Vertebral bodies normally aligned with preservation of the normal thoracic kyphosis. No listhesis or  malalignment. Vertebrae: Vertebral body heights maintained without evidence for acute or chronic fracture. No discrete lytic or blastic osseous lesions. Paraspinal and other soft tissues: Paraspinous soft tissues demonstrate no acute finding. Visualized visceral structures within normal limits. Partially visualized lungs are grossly clear. Mild atherosclerotic change noted within the visualized aorta. Disc levels: ACDF at C5-6 noted, partially visualized. No significant disc pathology within the thoracic spine. No canal or foraminal stenosis. CT LUMBAR SPINE FINDINGS Segmentation: Standard. Lowest well-formed disc labeled the L5-S1 level. Alignment: Vertebral bodies normally aligned with preservation of the normal lumbar lordosis. No listhesis or malalignment. Vertebrae: Vertebral body heights maintained without evidence for acute or chronic fracture. Visualized sacrum and pelvis intact. SI joints approximated symmetric. No discrete osseous lesions. Paraspinal and other soft tissues: Paraspinous soft tissues demonstrate no acute finding. Chronic postoperative changes noted within the lower posterior paraspinous soft tissues. Mild aorto bi-iliac atherosclerotic disease. Visualized visceral structures within normal limits. Disc levels: Prior posterior and interbody fusion with posterior decompression at L5-S1. No hardware complication. No other significant  disc pathology within the lumbar spine. No canal or foraminal stenosis. IMPRESSION: CT THORACIC SPINE IMPRESSION No acute traumatic injury within the thoracic spine. CT LUMBAR SPINE IMPRESSION 1. No acute traumatic injury within the lumbar spine. 2. Prior posterior and interbody fusion at L5-S1. No hardware complication. Electronically Signed   By: Rise Mu M.D.   On: 03/19/2019 17:00   Ct Lumbar Spine Wo Contrast  Result Date: 03/19/2019 CLINICAL DATA:  Initial evaluation for acute trauma, fall, back pain. EXAM: CT THORACIC AND LUMBAR SPINE WITHOUT  CONTRAST TECHNIQUE: Multidetector CT imaging of the thoracic and lumbar spine was performed without contrast. Multiplanar CT image reconstructions were also generated. COMPARISON:  None available. FINDINGS: CT THORACIC SPINE FINDINGS Alignment: Vertebral bodies normally aligned with preservation of the normal thoracic kyphosis. No listhesis or malalignment. Vertebrae: Vertebral body heights maintained without evidence for acute or chronic fracture. No discrete lytic or blastic osseous lesions. Paraspinal and other soft tissues: Paraspinous soft tissues demonstrate no acute finding. Visualized visceral structures within normal limits. Partially visualized lungs are grossly clear. Mild atherosclerotic change noted within the visualized aorta. Disc levels: ACDF at C5-6 noted, partially visualized. No significant disc pathology within the thoracic spine. No canal or foraminal stenosis. CT LUMBAR SPINE FINDINGS Segmentation: Standard. Lowest well-formed disc labeled the L5-S1 level. Alignment: Vertebral bodies normally aligned with preservation of the normal lumbar lordosis. No listhesis or malalignment. Vertebrae: Vertebral body heights maintained without evidence for acute or chronic fracture. Visualized sacrum and pelvis intact. SI joints approximated symmetric. No discrete osseous lesions. Paraspinal and other soft tissues: Paraspinous soft tissues demonstrate no acute finding. Chronic postoperative changes noted within the lower posterior paraspinous soft tissues. Mild aorto bi-iliac atherosclerotic disease. Visualized visceral structures within normal limits. Disc levels: Prior posterior and interbody fusion with posterior decompression at L5-S1. No hardware complication. No other significant disc pathology within the lumbar spine. No canal or foraminal stenosis. IMPRESSION: CT THORACIC SPINE IMPRESSION No acute traumatic injury within the thoracic spine. CT LUMBAR SPINE IMPRESSION 1. No acute traumatic injury  within the lumbar spine. 2. Prior posterior and interbody fusion at L5-S1. No hardware complication. Electronically Signed   By: Rise Mu M.D.   On: 03/19/2019 17:00    EKG: Orders placed or performed during the hospital encounter of 03/19/19  . ED EKG  . ED EKG    IMPRESSION AND PLAN: 43 year old female patient with a known history of attention deficit hyperactivity disorder, bronchial asthma, bipolar disorder, type 2 diabetes mellitus, GERD presented to the emergency room for fall.    -Hypotension Could be medication induced Patient on multiple meds IV fluids No evidence of any infection for now COVID-19 test pending Check for orthostatics Lactic acid normal  -Fall Secondary to low blood pressure  -Syncope Check carotid ultrasound Check echocardiogram Cycle troponin Cardiology consult  -Acute kidney injury IV fluid hydration Secondary to dehydration Avoid nephrotoxic medication Monitor renal function  -Tobacco abuse Tobacco cessation counseled to the patient for 6 minutes Nicotine patch offered  -COVID-19 test pending Test sent Droplet precaution  All the records are reviewed and case discussed with ED provider. Management plans discussed with the patient, family and they are in agreement.  CODE STATUS: Full code    Code Status Orders  (From admission, onward)         Start     Ordered   03/19/19 1757  Full code  Continuous     03/19/19 1757        Code Status  History    This patient has a current code status but no historical code status.   Advance Care Planning Activity       TOTAL TIME TAKING CARE OF THIS PATIENT: 53 minutes.    Ihor Austin M.D on 03/19/2019 at 6:15 PM  Between 7am to 6pm - Pager - [phone]  After 6pm go to [email or web address] - password [email or web address] [facility]  [facility]  Office  [phone]  CC: Primary care physician; Dorothey Baseman, MD

## 2019-03-19 NOTE — Progress Notes (Signed)
Advanced care plan. Purpose of the Encounter: CODE STATUS Parties in Attendance: Patient Patient's Decision Capacity: Good Subjective/Patient's story:  Katrina Booker  is a 43 y.o. female with a known history of attention deficit hyperactivity disorder, bronchial asthma, bipolar disorder, type 2 diabetes mellitus, GERD presented to the emergency room for fall.  She was going up the staircase she passed out.  She felt dizzy.  Her blood pressure has been low when she presented to the emergency room and she was given IV fluids.  Patient was worked up with CT head which showed no acute abnormality.. And is an active smoker and tried Chantix in the past.  No complaints of chest pain.  No fever and chills.  Patient was worked up with CT thoracic spine lumbar spine which showed no acute fractures. Objective/Medical story Patient presented to emergency room for fall and passing out.  She needs evaluation for syncope and cardiology evaluation.  Work-up with carotid ultrasound and echocardiogram. Goals of care determination:  Advance care directives goals of care and treatment plan discussed. For now patient wants everything done which includes CPR, intubation ventilator if the need arises CODE STATUS: Full code Time spent discussing advanced care planning: 16 minutes

## 2019-03-19 NOTE — ED Notes (Signed)
Entered room to find pt sitting on side of bed with spray of blood across the floor and IV catheter hanging at bedside. Pt states she tried to sit up and her knee caught the tubing and pulled it out. IV site assessed and dressed. Catheter intact. New 22G IV placed in left hand.

## 2019-03-20 ENCOUNTER — Observation Stay (HOSPITAL_BASED_OUTPATIENT_CLINIC_OR_DEPARTMENT_OTHER)
Admit: 2019-03-20 | Discharge: 2019-03-20 | Disposition: A | Discharge status: Home / Self Care | Payer: Medicare Other | Attending: Internal Medicine | Admitting: Internal Medicine

## 2019-03-20 ENCOUNTER — Observation Stay: Payer: Medicare Other

## 2019-03-20 DIAGNOSIS — R55 Syncope and collapse: Secondary | ICD-10-CM | POA: Diagnosis not present

## 2019-03-20 DIAGNOSIS — W108XXS Fall (on) (from) other stairs and steps, sequela: Secondary | ICD-10-CM | POA: Diagnosis not present

## 2019-03-20 DIAGNOSIS — N179 Acute kidney failure, unspecified: Principal | ICD-10-CM

## 2019-03-20 DIAGNOSIS — I9589 Other hypotension: Secondary | ICD-10-CM | POA: Diagnosis not present

## 2019-03-20 DIAGNOSIS — E861 Hypovolemia: Secondary | ICD-10-CM

## 2019-03-20 LAB — CBC
HCT: 34.2 % — ABNORMAL LOW (ref 36.0–46.0)
Hemoglobin: 10.8 g/dL — ABNORMAL LOW (ref 12.0–15.0)
MCH: 27.2 pg (ref 26.0–34.0)
MCHC: 31.6 g/dL (ref 30.0–36.0)
MCV: 86.1 fL (ref 80.0–100.0)
Platelets: 273 10*3/uL (ref 150–400)
RBC: 3.97 MIL/uL (ref 3.87–5.11)
RDW: 15 % (ref 11.5–15.5)
WBC: 6.3 10*3/uL (ref 4.0–10.5)
nRBC: 0 % (ref 0.0–0.2)

## 2019-03-20 LAB — BASIC METABOLIC PANEL
Anion gap: 5 (ref 5–15)
BUN: 24 mg/dL — ABNORMAL HIGH (ref 6–20)
CO2: 21 mmol/L — ABNORMAL LOW (ref 22–32)
Calcium: 8.4 mg/dL — ABNORMAL LOW (ref 8.9–10.3)
Chloride: 113 mmol/L — ABNORMAL HIGH (ref 98–111)
Creatinine, Ser: 1.2 mg/dL — ABNORMAL HIGH (ref 0.44–1.00)
GFR calc Af Amer: >60 mL/min (ref >60)
GFR calc non Af Amer: 56 mL/min — ABNORMAL LOW (ref >60)
Glucose, Bld: 100 mg/dL — ABNORMAL HIGH (ref 70–99)
Potassium: 3.8 mmol/L (ref 3.5–5.1)
Sodium: 139 mmol/L (ref 135–145)

## 2019-03-20 LAB — GLUCOSE, CAPILLARY
Glucose-Capillary: 104 mg/dL — ABNORMAL HIGH (ref 70–99)
Glucose-Capillary: 147 mg/dL — ABNORMAL HIGH (ref 70–99)

## 2019-03-20 LAB — TROPONIN I (HIGH SENSITIVITY): Troponin I (High Sensitivity): 12 ng/L (ref <18)

## 2019-03-20 LAB — ECHOCARDIOGRAM COMPLETE
Height: 65 in
Weight: 3114.66 oz

## 2019-03-20 LAB — HEMOGLOBIN A1C
Hgb A1c MFr Bld: 6.3 % — ABNORMAL HIGH (ref 4.8–5.6)
Mean Plasma Glucose: 134.11 mg/dL

## 2019-03-20 MED ORDER — LISINOPRIL 5 MG PO TABS: 20.0000 mg | ORAL_TABLET | Freq: Every day | ORAL | 0 refills | Status: DC

## 2019-03-20 NOTE — Plan of Care (Signed)
  Problem: Education: Goal: Knowledge of General Education information will improve Description: Including pain rating scale, medication(s)/side effects and non-pharmacologic comfort measures Outcome: Progressing   Problem: Pain Managment: Goal: General experience of comfort will improve Outcome: Progressing   

## 2019-03-20 NOTE — Progress Notes (Signed)
IV and tele removed from patient. Discharge instructions given to patient. Verbalized understanding. No acute distress at this time. Father to transport patient home.

## 2019-03-20 NOTE — Discharge Summary (Signed)
[facility] [place] [facility]   PATIENT NAME: Katrina EndowCrystal Kolbe    MR#: [facility] [id number]  DATE OF BIRTH:  06/19/1976  DATE OF ADMISSION:  03/19/2019 ADMITTING PHYSICIAN: Ihor AustinPavan Pyreddy, MD  DATE OF DISCHARGE: 6.25.2020  PRIMARY CARE PHYSICIAN: Dorothey BasemanBronstein, David, MD    ADMISSION DIAGNOSIS:  Syncope and collapse [R55] LOC (loss of consciousness) (HCC) [R40.20] Idiopathic hypotension [I95.0] Multiple contusions [T07.XXXA] AKI (acute kidney injury) (HCC) [N17.9]  DISCHARGE DIAGNOSIS:  Active Problems:   Hypotension   presyncope  SECONDARY DIAGNOSIS:   Past Medical History:  Diagnosis Date  . ADHD (attention deficit hyperactivity disorder)   . Asthma   . Bipolar 1 disorder (HCC)   . Diabetes mellitus   . GERD (gastroesophageal reflux disease)     HOSPITAL COURSE:   43 year old female with bipolar 1 disorder and diabetes who presented after mechanical fall. 1.  Presyncope/orthostatic hypotension: Patient evaluated by cardiology.  Telemetry showed no acute pathology.  Echocardiogram shows no acute pathology.  Carotid ultrasound was essentially unremarkable.  This is due to to dehydration. Troponins are negative with no significant delta.  No indication for ischemic work-up at this time. Ambien has been discontinued due to falls.   2.  Acute kidney injury in the setting of dehydration which is improved with IV fluids and is etiology of problem #1.  3.  Bipolar: Patient will continue outpatient regimen.  4. Tobacco dependence: Patient is encouraged to quit smoking and willing to attempt to quit was assessed. Patient highly motivated.Counseling was provided for 4 minutes.  5/ Heart murmur: Reportt as per ECHO  DISCHARGE CONDITIONS AND DIET:   Stable Regular diet  CONSULTS OBTAINED:  Treatment Team:  Jodelle Redhristopher, Bridgette, MD End, Cristal Deerhristopher, MD  DRUG ALLERGIES:   Allergies  Allergen Reactions  . Amlodipine Nausea And Vomiting  . Hydrocodone  Itching    hives    DISCHARGE MEDICATIONS:   Allergies as of 03/20/2019      Reactions   Amlodipine Nausea And Vomiting   Hydrocodone Itching   hives      Medication List    STOP taking these medications   ibuprofen 800 MG tablet Commonly known as: ADVIL   oxyCODONE-acetaminophen 5-325 MG tablet Commonly known as: PERCOCET/ROXICET   predniSONE 10 MG (48) Tbpk tablet Commonly known as: STERAPRED UNI-PAK 48 TAB   propranolol 20 MG tablet Commonly known as: INDERAL   zolpidem 12.5 MG CR tablet Commonly known as: AMBIEN CR     TAKE these medications   albuterol 108 (90 Base) MCG/ACT inhaler Commonly known as: VENTOLIN HFA Inhale 2 puffs into the lungs every 6 (six) hours as needed. For ashtma   ALPRAZolam 0.5 MG tablet Commonly known as: XANAX TAKE 1 TABLET(0.5 MG) BY MOUTH DAILY AS NEEDED FOR ANXIETY   amphetamine-dextroamphetamine 25 MG 24 hr capsule Commonly known as: Adderall XR Take 1 capsule by mouth daily after lunch. What changed: Another medication with the same name was removed. Continue taking this medication, and follow the directions you see here.   busPIRone 30 MG tablet Commonly known as: BUSPAR TAKE 1 TABLET(30 MG) BY MOUTH TWICE DAILY   Chantix Continuing Month Pak 1 MG tablet Generic drug: varenicline TAKE 1 TABLET BY MOUTH TWICE DAILY( EVERY 12 HOURS) What changed: Another medication with the same name was removed. Continue taking this medication, and follow the directions you see here.   fexofenadine 180 MG tablet Commonly known as: ALLEGRA Take 180 mg by mouth daily.   FLONASE NA  Place into the nose.   FLUoxetine 40 MG capsule Commonly known as: PROZAC TAKE 2 CAPSULES(80 MG) BY MOUTH DAILY   gabapentin 800 MG tablet Commonly known as: NEURONTIN TAKE 1 TABLET(800 MG) BY MOUTH THREE TIMES DAILY   guaiFENesin-codeine 100-10 MG/5ML syrup Take 5 mLs by mouth every 4 (four) hours as needed for cough.   lamoTRIgine 200 MG  tablet Commonly known as: LAMICTAL TAKE 1 TABLET BY MOUTH TWICE DAILY   lisinopril 5 MG tablet Commonly known as: ZESTRIL Take 4 tablets (20 mg total) by mouth daily. What changed: medication strength   metFORMIN 500 MG tablet Commonly known as: GLUCOPHAGE Take 500 mg by mouth 2 (two) times daily with a meal.   OLANZapine 10 MG tablet Commonly known as: ZyPREXA Take 1 tablet (10 mg total) by mouth at bedtime.   omega-3 acid ethyl esters 1 g capsule Commonly known as: LOVAZA Take 1 capsule by mouth daily.   omeprazole 20 MG capsule Commonly known as: PRILOSEC Take 20 mg by mouth daily.   ondansetron 4 MG tablet Commonly known as: ZOFRAN Take by mouth.   potassium chloride 10 MEQ tablet Commonly known as: K-DUR TAKE 1 TABLET(10 MEQ) BY MOUTH EVERY DAY   promethazine 25 MG tablet Commonly known as: PHENERGAN TAKE 1 TABLET BY MOUTH EVERY 4 HOURS AS NEEDED FOR NAUSEA   Symbicort 80-4.5 MCG/ACT inhaler Generic drug: budesonide-formoterol Inhale 2 puffs into the lungs 2 (two) times daily.   tiZANidine 4 MG tablet Commonly known as: ZANAFLEX Take 4 mg by mouth 3 (three) times daily.   Tradjenta 5 MG Tabs tablet Generic drug: linagliptin TAKE 1 TABLET(5 MG) BY MOUTH EVERY DAY         Today   CHIEF COMPLAINT:  Doing ok this am some back pain from fall no chest pain, shortness of breath, feelings of lightheadedness.  No neurological deficits.   VITAL SIGNS:  Blood pressure 107/73, pulse 85, temperature 98.2 F (36.8 C), temperature source Oral, resp. rate 18, height  (1.651 m), weight 88.3 kg, SpO2 100 %.   REVIEW OF SYSTEMS:  Review of Systems  Constitutional: Negative.  Negative for chills, fever and malaise/fatigue.  HENT: Negative.  Negative for ear discharge, ear pain, hearing loss, nosebleeds and sore throat.   Eyes: Negative.  Negative for blurred vision and pain.  Respiratory: Negative.  Negative for cough, hemoptysis, shortness of breath and  wheezing.   Cardiovascular: Negative.  Negative for chest pain, palpitations and leg swelling.  Gastrointestinal: Negative.  Negative for abdominal pain, blood in stool, diarrhea, nausea and vomiting.  Genitourinary: Negative.  Negative for dysuria.  Musculoskeletal: Positive for back pain.  Skin: Negative.   Neurological: Negative for dizziness, tremors, speech change, focal weakness, seizures and headaches.  Endo/Heme/Allergies: Negative.  Does not bruise/bleed easily.  Psychiatric/Behavioral: Negative.  Negative for depression, hallucinations and suicidal ideas.     PHYSICAL EXAMINATION:  GENERAL:  43 y.o.-year-old patient lying in the bed with no acute distress.  NECK:  Supple, no jugular venous distention. No thyroid enlargement, no tenderness.  LUNGS: Normal breath sounds bilaterally, no wheezing, rales,rhonchi  No use of accessory muscles of respiration.  CARDIOVASCULAR: S1, S2 normal. 2/34murmurs,no  rubs, or gallops.  ABDOMEN: Soft, non-tender, non-distended. Bowel sounds present. No organomegaly or mass.  EXTREMITIES: No pedal edema, cyanosis, or clubbing.  PSYCHIATRIC: The patient is alert and oriented x 3.  SKIN: No obvious rash, lesion, or ulcer.   DATA REVIEW:   CBC Recent Labs  Lab 03/20/19 0346  WBC 6.3  HGB 10.8*  HCT 34.2*  PLT 273    Chemistries  Recent Labs  Lab 03/19/19 1525 03/20/19 0346  NA 139 139  K 4.4 3.8  CL 106 113*  CO2 20* 21*  GLUCOSE 128* 100*  BUN 35* 24*  CREATININE 2.97* 1.20*  CALCIUM 9.6 8.4*  AST 21  --   ALT 33  --   ALKPHOS 84  --   BILITOT 0.8  --     Cardiac Enzymes No results for input(s): TROPONINI in the last 168 hours.  Microbiology Results  @[email or web address] @  RADIOLOGY:  Ct Head Wo Contrast  Result Date: 03/19/2019 CLINICAL DATA:  Trauma, fall, headache, neck pain EXAM: CT HEAD WITHOUT CONTRAST CT CERVICAL SPINE WITHOUT CONTRAST TECHNIQUE: Multidetector CT imaging of the head and cervical spine was performed  following the standard protocol without intravenous contrast. Multiplanar CT image reconstructions of the cervical spine were also generated. COMPARISON:  None. FINDINGS: CT HEAD FINDINGS Brain: No evidence of acute infarction, hemorrhage, hydrocephalus, extra-axial collection or mass lesion/mass effect. Vascular: No hyperdense vessel or unexpected calcification. Skull: Normal. Negative for fracture or focal lesion. Sinuses/Orbits: No acute finding. Other: None. CT CERVICAL SPINE FINDINGS Alignment: Normal. Skull base and vertebrae: No acute fracture. No primary bone lesion or focal pathologic process. Soft tissues and spinal canal: No prevertebral fluid or swelling. No visible canal hematoma. Disc levels: Status post anterior cervical discectomy and fusion of C5-6 with bony incorporation of the disc space. Upper chest: Negative. Other: None. IMPRESSION: 1.  No acute intracranial pathology. 2. No fracture or static subluxation of the cervical spine. Status post anterior cervical discectomy and fusion of C5-6 with bony incorporation of the disc space. Electronically Signed   By: Eddie Candle M.D.   On: 03/19/2019 16:16   Ct Cervical Spine Wo Contrast  Result Date: 03/19/2019 CLINICAL DATA:  Trauma, fall, headache, neck pain EXAM: CT HEAD WITHOUT CONTRAST CT CERVICAL SPINE WITHOUT CONTRAST TECHNIQUE: Multidetector CT imaging of the head and cervical spine was performed following the standard protocol without intravenous contrast. Multiplanar CT image reconstructions of the cervical spine were also generated. COMPARISON:  None. FINDINGS: CT HEAD FINDINGS Brain: No evidence of acute infarction, hemorrhage, hydrocephalus, extra-axial collection or mass lesion/mass effect. Vascular: No hyperdense vessel or unexpected calcification. Skull: Normal. Negative for fracture or focal lesion. Sinuses/Orbits: No acute finding. Other: None. CT CERVICAL SPINE FINDINGS Alignment: Normal. Skull base and vertebrae: No acute  fracture. No primary bone lesion or focal pathologic process. Soft tissues and spinal canal: No prevertebral fluid or swelling. No visible canal hematoma. Disc levels: Status post anterior cervical discectomy and fusion of C5-6 with bony incorporation of the disc space. Upper chest: Negative. Other: None. IMPRESSION: 1.  No acute intracranial pathology. 2. No fracture or static subluxation of the cervical spine. Status post anterior cervical discectomy and fusion of C5-6 with bony incorporation of the disc space. Electronically Signed   By: Eddie Candle M.D.   On: 03/19/2019 16:16   Ct Thoracic Spine Wo Contrast  Result Date: 03/19/2019 CLINICAL DATA:  Initial evaluation for acute trauma, fall, back pain. EXAM: CT THORACIC AND LUMBAR SPINE WITHOUT CONTRAST TECHNIQUE: Multidetector CT imaging of the thoracic and lumbar spine was performed without contrast. Multiplanar CT image reconstructions were also generated. COMPARISON:  None available. FINDINGS: CT THORACIC SPINE FINDINGS Alignment: Vertebral bodies normally aligned with preservation of the normal thoracic kyphosis. No listhesis or malalignment. Vertebrae: Vertebral body heights maintained without  evidence for acute or chronic fracture. No discrete lytic or blastic osseous lesions. Paraspinal and other soft tissues: Paraspinous soft tissues demonstrate no acute finding. Visualized visceral structures within normal limits. Partially visualized lungs are grossly clear. Mild atherosclerotic change noted within the visualized aorta. Disc levels: ACDF at C5-6 noted, partially visualized. No significant disc pathology within the thoracic spine. No canal or foraminal stenosis. CT LUMBAR SPINE FINDINGS Segmentation: Standard. Lowest well-formed disc labeled the L5-S1 level. Alignment: Vertebral bodies normally aligned with preservation of the normal lumbar lordosis. No listhesis or malalignment. Vertebrae: Vertebral body heights maintained without evidence for  acute or chronic fracture. Visualized sacrum and pelvis intact. SI joints approximated symmetric. No discrete osseous lesions. Paraspinal and other soft tissues: Paraspinous soft tissues demonstrate no acute finding. Chronic postoperative changes noted within the lower posterior paraspinous soft tissues. Mild aorto bi-iliac atherosclerotic disease. Visualized visceral structures within normal limits. Disc levels: Prior posterior and interbody fusion with posterior decompression at L5-S1. No hardware complication. No other significant disc pathology within the lumbar spine. No canal or foraminal stenosis. IMPRESSION: CT THORACIC SPINE IMPRESSION No acute traumatic injury within the thoracic spine. CT LUMBAR SPINE IMPRESSION 1. No acute traumatic injury within the lumbar spine. 2. Prior posterior and interbody fusion at L5-S1. No hardware complication. Electronically Signed   By: Rise Mu M.D.   On: 03/19/2019 17:00   Ct Lumbar Spine Wo Contrast  Result Date: 03/19/2019 CLINICAL DATA:  Initial evaluation for acute trauma, fall, back pain. EXAM: CT THORACIC AND LUMBAR SPINE WITHOUT CONTRAST TECHNIQUE: Multidetector CT imaging of the thoracic and lumbar spine was performed without contrast. Multiplanar CT image reconstructions were also generated. COMPARISON:  None available. FINDINGS: CT THORACIC SPINE FINDINGS Alignment: Vertebral bodies normally aligned with preservation of the normal thoracic kyphosis. No listhesis or malalignment. Vertebrae: Vertebral body heights maintained without evidence for acute or chronic fracture. No discrete lytic or blastic osseous lesions. Paraspinal and other soft tissues: Paraspinous soft tissues demonstrate no acute finding. Visualized visceral structures within normal limits. Partially visualized lungs are grossly clear. Mild atherosclerotic change noted within the visualized aorta. Disc levels: ACDF at C5-6 noted, partially visualized. No significant disc pathology  within the thoracic spine. No canal or foraminal stenosis. CT LUMBAR SPINE FINDINGS Segmentation: Standard. Lowest well-formed disc labeled the L5-S1 level. Alignment: Vertebral bodies normally aligned with preservation of the normal lumbar lordosis. No listhesis or malalignment. Vertebrae: Vertebral body heights maintained without evidence for acute or chronic fracture. Visualized sacrum and pelvis intact. SI joints approximated symmetric. No discrete osseous lesions. Paraspinal and other soft tissues: Paraspinous soft tissues demonstrate no acute finding. Chronic postoperative changes noted within the lower posterior paraspinous soft tissues. Mild aorto bi-iliac atherosclerotic disease. Visualized visceral structures within normal limits. Disc levels: Prior posterior and interbody fusion with posterior decompression at L5-S1. No hardware complication. No other significant disc pathology within the lumbar spine. No canal or foraminal stenosis. IMPRESSION: CT THORACIC SPINE IMPRESSION No acute traumatic injury within the thoracic spine. CT LUMBAR SPINE IMPRESSION 1. No acute traumatic injury within the lumbar spine. 2. Prior posterior and interbody fusion at L5-S1. No hardware complication. Electronically Signed   By: Rise Mu M.D.   On: 03/19/2019 17:00   US Carotid Bilateral  Result Date: 03/20/2019 CLINICAL DATA:  43 year old female with syncope and collapse. EXAM: BILATERAL CAROTID DUPLEX ULTRASOUND TECHNIQUE: Wallace Cullens scale imaging, color Doppler and duplex ultrasound were performed of bilateral carotid and vertebral arteries in the neck. COMPARISON:  None. FINDINGS: Criteria:  Quantification of carotid stenosis is based on velocity parameters that correlate the residual internal carotid diameter with NASCET-based stenosis levels, using the diameter of the distal internal carotid lumen as the denominator for stenosis measurement. The following velocity measurements were obtained: RIGHT ICA: 87/38  cm/sec CCA: 103/24 cm/sec SYSTOLIC ICA/CCA RATIO:  0.8 ECA:  69 cm/sec LEFT ICA: 92/43 cm/sec CCA: 88/23 cm/sec SYSTOLIC ICA/CCA RATIO:  1.0 ECA:  54 cm/sec RIGHT CAROTID ARTERY: No significant atherosclerotic plaque or evidence of stenosis. RIGHT VERTEBRAL ARTERY:  Patent with normal antegrade flow. LEFT CAROTID ARTERY: No significant atherosclerotic plaque or stenosis. LEFT VERTEBRAL ARTERY:  Patent with normal antegrade flow. IMPRESSION: Normal bilateral carotid duplex ultrasound. Signed, Sterling BigHeath K. McCullough, MD, RPVI [facility] Electronically Signed   By: Malachy MoanHeath  McCullough M.D.   On: 03/20/2019 08:48      Allergies as of 03/20/2019      Reactions   Amlodipine Nausea And Vomiting   Hydrocodone Itching   hives      Medication List    STOP taking these medications   ibuprofen 800 MG tablet Commonly known as: ADVIL   oxyCODONE-acetaminophen 5-325 MG tablet Commonly known as: PERCOCET/ROXICET   predniSONE 10 MG (48) Tbpk tablet Commonly known as: STERAPRED UNI-PAK 48 TAB   propranolol 20 MG tablet Commonly known as: INDERAL   zolpidem 12.5 MG CR tablet Commonly known as: AMBIEN CR     TAKE these medications   albuterol 108 (90 Base) MCG/ACT inhaler Commonly known as: VENTOLIN HFA Inhale 2 puffs into the lungs every 6 (six) hours as needed. For ashtma   ALPRAZolam 0.5 MG tablet Commonly known as: XANAX TAKE 1 TABLET(0.5 MG) BY MOUTH DAILY AS NEEDED FOR ANXIETY   amphetamine-dextroamphetamine 25 MG 24 hr capsule Commonly known as: Adderall XR Take 1 capsule by mouth daily after lunch. What changed: Another medication with the same name was removed. Continue taking this medication, and follow the directions you see here.   busPIRone 30 MG tablet Commonly known as: BUSPAR TAKE 1 TABLET(30 MG) BY MOUTH TWICE DAILY   Chantix Continuing Month Pak 1 MG tablet Generic drug: varenicline TAKE 1 TABLET BY MOUTH TWICE  DAILY( EVERY 12 HOURS) What changed: Another medication with the same name was removed. Continue taking this medication, and follow the directions you see here.   fexofenadine 180 MG tablet Commonly known as: ALLEGRA Take 180 mg by mouth daily.   FLONASE NA Place into the nose.   FLUoxetine 40 MG capsule Commonly known as: PROZAC TAKE 2 CAPSULES(80 MG) BY MOUTH DAILY   gabapentin 800 MG tablet Commonly known as: NEURONTIN TAKE 1 TABLET(800 MG) BY MOUTH THREE TIMES DAILY   guaiFENesin-codeine 100-10 MG/5ML syrup Take 5 mLs by mouth every 4 (four) hours as needed for cough.   lamoTRIgine 200 MG tablet Commonly known as: LAMICTAL TAKE 1 TABLET BY MOUTH TWICE DAILY   lisinopril 5 MG tablet Commonly known as: ZESTRIL Take 4 tablets (20 mg total) by mouth daily. What changed: medication strength   metFORMIN 500 MG tablet Commonly known as: GLUCOPHAGE Take 500 mg by mouth 2 (two) times daily with a meal.   OLANZapine 10 MG tablet Commonly known as: ZyPREXA Take 1 tablet (10 mg total) by mouth at bedtime.   omega-3 acid ethyl esters 1 g capsule Commonly known as: LOVAZA Take 1 capsule by mouth daily.   omeprazole 20 MG capsule Commonly known as: PRILOSEC Take 20 mg by mouth daily.  ondansetron 4 MG tablet Commonly known as: ZOFRAN Take by mouth.   potassium chloride 10 MEQ tablet Commonly known as: K-DUR TAKE 1 TABLET(10 MEQ) BY MOUTH EVERY DAY   promethazine 25 MG tablet Commonly known as: PHENERGAN TAKE 1 TABLET BY MOUTH EVERY 4 HOURS AS NEEDED FOR NAUSEA   Symbicort 80-4.5 MCG/ACT inhaler Generic drug: budesonide-formoterol Inhale 2 puffs into the lungs 2 (two) times daily.   tiZANidine 4 MG tablet Commonly known as: ZANAFLEX Take 4 mg by mouth 3 (three) times daily.   Tradjenta 5 MG Tabs tablet Generic drug: linagliptin TAKE 1 TABLET(5 MG) BY MOUTH EVERY DAY         Management plans discussed with the patient and she is in agreement. Stable for  discharge home  Patient should follow up with pcp  CODE STATUS:     Code Status Orders  (From admission, onward)         Start     Ordered   03/19/19 1757  Full code  Continuous     03/19/19 1757        Code Status History    This patient has a current code status but no historical code status.   Advance Care Planning Activity      TOTAL TIME TAKING CARE OF THIS PATIENT: 38 minutes.    Note: This dictation was prepared with Dragon dictation along with smaller phrase technology. Any transcriptional errors that result from this process are unintentional.  Adrian SaranSital Mylo Choi M.D on 03/20/2019 at 11:02 AM  Between 7am to 6pm - Pager - [phone] After 6pm go to [email or web address] - Social research officer, governmentpassword [email or web address]  [facility]  Office  [phone]  CC: Primary care physician; Dorothey BasemanBronstein, David, MD

## 2019-03-20 NOTE — Progress Notes (Signed)
*  PRELIMINARY RESULTS* Echocardiogram 2D Echocardiogram has been performed.  Brainard Highfill 03/20/2019, 10:36 AM 

## 2019-03-20 NOTE — Consult Note (Addendum)
Cardiology Consultation:   Patient ID: Katrina Booker; [id number]; Jan 25, 1976   Admit date: 03/19/2019 Date of Consult: 03/20/2019  Primary Care Provider: Dorothey BasemanBronstein, David, MD Primary Cardiologist: [facility] - consult by Cristal Deerhristopher   Patient Profile:   Katrina Booker is a 43 y.o. female with a hx of anxiety, depression, bipolar, ADHD, and GERD who is being seen today for the evaluation of fall at the request of Dr. Tobi BastosPyreddy.  History of Present Illness:   Ms. Katrina Booker has no previously known cardiac history.  Patient woke up at approximately 2:30 AM on 6/24 needing to use the restroom.  She sleeps on the upper floor of the house with the restroom on the lower floor.  There are 15 steps for her to go down.  She was walking down the stairs and misstepped, causing her to fall down the remaining steps (she estimates 11 steps) and landed on the floor with a closed door in front of her.  She remembers missing the step and the fall.  She attempted to get up to open the door though could not.  There was no associated chest pain, shortness of breath, palpitations, dizziness, or presyncope.  The next thing she remembers is hearing her parents dogs bark.  [facility] they were found to be hypotensive with systolic blood pressures in the 60s to 80s millimeters mercury.  Following administration of IV fluid the patient remained orthostatic with drop in systolic blood pressure of 20 mmHg.  The patient was noted to be in acute renal failure with a serum creatinine of 3.97 with a baseline of 0.7.  High-sensitivity troponin has been without significant delta change x4. EKG without acute changes as below, CXR not performed.  CT head nonacute, CT C-spine without fracture or subluxation of the C-spine previously noted anterior cervical discectomy and fusion of C5-6.  CT thoracic and lumbar spine without acute traumatic injury.  Labs showed hs-Tn 19-->16 x 2,-->12, SCr 2.97 with a  baseline of 0.78, K+ 4.4.    Cardiology has been consulted in the setting of fall.  Currently, without any active cardiac complaints with noted back pain.    She has no explanation regarding her significant hypotension and AKI noted upon arrival.  She denies any recent illnesses, nausea, vomiting, or diarrhea.  Indicates she has been eating and drinking well.  Past Medical History:  Diagnosis Date   ADHD (attention deficit hyperactivity disorder)    Asthma    Bipolar 1 disorder (HCC)    Diabetes mellitus    GERD (gastroesophageal reflux disease)     Past Surgical History:  Procedure Laterality Date   ABDOMINAL HYSTERECTOMY     ABDOMINAL SURGERY     BACK SURGERY     HERNIA REPAIR     INCONTINENCE SURGERY     OTHER SURGICAL HISTORY     tumor excision on face   TUBAL LIGATION       Home Meds: Prior to Admission medications   Medication Sig Start Date End Date Taking? Authorizing Provider  ALPRAZolam (XANAX) 0.5 MG tablet TAKE 1 TABLET(0.5 MG) BY MOUTH DAILY AS NEEDED FOR ANXIETY 03/17/19  Yes Hurst, [name], PA-C  amphetamine-dextroamphetamine (ADDERALL XR) 25 MG 24 hr capsule Take 1 capsule by mouth daily after lunch. 03/14/19  Yes Chauncey MannJennings, Glenn E, MD  amphetamine-dextroamphetamine (ADDERALL XR) 30 MG 24 hr capsule Take 1 capsule (30 mg total) by mouth daily after breakfast. 03/14/19  Yes Beverly MilchJennings, Glenn  E, MD  budesonide-formoterol (SYMBICORT) 80-4.5 MCG/ACT inhaler Inhale 2 puffs into the lungs 2 (two) times daily.   Yes [provider]  busPIRone (BUSPAR) 30 MG tablet TAKE 1 TABLET(30 MG) BY MOUTH TWICE DAILY 02/03/19  Yes Hurst, [name], PA-C  FLUoxetine (PROZAC) 40 MG capsule TAKE 2 CAPSULES(80 MG) BY MOUTH DAILY 02/03/19  Yes Hurst, [name], PA-C  gabapentin (NEURONTIN) 800 MG tablet TAKE 1 TABLET(800 MG) BY MOUTH THREE TIMES DAILY 06/07/18  Yes [provider]  lamoTRIgine (LAMICTAL) 200 MG tablet TAKE 1 TABLET BY MOUTH TWICE DAILY 02/17/19  Yes Hurst, Teresa  T, PA-C  linagliptin (TRADJENTA) 5 MG TABS tablet TAKE 1 TABLET(5 MG) BY MOUTH EVERY DAY 07/23/18  Yes [provider]  lisinopril (PRINIVIL,ZESTRIL) 20 MG tablet Take 20 mg by mouth daily.   Yes [provider]  metFORMIN (GLUCOPHAGE) 500 MG tablet Take 500 mg by mouth 2 (two) times daily with a meal.   Yes [provider]  OLANZapine (ZYPREXA) 10 MG tablet Take 1 tablet (10 mg total) by mouth at bedtime. 07/22/18  Yes Hurst, Rosey Bath T, PA-C  omega-3 acid ethyl esters (LOVAZA) 1 g capsule Take 1 capsule by mouth daily. 06/17/18  Yes [provider]  omeprazole (PRILOSEC) 20 MG capsule Take 20 mg by mouth daily.   Yes [provider]  potassium chloride (K-DUR) 10 MEQ tablet TAKE 1 TABLET(10 MEQ) BY MOUTH EVERY DAY 06/07/18  Yes [provider]  propranolol (INDERAL) 20 MG tablet 1 po q am for 4 days, then may increase to bid. 01/20/19  Yes Hurst, Rosey Bath T, PA-C  tiZANidine (ZANAFLEX) 4 MG tablet Take 4 mg by mouth 3 (three) times daily. 03/08/19 03/07/20 Yes [provider]  zolpidem (AMBIEN CR) 12.5 MG CR tablet TAKE 1 TABLET(12.5 MG) BY MOUTH AT BEDTIME AS NEEDED FOR SLEEP 03/17/19  Yes Hurst, [name], PA-C  albuterol (PROVENTIL HFA;VENTOLIN HFA) 108 (90 BASE) MCG/ACT inhaler Inhale 2 puffs into the lungs every 6 (six) hours as needed. For ashtma    [provider]  amphetamine-dextroamphetamine (ADDERALL XR) 30 MG 24 hr capsule Take 1 capsule (30 mg total) by mouth daily. Patient not taking: Reported on 03/19/2019 02/13/19   Melony Overly T, PA-C  CHANTIX CONTINUING MONTH PAK 1 MG tablet TAKE 1 TABLET BY MOUTH TWICE DAILY( EVERY 12 HOURS) 02/04/19   Hurst, Glade Nurse, PA-C  fexofenadine (ALLEGRA) 180 MG tablet Take 180 mg by mouth daily.  06/17/18   [provider]  Fluticasone Propionate (FLONASE NA) Place into the nose.    [provider]  guaiFENesin-codeine 100-10 MG/5ML syrup Take 5 mLs by mouth every 4 (four)  hours as needed for cough. Patient not taking: Reported on 10/03/2018 10/27/17   [name], PA-C  ibuprofen (ADVIL,MOTRIN) 800 MG tablet Take 1 tablet (800 mg total) by mouth every 8 (eight) hours as needed for moderate pain. Patient not taking: Reported on 03/19/2019 09/22/18   [name], MD  ondansetron [facility]) 4 MG tablet Take by mouth. 06/17/18   [provider]  oxyCODONE-acetaminophen (PERCOCET/ROXICET) 5-325 MG tablet Take 1 tablet by mouth every 4 (four) hours as needed for severe pain. Patient not taking: Reported on 10/03/2018 09/22/18   [name], MD  predniSONE (STERAPRED UNI-PAK 48 TAB) 10 MG (48) TBPK tablet Take 6 pills for 2 days then decrease by 1 pill every 2 days Patient not taking: Reported on 10/03/2018 10/27/17   [name], PA-C  promethazine (PHENERGAN) 25 MG tablet TAKE 1 TABLET BY MOUTH EVERY 4 HOURS AS NEEDED FOR NAUSEA 06/07/18   [provider]  varenicline (CHANTIX STARTING MONTH PAK) 0.5 MG X 11 & 1 MG X 42 tablet Take one 0.5 mg tablet by mouth once daily for 3 days, then increase to one 0.5 mg tablet twice daily for 4 days, then increase to one 1 mg tablet twice daily. Patient not taking: Reported on 02/18/2019 12/09/18   [name], [name], PA-C    Inpatient Medications: Scheduled Meds:  amphetamine-dextroamphetamine  25 mg Oral Q1200   amphetamine-dextroamphetamine  30 mg Oral QPC breakfast   FLUoxetine  40 mg Oral Daily   heparin  5,000 Units Subcutaneous Q8H   insulin aspart  0-15 Units Subcutaneous TID WC   insulin aspart  0-5 Units Subcutaneous QHS   lamoTRIgine  200 mg Oral BID   linagliptin  5 mg Oral Daily   mometasone-formoterol  2 puff Inhalation BID   nicotine  21 mg Transdermal Daily   OLANZapine  10 mg Oral QHS   pantoprazole  40 mg Oral Daily   Continuous Infusions:  sodium chloride 150 mL/hr at 03/20/19 0357   PRN Meds: acetaminophen **OR** acetaminophen, albuterol, ALPRAZolam, ondansetron **OR** ondansetron (ZOFRAN)  IV, senna-docusate, traMADol  Allergies:   Allergies  Allergen Reactions   Amlodipine Nausea And Vomiting   Hydrocodone Itching    hives    Social History:   Social History   Socioeconomic History   Marital status: Married    Spouse name: Not on file   Number of children: Not on file   Years of education: Not on file   Highest education level: Not on file  Occupational History   Not on file  Social Needs   Financial resource strain: Not on file   Food insecurity    Worry: Not on file    Inability: Not on file   Transportation needs    Medical: Not on file    Non-medical: Not on file  Tobacco Use   Smoking status: Current Every Day Smoker    Packs/day: 1.00    Types: Cigarettes   Smokeless tobacco: Never Used  Substance and Sexual Activity   Alcohol use: No    Alcohol/week: 0.0 standard drinks   Drug use: No   Sexual activity: Not on file  Lifestyle   Physical activity    Days per week: Not on file    Minutes per session: Not on file   Stress: Not on file  Relationships   Social connections    Talks on phone: Not on file    Gets together: Not on file    Attends religious service: Not on file    Active member of club or organization: Not on file    Attends meetings of clubs or organizations: Not on file    Relationship status: Not on file   Intimate partner violence    Fear of current or ex partner: Not on file    Emotionally abused: Not on file    Physically abused: Not on file    Forced sexual activity: Not on file  Other Topics Concern   Not on file  Social History Narrative   Not on file     Family History:   Family History  Problem Relation Age of Onset   Hypothyroidism Mother    Diabetes Father    Heart disease Father     ROS:  Review of Systems  Constitutional: Positive  for malaise/fatigue. Negative for chills, diaphoresis, fever and weight loss.  HENT: Negative for congestion.   Eyes: Negative for discharge and redness.  Respiratory:  Negative for cough, hemoptysis, sputum production, shortness of breath and wheezing.   Cardiovascular: Negative for chest pain, palpitations, orthopnea, claudication, leg swelling and PND.  Gastrointestinal: Negative for abdominal pain, blood in stool, heartburn, melena, nausea and vomiting.  Genitourinary: Negative for hematuria.  Musculoskeletal: Positive for back pain, falls, joint pain, myalgias and neck pain.  Skin: Negative for rash.  Neurological: Positive for weakness. Negative for dizziness, tingling, tremors, sensory change, speech change, focal weakness and loss of consciousness.  Endo/Heme/Allergies: Does not bruise/bleed easily.  Psychiatric/Behavioral: Negative for substance abuse. The patient is not nervous/anxious.   All other systems reviewed and are negative.     Physical Exam/Data:   Vitals:   03/19/19 2114 03/19/19 2121 03/20/19 0433 03/20/19 0752  BP: (!) 87/59 (!) 95/48 112/63 107/73  Pulse: 92 82 72 85  Resp:   20 18  Temp:   99.3 F (37.4 C) 98.2 F (36.8 C)  TempSrc:   Oral Oral  SpO2: 100% 100% 100% 100%  Weight:   88.3 kg   Height:        Intake/Output Summary (Last 24 hours) at 03/20/2019 0820 Last data filed at 03/20/2019 0756 Gross per 24 hour  Intake 1783.78 ml  Output 2000 ml  Net -216.22 ml   Filed Weights   03/19/19 1434 03/20/19 0433  Weight: 90.7 kg 88.3 kg   Body mass index is 32.39 kg/m.   Physical Exam: General: Well developed, well nourished, in no acute distress. Head: Normocephalic, atraumatic, sclera non-icteric, no xanthomas, nares without discharge.  Neck: Negative for carotid bruits. JVD not elevated. Lungs: Clear bilaterally to auscultation without wheezes, rales, or rhonchi. Breathing is unlabored. Heart: RRR with S1 S2. No murmurs, rubs, or gallops appreciated. Abdomen: Soft, non-tender, non-distended with normoactive bowel sounds. No hepatomegaly. No rebound/guarding. No obvious abdominal masses. Msk:  Strength and tone  appear normal for age. Extremities: No clubbing or cyanosis. No edema. Distal pedal pulses are 2+ and equal bilaterally.  Slight bruising along the right upper extremity. Neuro: Alert and oriented X 3. No facial asymmetry. No focal deficit. Moves all extremities spontaneously. Psych:  Responds to questions appropriately with a normal affect.   EKG:  The EKG was personally reviewed and demonstrates: NSR, 80 bpm, RSR', no acute st/t changes (unchanged from prior) Telemetry:  Telemetry was personally reviewed and demonstrates: SR  Weights: [employer] Weights   03/19/19 1434 03/20/19 0433  Weight: 90.7 kg 88.3 kg    Relevant CV Studies: None on file  Laboratory Data:  Chemistry Recent Labs  Lab 03/19/19 1525 03/20/19 0346  NA 139 139  K 4.4 3.8  CL 106 113*  CO2 20* 21*  GLUCOSE 128* 100*  BUN 35* 24*  CREATININE 2.97* 1.20*  CALCIUM 9.6 8.4*  GFRNONAA 19* 56*  GFRAA 22* >60  ANIONGAP 13 5    Recent Labs  Lab 03/19/19 1525  PROT 7.3  ALBUMIN 4.0  AST 21  ALT 33  ALKPHOS 84  BILITOT 0.8   Hematology Recent Labs  Lab 03/19/19 1525 03/20/19 0346  WBC 11.9* 6.3  RBC 4.42 3.97  HGB 12.0 10.8*  HCT 36.8 34.2*  MCV 83.3 86.1  MCH 27.1 27.2  MCHC 32.6 31.6  RDW 15.0 15.0  PLT 350 273   Cardiac EnzymesNo results for input(s): TROPONINI in the last 168 hours. No results  for input(s): TROPIPOC in the last 168 hours.  BNPNo results for input(s): BNP, PROBNP in the last 168 hours.  DDimer No results for input(s): DDIMER in the last 168 hours.  Radiology/Studies:  Ct Head Wo Contrast  Result Date: 03/19/2019 IMPRESSION: 1.  No acute intracranial pathology. 2. No fracture or static subluxation of the cervical spine. Status post anterior cervical discectomy and fusion of C5-6 with bony incorporation of the disc space. Electronically Signed   By: Eddie Candle M.D.   On: 03/19/2019 16:16   Ct Cervical Spine Wo Contrast  Result Date: 03/19/2019 IMPRESSION: 1.  No acute  intracranial pathology. 2. No fracture or static subluxation of the cervical spine. Status post anterior cervical discectomy and fusion of C5-6 with bony incorporation of the disc space. Electronically Signed   By: Eddie Candle M.D.   On: 03/19/2019 16:16   Ct Thoracic Spine Wo Contrast  Result Date: 03/19/2019 IMPRESSION: CT THORACIC SPINE IMPRESSION No acute traumatic injury within the thoracic spine. CT LUMBAR SPINE IMPRESSION 1. No acute traumatic injury within the lumbar spine. 2. Prior posterior and interbody fusion at L5-S1. No hardware complication. Electronically Signed   By: Jeannine Boga M.D.   On: 03/19/2019 17:00   Ct Lumbar Spine Wo Contrast  Result Date: 03/19/2019 IMPRESSION: CT THORACIC SPINE IMPRESSION No acute traumatic injury within the thoracic spine. CT LUMBAR SPINE IMPRESSION 1. No acute traumatic injury within the lumbar spine. 2. Prior posterior and interbody fusion at L5-S1. No hardware complication. Electronically Signed   By: Jeannine Boga M.D.   On: 03/19/2019 17:00    Assessment and Plan:   1. Fall/orthostatic hypotension: -Patient indicates that she got up at 2:30 in the morning on 6/24 and was walking down a set of 15 stairs when she missed 1 stair causing her to fall down the final 4 stairs.  She remembers missing the stair and the fall.  Upon reaching the floor she could not get up to open the door at the bottom of the steps.  There next thing she remembers is hearing her parents dogs bark at around 8:30 AM on 6/24.  There was no associated chest pain, shortness of breath, palpitations, dizziness, or presyncope -Tele has been unrevealing -Echo pending -Carotid artery ultrasound without significant stenosis  -Orthostatic vitals positive, recommend trending later today following IV hydration, deferred to IM -Hs-Tn negative without significant delta, no indication for ischemic workup or outpatient cardiology follow up  2. ARF: -Admission SCr 2.97  with a baseline of 0.78, improved to 1.20 this morning with IV fluids -Denies any recent nausea, vomiting, diarrhea and reports adequate p.o. intake -Per IM  3.  Murmur: -Await echo     For questions or updates, please contact [place] Please consult [email or web address] for contact info under Cardiology/STEMI.   Signed, Christell Faith, PA-C [place] Pager: [phone] 03/20/2019, 8:20 AM

## 2019-03-21 ENCOUNTER — Other Ambulatory Visit: Payer: Self-pay | Admitting: Physician Assistant

## 2019-03-21 LAB — HIV ANTIBODY (ROUTINE TESTING W REFLEX): HIV Screen 4th Generation wRfx: NONREACTIVE

## 2019-03-21 LAB — NOVEL CORONAVIRUS, NAA (HOSP ORDER, SEND-OUT TO REF LAB; TAT 18-24 HRS): SARS-CoV-2, NAA: NOT DETECTED

## 2019-04-03 ENCOUNTER — Ambulatory Visit (INDEPENDENT_AMBULATORY_CARE_PROVIDER_SITE_OTHER): Payer: BC Managed Care – PPO | Admitting: Physician Assistant

## 2019-04-03 ENCOUNTER — Other Ambulatory Visit: Payer: Self-pay

## 2019-04-03 ENCOUNTER — Encounter: Payer: Self-pay | Admitting: Physician Assistant

## 2019-04-03 DIAGNOSIS — F411 Generalized anxiety disorder: Secondary | ICD-10-CM | POA: Diagnosis not present

## 2019-04-03 DIAGNOSIS — F331 Major depressive disorder, recurrent, moderate: Secondary | ICD-10-CM | POA: Diagnosis not present

## 2019-04-03 DIAGNOSIS — F900 Attention-deficit hyperactivity disorder, predominantly inattentive type: Secondary | ICD-10-CM | POA: Diagnosis not present

## 2019-04-03 DIAGNOSIS — Z716 Tobacco abuse counseling: Secondary | ICD-10-CM | POA: Diagnosis not present

## 2019-04-03 MED ORDER — AMPHETAMINE-DEXTROAMPHET ER 30 MG PO CP24: 30.0000 mg | ORAL_CAPSULE | Freq: Every day | ORAL | 0 refills | Status: DC

## 2019-04-03 MED ORDER — AMPHETAMINE-DEXTROAMPHET ER 25 MG PO CP24: 25.0000 mg | ORAL_CAPSULE | Freq: Every day | ORAL | 0 refills | Status: DC

## 2019-04-03 MED ORDER — BUSPIRONE HCL 30 MG PO TABS: 30.0000 mg | ORAL_TABLET | Freq: Two times a day (BID) | ORAL | 2 refills | Status: DC

## 2019-04-03 MED ORDER — FLUOXETINE HCL 40 MG PO CAPS: ORAL_CAPSULE | ORAL | 2 refills | Status: DC

## 2019-04-03 MED ORDER — CLONAZEPAM 0.5 MG PO TABS: 0.5000 mg | ORAL_TABLET | Freq: Every day | ORAL | 2 refills | Status: DC | PRN

## 2019-04-03 MED ORDER — VARENICLINE TARTRATE 1 MG PO TABS: ORAL_TABLET | ORAL | 2 refills | Status: DC

## 2019-04-03 NOTE — Progress Notes (Signed)
[facility]  Patient ID: Katrina Booker,  MRN: [id number]  PCP: Juluis Pitch, MD  Date of Evaluation: 04/03/2019 Time spent:25 minutes  Chief Complaint:  Chief Complaint    Anxiety     Virtual Visit via Telephone Note  I connected with patient by a video enabled telemedicine application or telephone, with their informed consent, and verified patient privacy and that I am speaking with the correct person using two identifiers.  I am private, in my office and the patient is home.  I discussed the limitations, risks, security and privacy concerns of performing an evaluation and management service by telephone and the availability of in person appointments. I also discussed with the patient that there may be a patient responsible charge related to this service. The patient expressed understanding and agreed to proceed.   I discussed the assessment and treatment plan with the patient. The patient was provided an opportunity to ask questions and all were answered. The patient agreed with the plan and demonstrated an understanding of the instructions.   The patient was advised to call back or seek an in-person evaluation if the symptoms worsen or if the condition fails to improve as anticipated.  I provided 25 minutes of non-face-to-face time during this encounter.  HISTORY/CURRENT STATUS: HPI for routine med check.  Patient continues to have a lot of anxiety.  She gets panicky every day for 1 reason or another.  She is still taking care of an elderly parent, running 2 households, and taking care of her 60-month-old grandchild.  She had been trying to go to school but could not tackle all of those issues.  The Xanax has been helpful but it does not last long enough.  She does not feel that the Adderall causes more anxiety.  She feels like if she does not use it and is not able to finish things around the house or be motivated enough to take care of her parents and her  grandchild then she gets more anxious. If she does not have the Adderall, she cannot focus in order to keep up with all of these responsibilities.   She is still on Chantix and is not craving cigarettes as much.  She has decreased from 1 pack to about three fourths of a pack over the course of a month or so.  Patient denies loss of interest in usual activities and is able to enjoy things.  Denies decreased energy or motivation.  Appetite has not changed.  No extreme sadness, tearfulness, or feelings of hopelessness.  Denies any changes in concentration, making decisions or remembering things.  Denies suicidal or homicidal thoughts.  Denies dizziness, syncope, seizures, numbness, tingling, tremor, tics, unsteady gait, slurred speech, confusion. Denies muscle or joint pain, stiffness, or dystonia.  Individual Medical History/ Review of Systems: Changes? :No   Allergies: Amlodipine and Hydrocodone  Current Medications:  Current Outpatient Medications:  .  albuterol (PROVENTIL HFA;VENTOLIN HFA) 108 (90 BASE) MCG/ACT inhaler, Inhale 2 puffs into the lungs every 6 (six) hours as needed. For ashtma, Disp: , Rfl:  .  [START ON 04/12/2019] amphetamine-dextroamphetamine (ADDERALL XR) 25 MG 24 hr capsule, Take 1 capsule by mouth daily after lunch., Disp: 30 capsule, Rfl: 0 .  budesonide-formoterol (SYMBICORT) 80-4.5 MCG/ACT inhaler, Inhale 2 puffs into the lungs 2 (two) times daily., Disp: , Rfl:  .  busPIRone (BUSPAR) 30 MG tablet, Take 1 tablet (30 mg total) by mouth 2 (two) times a day., Disp: 60 tablet, Rfl: 2 .  fexofenadine (ALLEGRA) 180 MG tablet, Take 180 mg by mouth daily. , Disp: , Rfl:  .  FLUoxetine (PROZAC) 40 MG capsule, TAKE 2 CAPSULES(80 MG) BY MOUTH DAILY, Disp: 60 capsule, Rfl: 2 .  Fluticasone Propionate (FLONASE NA), Place into the nose., Disp: , Rfl:  .  gabapentin (NEURONTIN) 800 MG tablet, TAKE 1 TABLET(800 MG) BY MOUTH THREE TIMES DAILY, Disp: , Rfl:  .  lamoTRIgine (LAMICTAL) 200  MG tablet, TAKE 1 TABLET BY MOUTH TWICE DAILY, Disp: 180 tablet, Rfl: 0 .  linagliptin (TRADJENTA) 5 MG TABS tablet, TAKE 1 TABLET(5 MG) BY MOUTH EVERY DAY, Disp: , Rfl:  .  lisinopril (ZESTRIL) 5 MG tablet, Take 4 tablets (20 mg total) by mouth daily., Disp: 30 tablet, Rfl: 0 .  metFORMIN (GLUCOPHAGE) 500 MG tablet, Take 500 mg by mouth 2 (two) times daily with a meal., Disp: , Rfl:  .  OLANZapine (ZYPREXA) 10 MG tablet, TAKE 1 TABLET(10 MG) BY MOUTH AT BEDTIME, Disp: 90 tablet, Rfl: 1 .  omega-3 acid ethyl esters (LOVAZA) 1 g capsule, Take 1 capsule by mouth daily., Disp: , Rfl:  .  omeprazole (PRILOSEC) 20 MG capsule, Take 20 mg by mouth daily., Disp: , Rfl:  .  ondansetron (ZOFRAN) 4 MG tablet, Take by mouth., Disp: , Rfl:  .  potassium chloride (K-DUR) 10 MEQ tablet, TAKE 1 TABLET(10 MEQ) BY MOUTH EVERY DAY, Disp: , Rfl:  .  promethazine (PHENERGAN) 25 MG tablet, TAKE 1 TABLET BY MOUTH EVERY 4 HOURS AS NEEDED FOR NAUSEA, Disp: , Rfl:  .  varenicline (CHANTIX CONTINUING MONTH PAK) 1 MG tablet, TAKE 1 TABLET BY MOUTH TWICE DAILY( EVERY 12 HOURS), Disp: 56 tablet, Rfl: 2 .  [START ON 06/11/2019] amphetamine-dextroamphetamine (ADDERALL XR) 25 MG 24 hr capsule, Take 1 capsule by mouth daily at 12 noon., Disp: 30 capsule, Rfl: 0 .  [START ON 05/12/2019] amphetamine-dextroamphetamine (ADDERALL XR) 25 MG 24 hr capsule, Take 1 capsule by mouth daily at 12 noon., Disp: 30 capsule, Rfl: 0 .  [START ON 04/12/2019] amphetamine-dextroamphetamine (ADDERALL XR) 30 MG 24 hr capsule, Take 1 capsule (30 mg total) by mouth daily., Disp: 30 capsule, Rfl: 0 .  [START ON 05/12/2019] amphetamine-dextroamphetamine (ADDERALL XR) 30 MG 24 hr capsule, Take 1 capsule (30 mg total) by mouth daily., Disp: 30 capsule, Rfl: 0 .  [START ON 06/11/2019] amphetamine-dextroamphetamine (ADDERALL XR) 30 MG 24 hr capsule, Take 1 capsule (30 mg total) by mouth daily., Disp: 30 capsule, Rfl: 0 .  clonazePAM (KLONOPIN) 0.5 MG tablet, Take 1  tablet (0.5 mg total) by mouth daily as needed for anxiety., Disp: 30 tablet, Rfl: 2 .  guaiFENesin-codeine 100-10 MG/5ML syrup, Take 5 mLs by mouth every 4 (four) hours as needed for cough. (Patient not taking: Reported on 10/03/2018), Disp: 120 mL, Rfl: 0 .  tiZANidine (ZANAFLEX) 4 MG tablet, Take 4 mg by mouth 3 (three) times daily., Disp: , Rfl:  Medication Side Effects: none  Family Medical/ Social History: Changes? No  MENTAL HEALTH EXAM:  There were no vitals taken for this visit.There is no height or weight on file to calculate BMI.  General Appearance: Unable to assess  Eye Contact:  Unable to assess  Speech:  Clear and Coherent  Volume:  Normal  Mood:  Anxious  Affect:  Unable to assess  Thought Process:  Goal Directed  Orientation:  Full (Time, Place, and Person)  Thought Content: Logical   Suicidal Thoughts:  No  Homicidal Thoughts:  No  Memory:  WNL  Judgement:  Good  Insight:  Good  Psychomotor Activity:  Unable to assess  Concentration:  Concentration: Good and Attention Span: Good  Recall:  Good  Fund of Knowledge: Good  Language: Good  Assets:  Desire for Improvement  ADL's:  Intact  Cognition: WNL  Prognosis:  Good    DIAGNOSES:    ICD-10-CM   1. Generalized anxiety disorder  F41.1   2. Major depressive disorder, recurrent episode, moderate (HCC)  F33.1   3. Attention deficit hyperactivity disorder (ADHD), predominantly inattentive type  F90.0   4. Encounter for smoking cessation counseling  Z71.6     Receiving Psychotherapy: No    RECOMMENDATIONS:  I spent 25 minutes with her and 50% of that time was in counseling. We discussed the anxiety at length.  She is already on the maximum dose of Prozac and BuSpar.  She also takes high dose of gabapentin and is also on Lamictal, all of these medications should help with the anxiety.  She still has breakthrough anxiety however mostly due to her circumstances.  I am making an exception and giving her a benzo for  daily use if needed even though she is on a stimulant.  She verbalizes understanding.  We discussed using a longer acting benzo in order to have longer relief.  She wants to try it. Discontinue Xanax. Start Klonopin 0.5 mg 1 p.o. daily as needed. Continue Adderall XR 30 mg every morning. Continue Adderall XR 25 mg daily around lunchtime. Continue BuSpar 30 mg twice daily. Continue Prozac 40 mg 2 p.o. daily. Continue gabapentin 800 mg 1 3 times daily. Continue Lamictal 200 mg 1 twice daily. Continue Zyprexa 10 mg 1 p.o. nightly. Continue Chantix daily.  Continue attempting smoking cessation. Recommend counseling. Return in 3 months.  Melony Overlyeresa Amadou Katzenstein, PA-C   This record has been created using [employer] software.  Chart creation errors have been sought, but may not always have been located and corrected. Such creation errors do not reflect on the standard of medical care.

## 2019-04-06 ENCOUNTER — Other Ambulatory Visit: Payer: Self-pay | Admitting: Physician Assistant

## 2019-04-07 ENCOUNTER — Other Ambulatory Visit: Payer: Self-pay | Admitting: Physician Assistant

## 2019-04-08 ENCOUNTER — Other Ambulatory Visit: Payer: Self-pay | Admitting: Physician Assistant

## 2019-04-08 ENCOUNTER — Telehealth: Payer: Self-pay | Admitting: Physician Assistant

## 2019-04-08 MED ORDER — ZOLPIDEM TARTRATE ER 12.5 MG PO TBCR: 12.5000 mg | EXTENDED_RELEASE_TABLET | Freq: Every evening | ORAL | 2 refills | Status: DC | PRN

## 2019-04-08 NOTE — Telephone Encounter (Signed)
Rx sent in

## 2019-04-08 NOTE — Telephone Encounter (Signed)
Note from 03/20/2019 states stop Ambien upon discharge?

## 2019-04-08 NOTE — Telephone Encounter (Signed)
Pt called [facility] in [place] and they still don't have her rx for Ambien.

## 2019-04-11 ENCOUNTER — Other Ambulatory Visit: Payer: Self-pay | Admitting: Physician Assistant

## 2019-05-11 ENCOUNTER — Other Ambulatory Visit: Payer: Self-pay | Admitting: Physician Assistant

## 2019-06-09 ENCOUNTER — Telehealth: Payer: Self-pay | Admitting: Physician Assistant

## 2019-06-09 NOTE — Telephone Encounter (Signed)
September rx's already on file at her pharmacy

## 2019-06-09 NOTE — Telephone Encounter (Signed)
Pt called to request refill for Adderall  @ [facility] in [place] on file

## 2019-06-16 ENCOUNTER — Encounter: Payer: Self-pay | Admitting: *Deleted

## 2019-06-16 ENCOUNTER — Ambulatory Visit
Admission: EM | Admit: 2019-06-16 | Discharge: 2019-06-16 | Disposition: A | Discharge status: Home / Self Care | Payer: Medicare Other | Attending: Family Medicine | Admitting: Family Medicine

## 2019-06-16 DIAGNOSIS — S0502XA Injury of conjunctiva and corneal abrasion without foreign body, left eye, initial encounter: Secondary | ICD-10-CM

## 2019-06-16 DIAGNOSIS — X838XXA Intentional self-harm by other specified means, initial encounter: Secondary | ICD-10-CM | POA: Diagnosis not present

## 2019-06-16 MED ORDER — ERYTHROMYCIN 5 MG/GM OP OINT: 1.0000 "application " | TOPICAL_OINTMENT | Freq: Four times a day (QID) | OPHTHALMIC | 0 refills | Status: AC

## 2019-06-16 NOTE — Discharge Instructions (Signed)
Take medication as prescribed. Cool compresses.   Follow-up with your ophthalmologist in 2 to 3 days as discussed.  Follow up with your primary care physician this week as needed. Return to Urgent care for new or worsening concerns.

## 2019-06-16 NOTE — ED Provider Notes (Signed)
[facility] ____________________________________________  Time seen: Approximately 12:40 PM  I have reviewed the triage vital signs and the nursing notes.   HISTORY  Chief Complaint Eye Pain  HPI Katrina Booker is a 43 y.o. female presenting for evaluation of left eye discomfort and irritation immediately after accidentally scratching her eye yesterday.  States that she was scratching her left eyebrow and her pinky fingernail accidentally went into her eye and scratched her eye.  Reports she has had foreign body sensation, tearing and some light sensitivity since.  Denies any other injury.  Denies foreign body or other trauma.  Last tetanus immunization 2016.  Has had clear watering, no colored drainage.  Reports blurry left eye vision due to watering of left eye but no vision loss.  No gaps in vision.  Wears glasses, does not wear contacts.  Has tried over-the-counter lubricating eyedrops without improvement.  Denies other aggravating alleviating factors.  Reports otherwise doing well.  Dorothey BasemanBronstein, David, MD: PCP   Past Medical History:  Diagnosis Date  . ADHD (attention deficit hyperactivity disorder)   . Asthma   . Bipolar 1 disorder (HCC)   . Diabetes mellitus   . GERD (gastroesophageal reflux disease)     Patient Active Problem List   Diagnosis Date Noted  . Hypotension 03/19/2019  . Syncope and collapse 03/19/2019  . Attention deficit hyperactivity disorder (ADHD) 07/24/2018  . Insomnia 07/24/2018    Past Surgical History:  Procedure Laterality Date  . ABDOMINAL HYSTERECTOMY    . ABDOMINAL SURGERY    . BACK SURGERY    . HERNIA REPAIR    . INCONTINENCE SURGERY    . OTHER SURGICAL HISTORY     tumor excision on face  . TUBAL LIGATION       No current facility-administered medications for this encounter.   Current Outpatient Medications:  .  amphetamine-dextroamphetamine (ADDERALL XR) 25 MG 24 hr capsule, Take 1 capsule by mouth daily at 12 noon.,  Disp: 30 capsule, Rfl: 0 .  amphetamine-dextroamphetamine (ADDERALL XR) 30 MG 24 hr capsule, Take 1 capsule (30 mg total) by mouth daily., Disp: 30 capsule, Rfl: 0 .  busPIRone (BUSPAR) 30 MG tablet, Take 1 tablet (30 mg total) by mouth 2 (two) times a day., Disp: 60 tablet, Rfl: 2 .  clonazePAM (KLONOPIN) 0.5 MG tablet, Take 1 tablet (0.5 mg total) by mouth daily as needed for anxiety., Disp: 30 tablet, Rfl: 2 .  fexofenadine (ALLEGRA) 180 MG tablet, Take 180 mg by mouth daily. , Disp: , Rfl:  .  FLUoxetine (PROZAC) 40 MG capsule, TAKE 2 CAPSULES(80 MG) BY MOUTH DAILY, Disp: 60 capsule, Rfl: 2 .  Fluticasone Propionate (FLONASE NA), Place into the nose., Disp: , Rfl:  .  gabapentin (NEURONTIN) 800 MG tablet, TAKE 1 TABLET(800 MG) BY MOUTH THREE TIMES DAILY, Disp: , Rfl:  .  lamoTRIgine (LAMICTAL) 200 MG tablet, TAKE 1 TABLET BY MOUTH TWICE DAILY, Disp: 180 tablet, Rfl: 0 .  linagliptin (TRADJENTA) 5 MG TABS tablet, TAKE 1 TABLET(5 MG) BY MOUTH EVERY DAY, Disp: , Rfl:  .  lisinopril (ZESTRIL) 5 MG tablet, Take 4 tablets (20 mg total) by mouth daily., Disp: 30 tablet, Rfl: 0 .  metFORMIN (GLUCOPHAGE) 500 MG tablet, Take 500 mg by mouth 2 (two) times daily with a meal., Disp: , Rfl:  .  OLANZapine (ZYPREXA) 10 MG tablet, TAKE 1 TABLET(10 MG) BY MOUTH AT BEDTIME, Disp: 90 tablet, Rfl: 1 .  omega-3 acid ethyl esters (LOVAZA) 1 g  capsule, Take 1 capsule by mouth daily., Disp: , Rfl:  .  omeprazole (PRILOSEC) 20 MG capsule, Take 20 mg by mouth daily., Disp: , Rfl:  .  potassium chloride (K-DUR) 10 MEQ tablet, TAKE 1 TABLET(10 MEQ) BY MOUTH EVERY DAY, Disp: , Rfl:  .  varenicline (CHANTIX CONTINUING MONTH PAK) 1 MG tablet, TAKE 1 TABLET BY MOUTH TWICE DAILY( EVERY 12 HOURS), Disp: 56 tablet, Rfl: 2 .  zolpidem (AMBIEN CR) 12.5 MG CR tablet, Take 1 tablet (12.5 mg total) by mouth at bedtime as needed for sleep., Disp: 30 tablet, Rfl: 2 .  albuterol (PROVENTIL HFA;VENTOLIN HFA) 108 (90 BASE) MCG/ACT  inhaler, Inhale 2 puffs into the lungs every 6 (six) hours as needed. For ashtma, Disp: , Rfl:  .  amphetamine-dextroamphetamine (ADDERALL XR) 25 MG 24 hr capsule, Take 1 capsule by mouth daily after lunch., Disp: 30 capsule, Rfl: 0 .  amphetamine-dextroamphetamine (ADDERALL XR) 25 MG 24 hr capsule, Take 1 capsule by mouth daily at 12 noon., Disp: 30 capsule, Rfl: 0 .  amphetamine-dextroamphetamine (ADDERALL XR) 30 MG 24 hr capsule, Take 1 capsule (30 mg total) by mouth daily., Disp: 30 capsule, Rfl: 0 .  amphetamine-dextroamphetamine (ADDERALL XR) 30 MG 24 hr capsule, Take 1 capsule (30 mg total) by mouth daily., Disp: 30 capsule, Rfl: 0 .  budesonide-formoterol (SYMBICORT) 80-4.5 MCG/ACT inhaler, Inhale 2 puffs into the lungs 2 (two) times daily., Disp: , Rfl:  .  erythromycin ophthalmic ointment, Place 1 application into both eyes 4 (four) times daily for 5 days., Disp: 3.5 g, Rfl: 0 .  guaiFENesin-codeine 100-10 MG/5ML syrup, Take 5 mLs by mouth every 4 (four) hours as needed for cough. (Patient not taking: Reported on 10/03/2018), Disp: 120 mL, Rfl: 0 .  ondansetron (ZOFRAN) 4 MG tablet, Take by mouth., Disp: , Rfl:  .  promethazine (PHENERGAN) 25 MG tablet, TAKE 1 TABLET BY MOUTH EVERY 4 HOURS AS NEEDED FOR NAUSEA, Disp: , Rfl:  .  tiZANidine (ZANAFLEX) 4 MG tablet, Take 4 mg by mouth 3 (three) times daily., Disp: , Rfl:   Allergies Amlodipine and Hydrocodone  Family History  Problem Relation Age of Onset  . Hypothyroidism Mother   . Diabetes Father   . Heart disease Father     Social History Social History   Tobacco Use  . Smoking status: Current Every Day Smoker    Packs/day: 0.75    Types: Cigarettes  . Smokeless tobacco: Never Used  Substance Use Topics  . Alcohol use: No    Alcohol/week: 0.0 standard drinks  . Drug use: No    Review of Systems Constitutional: No fever/chills Eyes: As above.  ENT: No sore throat. Cardiovascular: Denies chest pain. Respiratory: Denies  shortness of breath. Skin: Negative for rash.   ____________________________________________   PHYSICAL EXAM:  VITAL SIGNS: ED Triage Vitals  Enc Vitals Group     BP 06/16/19 1221 112/83     Pulse Rate 06/16/19 1221 82     Resp 06/16/19 1221 16     Temp 06/16/19 1221 98.3 F (36.8 C)     Temp Source 06/16/19 1221 Oral     SpO2 06/16/19 1221 96 %     Weight --      Height --      Head Circumference --      Peak Flow --      Pain Score 06/16/19 1222 7     Pain Loc --      Pain Edu? --  Excl. in [place]? --     Constitutional: Alert and oriented. Well appearing and in no acute distress. Eyes: Minimal left conjunctival injection.  No right conjunctival injection.  PERRL. EOMI. no pain with EOMs.  Mild clear left eye drainage, no right eye drainage.  Left eye examined with fluorescein, corneal abrasion noted at approximately 4 oclock.  No foreign body noted. ENT      Head: Normocephalic. Cardiovascular: Normal rate, regular rhythm. Grossly normal heart sounds.  Good peripheral circulation. Respiratory: Normal respiratory effort without tachypnea nor retractions. Breath sounds are clear and equal bilaterally. No wheezes, rales, rhonchi. Musculoskeletal:  Steady gait.  Neurologic:  Normal speech and language.  Speech is normal. No gait instability.  Skin:  Skin is warm, dry and intact. No rash noted. Psychiatric: Mood and affect are normal. Speech and behavior are normal. Patient exhibits appropriate insight and judgment   ___________________________________________   LABS (all labs ordered are listed, but only abnormal results are displayed)  Labs Reviewed - No data to display ____________________________________________   PROCEDURES Procedures   Eye exam Procedure explained and verbal consent obtained.  Anesthesia: tetracaine ophthalmic 2 drops Left eye examined with fluorescein strip.  No foreign bodies visualized.  Corneal abrasion noted at 4:00. Patient tolerated  well.   INITIAL IMPRESSION / ASSESSMENT AND PLAN / ED COURSE  Pertinent labs & imaging results that were available during my care of the patient were reviewed by me and considered in my medical decision making (see chart for details).  Well-appearing patient.  No acute distress.  Left eye corneal abrasion noted.  Will start on erythromycin ophthalmic ointment.  Cool compress supportive care.  She has a eye doctor in [place], recommend follow-up in 2 to 3 days.  Tetanus immunization up-to-date.Discussed indication, risks and benefits of medications with patient.  Discussed follow up and return parameters including no resolution or any worsening concerns. Patient verbalized understanding and agreed to plan.   ____________________________________________   FINAL CLINICAL IMPRESSION(S) / ED DIAGNOSES  Final diagnoses:  Abrasion of left cornea, initial encounter     ED Discharge Orders         Ordered    erythromycin ophthalmic ointment  4 times daily     06/16/19 1242           Note: This dictation was prepared with Dragon dictation along with smaller phrase technology. Any transcriptional errors that result from this process are unintentional.         Marylene Land, NP 06/16/19 1357

## 2019-06-16 NOTE — ED Triage Notes (Signed)
Patient reports she scratched her left eye yesterday with fingernail, since then has had pain to eye, photosensitivity, blurred vision and tearing.

## 2019-06-24 ENCOUNTER — Other Ambulatory Visit: Payer: Self-pay | Admitting: Physician Assistant

## 2019-06-24 ENCOUNTER — Telehealth: Payer: Self-pay | Admitting: Physician Assistant

## 2019-06-24 NOTE — Telephone Encounter (Signed)
Pt is requesting Klonopin RF to go on file with [facility] in [place], [place]. She will run out prior to her next appt 10/8. Thanks.

## 2019-06-25 ENCOUNTER — Other Ambulatory Visit: Payer: Self-pay

## 2019-06-25 NOTE — Telephone Encounter (Signed)
Last refill 05/28/2019 pended for approval

## 2019-06-25 NOTE — Telephone Encounter (Signed)
appt 10/13 

## 2019-06-27 ENCOUNTER — Other Ambulatory Visit: Payer: Self-pay | Admitting: Physician Assistant

## 2019-06-27 NOTE — Telephone Encounter (Signed)
Next appt 10/13 Last fill 09/10

## 2019-07-04 ENCOUNTER — Other Ambulatory Visit: Payer: Self-pay

## 2019-07-04 ENCOUNTER — Telehealth: Payer: Self-pay | Admitting: Physician Assistant

## 2019-07-04 MED ORDER — AMPHETAMINE-DEXTROAMPHET ER 30 MG PO CP24: 30.0000 mg | ORAL_CAPSULE | Freq: Every day | ORAL | 0 refills | Status: DC

## 2019-07-04 MED ORDER — AMPHETAMINE-DEXTROAMPHET ER 25 MG PO CP24: 25.0000 mg | ORAL_CAPSULE | Freq: Every day | ORAL | 0 refills | Status: DC

## 2019-07-04 NOTE — Telephone Encounter (Signed)
Spoke with pharmacist and her refill had already expired. Will send a new one.

## 2019-07-04 NOTE — Telephone Encounter (Signed)
Error on last note  Will pend her refills

## 2019-07-04 NOTE — Telephone Encounter (Signed)
Patient called and said that she will need a refill on her adderrall 30 mg and 25 mg to [facility] [address] in [place]

## 2019-07-08 ENCOUNTER — Ambulatory Visit: Payer: Medicare Other | Admitting: Physician Assistant

## 2019-07-18 ENCOUNTER — Other Ambulatory Visit: Payer: Self-pay | Admitting: Physician Assistant

## 2019-07-18 NOTE — Telephone Encounter (Signed)
appt 11/04

## 2019-07-30 ENCOUNTER — Ambulatory Visit (INDEPENDENT_AMBULATORY_CARE_PROVIDER_SITE_OTHER): Payer: BC Managed Care – PPO | Admitting: Physician Assistant

## 2019-07-30 ENCOUNTER — Encounter: Payer: Self-pay | Admitting: Physician Assistant

## 2019-07-30 ENCOUNTER — Other Ambulatory Visit: Payer: Self-pay

## 2019-07-30 DIAGNOSIS — F331 Major depressive disorder, recurrent, moderate: Secondary | ICD-10-CM

## 2019-07-30 DIAGNOSIS — F900 Attention-deficit hyperactivity disorder, predominantly inattentive type: Secondary | ICD-10-CM | POA: Diagnosis not present

## 2019-07-30 DIAGNOSIS — F411 Generalized anxiety disorder: Secondary | ICD-10-CM

## 2019-07-30 DIAGNOSIS — Z716 Tobacco abuse counseling: Secondary | ICD-10-CM | POA: Diagnosis not present

## 2019-07-30 MED ORDER — AMPHETAMINE-DEXTROAMPHET ER 25 MG PO CP24: 25.0000 mg | ORAL_CAPSULE | Freq: Every day | ORAL | 0 refills | Status: DC

## 2019-07-30 MED ORDER — FLUOXETINE HCL 40 MG PO CAPS: ORAL_CAPSULE | ORAL | 2 refills | Status: DC

## 2019-07-30 MED ORDER — AMPHETAMINE-DEXTROAMPHET ER 30 MG PO CP24: 30.0000 mg | ORAL_CAPSULE | Freq: Every day | ORAL | 0 refills | Status: DC

## 2019-07-30 MED ORDER — BUSPIRONE HCL 30 MG PO TABS: 30.0000 mg | ORAL_TABLET | Freq: Three times a day (TID) | ORAL | 1 refills | Status: DC

## 2019-07-30 MED ORDER — VARENICLINE TARTRATE 1 MG PO TABS: ORAL_TABLET | ORAL | 2 refills | Status: DC

## 2019-07-30 NOTE — Progress Notes (Signed)
[facility]  Patient ID: Katrina Booker,  MRN: [id number]  PCP: Juluis Pitch, MD  Date of Evaluation: 07/30/2019 Time spent:15 minutes  Chief Complaint:  Chief Complaint    ADD; Depression; Follow-up     Virtual Visit via Telephone Note  I connected with patient by a video enabled telemedicine application or telephone, with their informed consent, and verified patient privacy and that I am speaking with the correct person using two identifiers.  I am private, in my office and the patient is home.  I discussed the limitations, risks, security and privacy concerns of performing an evaluation and management service by telephone and the availability of in person appointments. I also discussed with the patient that there may be a patient responsible charge related to this service. The patient expressed understanding and agreed to proceed.   I discussed the assessment and treatment plan with the patient. The patient was provided an opportunity to ask questions and all were answered. The patient agreed with the plan and demonstrated an understanding of the instructions.   The patient was advised to call back or seek an in-person evaluation if the symptoms worsen or if the condition fails to improve as anticipated.  I provided 15 minutes of non-face-to-face time during this encounter.  HISTORY/CURRENT STATUS: HPI for routine med check.  Patient continues to have a lot of anxiety.  She gets panicky every day for 1 reason or another.  She is still taking care of an elderly parent, her mom, who has Alzheimer's.  She is basically running 2 households, and taking care of her grandchild.   She does not feel that the Adderall causes more anxiety.  She feels like if she does not use it and is not able to finish things around the house or be motivated enough to take care of her parents and her grandchild then she gets more anxious. If she does not have the Adderall, she cannot focus  in order to keep up with all of these responsibilities.   Patient states she had to see a GI specialist recently.  She was diagnosed with irritable bowel syndrome and the doctor suggested that the patient talk to me about her Klonopin dose.  According to the patient, she felt that she may need more in order to combat the anxiety that can lead to the IBS.  She is still on Chantix and is not craving cigarettes as much.  She has now decreased to 4 cigarettes a day, at the most!  Patient denies loss of interest in usual activities and is able to enjoy things.  Denies decreased energy or motivation.  Appetite has not changed.  No extreme sadness, tearfulness, or feelings of hopelessness.  Denies any changes in concentration, making decisions or remembering things.  Denies suicidal or homicidal thoughts.  Patient denies increased energy with decreased need for sleep, no increased talkativeness, no racing thoughts, no impulsivity or risky behaviors, no increased spending, no increased libido, no grandiosity.  Denies dizziness, syncope, seizures, numbness, tingling, tremor, tics, unsteady gait, slurred speech, confusion. Denies muscle or joint pain, stiffness, or dystonia.  Individual Medical History/ Review of Systems: Changes? :Yes IBS has been diagnosed.  She is also having more trouble with her neck and will have to have another surgery on the C-spine at some point in the future.  Past medications for mental health diagnoses include: Chantix, Lamictal, BuSpar, symbyax, Zyprexa, Xanax, Latuda, Effexor, Sonata, Ambien, Prozac, Librium, Wellbutrin, Adderall XR, Cymbalta, Hydroxyzine, Klonopin, Zoloft.  Allergies: Amlodipine and Hydrocodone  Current Medications:  Current Outpatient Medications:  .  albuterol (PROVENTIL HFA;VENTOLIN HFA) 108 (90 BASE) MCG/ACT inhaler, Inhale 2 puffs into the lungs every 6 (six) hours as needed. For ashtma, Disp: , Rfl:  .  [START ON 10/06/2019]  amphetamine-dextroamphetamine (ADDERALL XR) 25 MG 24 hr capsule, Take 1 capsule by mouth daily after lunch., Disp: 30 capsule, Rfl: 0 .  [START ON 09/06/2019] amphetamine-dextroamphetamine (ADDERALL XR) 25 MG 24 hr capsule, Take 1 capsule by mouth daily at 12 noon., Disp: 30 capsule, Rfl: 0 .  [START ON 08/08/2019] amphetamine-dextroamphetamine (ADDERALL XR) 25 MG 24 hr capsule, Take 1 capsule by mouth daily at 12 noon., Disp: 30 capsule, Rfl: 0 .  [START ON 10/06/2019] amphetamine-dextroamphetamine (ADDERALL XR) 30 MG 24 hr capsule, Take 1 capsule (30 mg total) by mouth daily., Disp: 30 capsule, Rfl: 0 .  [START ON 09/06/2019] amphetamine-dextroamphetamine (ADDERALL XR) 30 MG 24 hr capsule, Take 1 capsule (30 mg total) by mouth daily., Disp: 30 capsule, Rfl: 0 .  [START ON 08/08/2019] amphetamine-dextroamphetamine (ADDERALL XR) 30 MG 24 hr capsule, Take 1 capsule (30 mg total) by mouth daily., Disp: 30 capsule, Rfl: 0 .  budesonide-formoterol (SYMBICORT) 80-4.5 MCG/ACT inhaler, Inhale 2 puffs into the lungs 2 (two) times daily., Disp: , Rfl:  .  clonazePAM (KLONOPIN) 0.5 MG tablet, TAKE 1 TABLET(0.5 MG) BY MOUTH DAILY AS NEEDED FOR ANXIETY, Disp: 30 tablet, Rfl: 1 .  fexofenadine (ALLEGRA) 180 MG tablet, Take 180 mg by mouth daily. , Disp: , Rfl:  .  FLUoxetine (PROZAC) 40 MG capsule, TAKE 2 CAPSULES(80 MG) BY MOUTH DAILY, Disp: 60 capsule, Rfl: 2 .  Fluticasone Propionate (FLONASE NA), Place into the nose., Disp: , Rfl:  .  gabapentin (NEURONTIN) 800 MG tablet, TAKE 1 TABLET(800 MG) BY MOUTH THREE TIMES DAILY, Disp: , Rfl:  .  lamoTRIgine (LAMICTAL) 200 MG tablet, TAKE 1 TABLET BY MOUTH TWICE DAILY, Disp: 180 tablet, Rfl: 0 .  linagliptin (TRADJENTA) 5 MG TABS tablet, TAKE 1 TABLET(5 MG) BY MOUTH EVERY DAY, Disp: , Rfl:  .  lisinopril (ZESTRIL) 5 MG tablet, Take 4 tablets (20 mg total) by mouth daily., Disp: 30 tablet, Rfl: 0 .  metFORMIN (GLUCOPHAGE) 500 MG tablet, Take 500 mg by mouth 2 (two) times  daily with a meal., Disp: , Rfl:  .  OLANZapine (ZYPREXA) 10 MG tablet, TAKE 1 TABLET(10 MG) BY MOUTH AT BEDTIME, Disp: 90 tablet, Rfl: 1 .  omega-3 acid ethyl esters (LOVAZA) 1 g capsule, Take 1 capsule by mouth daily., Disp: , Rfl:  .  ondansetron (ZOFRAN) 4 MG tablet, Take by mouth., Disp: , Rfl:  .  pantoprazole (PROTONIX) 40 MG tablet, Take by mouth., Disp: , Rfl:  .  potassium chloride (K-DUR) 10 MEQ tablet, TAKE 1 TABLET(10 MEQ) BY MOUTH EVERY DAY, Disp: , Rfl:  .  promethazine (PHENERGAN) 25 MG tablet, TAKE 1 TABLET BY MOUTH EVERY 4 HOURS AS NEEDED FOR NAUSEA, Disp: , Rfl:  .  varenicline (CHANTIX CONTINUING MONTH PAK) 1 MG tablet, TAKE 1 TABLET BY MOUTH TWICE DAILY( EVERY 12 HOURS), Disp: 56 tablet, Rfl: 2 .  zolpidem (AMBIEN CR) 12.5 MG CR tablet, TAKE 1 TABLET(12.5 MG) BY MOUTH AT BEDTIME AS NEEDED FOR SLEEP, Disp: 30 tablet, Rfl: 2 .  busPIRone (BUSPAR) 30 MG tablet, Take 1 tablet (30 mg total) by mouth 3 (three) times daily., Disp: 90 tablet, Rfl: 1 .  gabapentin (NEURONTIN) 400 MG capsule, Take 800 mg by mouth  4 (four) times daily., Disp: , Rfl:  .  guaiFENesin-codeine 100-10 MG/5ML syrup, Take 5 mLs by mouth every 4 (four) hours as needed for cough. (Patient not taking: Reported on 10/03/2018), Disp: 120 mL, Rfl: 0 .  methocarbamol (ROBAXIN) 750 MG tablet, Take 750 mg by mouth 3 (three) times daily., Disp: , Rfl:  .  omeprazole (PRILOSEC) 20 MG capsule, Take 20 mg by mouth daily., Disp: , Rfl:  .  tiZANidine (ZANAFLEX) 4 MG tablet, Take 4 mg by mouth 3 (three) times daily., Disp: , Rfl:  Medication Side Effects: none  Family Medical/ Social History: Changes? No  MENTAL HEALTH EXAM:  There were no vitals taken for this visit.There is no height or weight on file to calculate BMI.  General Appearance: Unable to assess  Eye Contact:  Unable to assess  Speech:  Clear and Coherent  Volume:  Normal  Mood:  Euthymic  Affect:  Unable to assess  Thought Process:  Goal Directed and  Descriptions of Associations: Intact  Orientation:  Full (Time, Place, and Person)  Thought Content: Logical   Suicidal Thoughts:  No  Homicidal Thoughts:  No  Memory:  WNL  Judgement:  Good  Insight:  Good  Psychomotor Activity:  Unable to assess  Concentration:  Concentration: Good and Attention Span: Good  Recall:  Good  Fund of Knowledge: Good  Language: Good  Assets:  Desire for Improvement  ADL's:  Intact  Cognition: WNL  Prognosis:  Good    DIAGNOSES:    ICD-10-CM   1. Generalized anxiety disorder  F41.1   2. Major depressive disorder, recurrent episode, moderate (HCC)  F33.1   3. Attention deficit hyperactivity disorder (ADHD), predominantly inattentive type  F90.0   4. Encounter for smoking cessation counseling  Z71.6     Receiving Psychotherapy: No    RECOMMENDATIONS:  We again discussed the fact that she cannot consistently be on a stimulant and a benzo.  I am making an exception for her due to the tremendous amount of stress that she is under plus the fact that she needs to stay on task and to be stimulated in order to take care of her granddaughter and her elderly mother with Alzheimer's.  However I am not increasing the Klonopin.  We will either increase the BuSpar or the Zyprexa to help prevent the anxiety.  Patient again tells me that when she is taking a benzo and a stimulant in the past it did not bother her.  Once again I said no, because they essentially offset each other when taken together. Continue Klonopin 0.5 mg 1 p.o. daily as needed. Continue Adderall XR 30 mg every morning. Continue Adderall XR 25 mg daily around lunchtime. Increase BuSpar 30 mg to 3 times daily. Continue Prozac 40 mg 2 p.o. daily. Continue gabapentin 800 mg 1 3 times daily. Continue Lamictal 200 mg 1 twice daily. Continue Zyprexa 10 mg 1 p.o. nightly. Continue Chantix daily.  Continue attempting smoking cessation. Recommend counseling. Return in 6-8 wks.  Melony Overly, PA-C

## 2019-08-08 ENCOUNTER — Ambulatory Visit
Admission: EM | Admit: 2019-08-08 | Discharge: 2019-08-08 | Disposition: A | Discharge status: Home / Self Care | Payer: BC Managed Care – PPO | Attending: Family Medicine | Admitting: Family Medicine

## 2019-08-08 ENCOUNTER — Other Ambulatory Visit: Payer: Self-pay

## 2019-08-08 ENCOUNTER — Encounter: Payer: Self-pay | Admitting: Emergency Medicine

## 2019-08-08 ENCOUNTER — Ambulatory Visit: Payer: BC Managed Care – PPO

## 2019-08-08 DIAGNOSIS — S9032XA Contusion of left foot, initial encounter: Secondary | ICD-10-CM

## 2019-08-08 DIAGNOSIS — W228XXA Striking against or struck by other objects, initial encounter: Secondary | ICD-10-CM

## 2019-08-08 DIAGNOSIS — S93602A Unspecified sprain of left foot, initial encounter: Secondary | ICD-10-CM | POA: Diagnosis present

## 2019-08-08 NOTE — Discharge Instructions (Signed)
Rest, ice, compression, elevation Over the counter  extra strength tylenol as needed
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
## 2019-08-21 ENCOUNTER — Other Ambulatory Visit: Payer: Self-pay | Admitting: Physician Assistant

## 2019-09-06 ENCOUNTER — Other Ambulatory Visit: Payer: Self-pay | Admitting: Physician Assistant

## 2019-09-07 NOTE — Telephone Encounter (Signed)
Apt 11/04 due back 6-8 weeks

## 2019-09-08 ENCOUNTER — Telehealth: Payer: Self-pay | Admitting: Physician Assistant

## 2019-09-08 NOTE — Telephone Encounter (Signed)
Patient called and said that the pharmacy filled her adderall but only had ten pills to give her. So she needs another script sent into the [facility] in [place] of the [place]  20 pills .

## 2019-09-08 NOTE — Telephone Encounter (Signed)
Spoke with patient, then to [facility] pharmacist said the rest of her supply #20 is waiting for her to pick up. She does not need a new Rx. Advised patient

## 2019-09-08 NOTE — Telephone Encounter (Signed)
Patient received #30 of Adderall ER 30 mg on 09/06/2019 for 30 day and received #30 of Adderall ER 25 mg on 08/27/2019 for 30 day per PMP  Unclear what patient is referring to. Will contact patient for clarification

## 2019-09-13 ENCOUNTER — Other Ambulatory Visit: Payer: Self-pay | Admitting: Physician Assistant

## 2019-09-14 NOTE — Telephone Encounter (Signed)
Is she still on this? Didn't see in your note.

## 2019-09-18 ENCOUNTER — Other Ambulatory Visit: Payer: Self-pay | Admitting: Physician Assistant

## 2019-09-22 ENCOUNTER — Other Ambulatory Visit: Payer: Self-pay | Admitting: Physician Assistant

## 2019-09-23 NOTE — Telephone Encounter (Signed)
Okay continue refills

## 2019-10-08 ENCOUNTER — Other Ambulatory Visit: Payer: Self-pay | Admitting: Physician Assistant

## 2019-10-09 NOTE — Telephone Encounter (Signed)
Last apt 11/04, was due back 6-8 weeks. Nothing scheduled  I didn't see Remus Loffler mentioned in last ov, last refill 09/15/2019

## 2019-10-10 ENCOUNTER — Other Ambulatory Visit: Payer: Self-pay | Admitting: Surgery

## 2019-10-10 DIAGNOSIS — K432 Incisional hernia without obstruction or gangrene: Secondary | ICD-10-CM

## 2019-10-16 ENCOUNTER — Ambulatory Visit
Admission: RE | Admit: 2019-10-16 | Discharge: 2019-10-16 | Disposition: A | Discharge status: Home / Self Care | Payer: BC Managed Care – PPO | Source: Ambulatory Visit | Attending: Surgery | Admitting: Surgery

## 2019-10-16 ENCOUNTER — Other Ambulatory Visit: Payer: Self-pay

## 2019-10-16 DIAGNOSIS — K432 Incisional hernia without obstruction or gangrene: Secondary | ICD-10-CM | POA: Diagnosis present

## 2019-10-17 ENCOUNTER — Telehealth: Payer: Self-pay | Admitting: Physician Assistant

## 2019-10-17 NOTE — Telephone Encounter (Signed)
Pt has appt 1/26 and is requesting refills for Adderall  XR 30 mg &  Adderall 25 mg XR and Ambien 12.5 mg CR  @ [facility] in [place]

## 2019-10-20 ENCOUNTER — Other Ambulatory Visit: Payer: Self-pay | Admitting: Physician Assistant

## 2019-10-20 NOTE — Telephone Encounter (Signed)
According to the med list, she has one dated 10/06/19 that can be filled so she needs to call the pharmacy.  The others will be sent in when due.  Thanks.

## 2019-10-21 ENCOUNTER — Encounter: Payer: Self-pay | Admitting: Physician Assistant

## 2019-10-21 ENCOUNTER — Ambulatory Visit (INDEPENDENT_AMBULATORY_CARE_PROVIDER_SITE_OTHER): Payer: BC Managed Care – PPO | Admitting: Physician Assistant

## 2019-10-21 DIAGNOSIS — F411 Generalized anxiety disorder: Secondary | ICD-10-CM | POA: Diagnosis not present

## 2019-10-21 DIAGNOSIS — F331 Major depressive disorder, recurrent, moderate: Secondary | ICD-10-CM

## 2019-10-21 DIAGNOSIS — F900 Attention-deficit hyperactivity disorder, predominantly inattentive type: Secondary | ICD-10-CM | POA: Diagnosis not present

## 2019-10-21 DIAGNOSIS — Z716 Tobacco abuse counseling: Secondary | ICD-10-CM | POA: Diagnosis not present

## 2019-10-21 MED ORDER — LAMOTRIGINE 200 MG PO TABS: 200.0000 mg | ORAL_TABLET | Freq: Two times a day (BID) | ORAL | 0 refills | Status: DC

## 2019-10-21 MED ORDER — AMPHETAMINE-DEXTROAMPHET ER 30 MG PO CP24: 30.0000 mg | ORAL_CAPSULE | Freq: Every day | ORAL | 0 refills | Status: DC

## 2019-10-21 MED ORDER — CLONAZEPAM 0.5 MG PO TABS: ORAL_TABLET | ORAL | 1 refills | Status: DC

## 2019-10-21 MED ORDER — OLANZAPINE 15 MG PO TABS: 15.0000 mg | ORAL_TABLET | Freq: Every day | ORAL | 1 refills | Status: DC

## 2019-10-21 MED ORDER — AMPHETAMINE-DEXTROAMPHET ER 25 MG PO CP24: 25.0000 mg | ORAL_CAPSULE | Freq: Every day | ORAL | 0 refills | Status: DC

## 2019-10-21 NOTE — Progress Notes (Signed)
[facility]  Patient ID: Katrina Booker,  MRN: [id number]  PCP: Dorothey Baseman, MD  Date of Evaluation: 10/21/2019 Time spent:30 minutes  Chief Complaint:  Chief Complaint    Anxiety; Depression; ADD; Follow-up     Virtual Visit via Video Note  I connected with Katrina Booker on 10/23/19 at 11:00 AM EST by a video enabled telemedicine application and verified that I am speaking with the correct person using two identifiers.  Location: Patient: Home Provider: Office  Due to technical difficulties with the patient, I was unable to do a video visit.   I discussed the limitations of evaluation and management by telemedicine and the availability of in person appointments. The patient expressed understanding and agreed to proceed.  I provided  30 minutes of non-face-to-face time during this encounter.   Melony Overly, PA-C   HISTORY/CURRENT STATUS: HPI  For routine med check.  Father has hurt his back.  She is continuing to take care of of her aging parents which has been difficult running their household as well as hers.  Her mom has Alzheimer's.  Now that her father has hurt his back, has herniated disc or more than 1, she is even more stressed.  Also she is helping out with her grandchild.  "I am overwhelmed.  I feel like it is never going to get better."  The anxiety is an off and on all day feeling that something bad is going to happen.  The Klonopin does help but she does not feel like it is enough.  She tries to do deep breathing exercises like she has been taught in therapy.  It helps "a little bit."  She has trouble falling asleep at night because of racing thoughts.  Once she gets to sleep, she can stay asleep.  She does feel rested in the mornings but when she wakes up she has an anxious feeling of wondering what the day will bring.  She is still trying to quit smoking.  She is taking this Chantix which has helped decrease the cravings.  She is slowly  making progress weaning off the cigarettes.  She does feel sad a lot.  Cries at times but not without a trigger.  Energy and motivation are low but states she is so tired doing everything for her parents as well as her grandchild, that takes away a lot of her energy.  She does not really have time to enjoy things or do anything for herself.  She denies suicidal or homicidal thoughts.  She is able to focus well with the Adderall.  States this gives her the ability to finish task and give her some energy so that she can take care of everything that she has to do.  Patient denies increased energy with decreased need for sleep, no increased talkativeness, no racing thoughts, no impulsivity or risky behaviors, no increased spending, no increased libido, no grandiosity.  Denies dizziness, syncope, seizures, numbness, tingling, tremor, tics, unsteady gait, slurred speech, confusion. Denies muscle or joint pain, stiffness, or dystonia.  Individual Medical History/ Review of Systems: Changes? :No    Past medications for mental health diagnoses include: Chantix, Lamictal, BuSpar, symbyax, Zyprexa, Xanax, Latuda, Effexor, Sonata, Ambien, Prozac, Librium, Wellbutrin, Adderall XR, Cymbalta, Hydroxyzine, Klonopin, Zoloft  Allergies: Amlodipine and Hydrocodone  Current Medications:  Current Outpatient Medications:  .  albuterol (PROVENTIL HFA;VENTOLIN HFA) 108 (90 BASE) MCG/ACT inhaler, Inhale 2 puffs into the lungs every 6 (six) hours as needed. For ashtma, Disp: ,  Rfl:  .  amphetamine-dextroamphetamine (ADDERALL XR) 25 MG 24 hr capsule, Take 1 capsule by mouth daily after lunch., Disp: 30 capsule, Rfl: 0 .  [START ON 11/20/2019] amphetamine-dextroamphetamine (ADDERALL XR) 25 MG 24 hr capsule, Take 1 capsule by mouth daily at 12 noon., Disp: 30 capsule, Rfl: 0 .  amphetamine-dextroamphetamine (ADDERALL XR) 25 MG 24 hr capsule, Take 1 capsule by mouth daily at 12 noon., Disp: 30 capsule, Rfl: 0 .   amphetamine-dextroamphetamine (ADDERALL XR) 30 MG 24 hr capsule, Take 1 capsule (30 mg total) by mouth daily., Disp: 30 capsule, Rfl: 0 .  [START ON 12/02/2019] amphetamine-dextroamphetamine (ADDERALL XR) 30 MG 24 hr capsule, Take 1 capsule (30 mg total) by mouth daily., Disp: 30 capsule, Rfl: 0 .  [START ON 11/05/2019] amphetamine-dextroamphetamine (ADDERALL XR) 30 MG 24 hr capsule, Take 1 capsule (30 mg total) by mouth daily., Disp: 30 capsule, Rfl: 0 .  budesonide-formoterol (SYMBICORT) 80-4.5 MCG/ACT inhaler, Inhale 2 puffs into the lungs 2 (two) times daily., Disp: , Rfl:  .  busPIRone (BUSPAR) 30 MG tablet, TAKE 1 TABLET(30 MG) BY MOUTH THREE TIMES DAILY, Disp: 90 tablet, Rfl: 1 .  clonazePAM (KLONOPIN) 0.5 MG tablet, TAKE 1 TABLET(0.5 MG) BY MOUTH DAILY AS NEEDED FOR ANXIETY, Disp: 30 tablet, Rfl: 1 .  Dulaglutide (TRULICITY) 0.75 MG/0.5ML SOPN, ADMINISTER 0.75 MG UNDER THE SKIN 1 TIME A WEEK, Disp: , Rfl:  .  fexofenadine (ALLEGRA) 180 MG tablet, Take 180 mg by mouth daily. , Disp: , Rfl:  .  FLUoxetine (PROZAC) 40 MG capsule, TAKE 2 CAPSULES(80 MG) BY MOUTH DAILY, Disp: 60 capsule, Rfl: 2 .  Fluticasone Propionate (FLONASE NA), Place into the nose., Disp: , Rfl:  .  gabapentin (NEURONTIN) 800 MG tablet, 800 mg. Qid, Disp: , Rfl:  .  lamoTRIgine (LAMICTAL) 200 MG tablet, Take 1 tablet (200 mg total) by mouth 2 (two) times daily., Disp: 180 tablet, Rfl: 0 .  lisinopril (ZESTRIL) 5 MG tablet, Take 4 tablets (20 mg total) by mouth daily., Disp: 30 tablet, Rfl: 0 .  metFORMIN (GLUCOPHAGE) 500 MG tablet, Take 500 mg by mouth 2 (two) times daily with a meal., Disp: , Rfl:  .  methocarbamol (ROBAXIN) 750 MG tablet, Take 750 mg by mouth 3 (three) times daily., Disp: , Rfl:  .  OLANZapine (ZYPREXA) 10 MG tablet, TAKE 1 TABLET(10 MG) BY MOUTH AT BEDTIME, Disp: 90 tablet, Rfl: 1 .  omega-3 acid ethyl esters (LOVAZA) 1 g capsule, Take 1 capsule by mouth daily., Disp: , Rfl:  .  ondansetron (ZOFRAN) 4 MG  tablet, Take by mouth., Disp: , Rfl:  .  pantoprazole (PROTONIX) 40 MG tablet, Take by mouth., Disp: , Rfl:  .  potassium chloride (K-DUR) 10 MEQ tablet, TAKE 1 TABLET(10 MEQ) BY MOUTH EVERY DAY, Disp: , Rfl:  .  promethazine (PHENERGAN) 25 MG tablet, TAKE 1 TABLET BY MOUTH EVERY 4 HOURS AS NEEDED FOR NAUSEA, Disp: , Rfl:  .  varenicline (CHANTIX) 1 MG tablet, TAKE 1 TABLET BY MOUTH EVERY 12 HOURS, Disp: 56 tablet, Rfl: 2 .  zolpidem (AMBIEN CR) 12.5 MG CR tablet, TAKE 1 TABLET(12.5 MG) BY MOUTH AT BEDTIME AS NEEDED FOR SLEEP, Disp: 30 tablet, Rfl: 0 .  guaiFENesin-codeine 100-10 MG/5ML syrup, Take 5 mLs by mouth every 4 (four) hours as needed for cough. (Patient not taking: Reported on 10/03/2018), Disp: 120 mL, Rfl: 0 .  linagliptin (TRADJENTA) 5 MG TABS tablet, TAKE 1 TABLET(5 MG) BY MOUTH EVERY DAY, Disp: ,  Rfl:  .  OLANZapine (ZYPREXA) 15 MG tablet, Take 1 tablet (15 mg total) by mouth at bedtime., Disp: 30 tablet, Rfl: 1 .  omeprazole (PRILOSEC) 20 MG capsule, Take 20 mg by mouth daily., Disp: , Rfl:  .  tiZANidine (ZANAFLEX) 4 MG tablet, Take 4 mg by mouth 3 (three) times daily., Disp: , Rfl:  Medication Side Effects: none  Family Medical/ Social History: Changes? No  MENTAL HEALTH EXAM:  There were no vitals taken for this visit.There is no height or weight on file to calculate BMI.  General Appearance: Unable to assess  Eye Contact:  Unable to assess  Speech:  Clear and Coherent  Volume:  Normal  Mood:  Depressed and Hopeless  Affect:  Unable to assess  Thought Process:  Goal Directed and Descriptions of Associations: Intact  Orientation:  Full (Time, Place, and Person)  Thought Content: Logical   Suicidal Thoughts:  No  Homicidal Thoughts:  No  Memory:  WNL  Judgement:  Good  Insight:  Good  Psychomotor Activity:  Unable to assess  Concentration:  Concentration: Good and Attention Span: Good  Recall:  Good  Fund of Knowledge: Good  Language: Good  Assets:  Desire for  Improvement  ADL's:  Intact  Cognition: WNL  Prognosis:  Good    DIAGNOSES:    ICD-10-CM   1. Major depressive disorder, recurrent episode, moderate (HCC)  F33.1   2. Generalized anxiety disorder  F41.1   3. Attention deficit hyperactivity disorder (ADHD), predominantly inattentive type  F90.0   4. Encounter for smoking cessation counseling  Z71.6     Receiving Psychotherapy: No    RECOMMENDATIONS:  I spent 30 minutes with her. We discussed increasing the Zyprexa which will help with the anxiety.  I do recommend she get back in counseling as this can help give her coping skills to handle all the responsibilities she has to face. Again she is aware that I am making an exception giving her a stimulant as well as a benzo.  She is under tremendous stress and has a lot of responsibilities and therefore it is my clinical judgment that she needs both. Continue Adderall XR 30 mg every morning. Continue Adderall XR 25 mg 1 p.o. at noon. Continue BuSpar 30 mg 1 p.o. 3 times daily. Continue Klonopin 0.5 mg 1 p.o. daily as needed. Continue Prozac 40 mg, 2 p.o. daily. Continue gabapentin 800 mg, 1 p.o. 4 times daily. Continue Lamictal 200 mg, 1 p.o. twice daily. Increase Zyprexa to 15 mg p.o. nightly. Continue Ambien CR 12.5 mg nightly as needed sleep. Continue Chantix 1 mg twice daily.  At the next visit, I will address whether she needs this further or not.  She has been on this several months already and if it is not completely preventing smoking, it may not be worth her staying on it. Recommend therapy. Return in 6 weeks.  Donnal Moat, PA-C

## 2019-10-21 NOTE — Telephone Encounter (Signed)
Yes she does have one on file that can be filled. I will call and make her aware.

## 2019-11-08 ENCOUNTER — Other Ambulatory Visit: Payer: Self-pay | Admitting: Physician Assistant

## 2019-11-09 NOTE — Telephone Encounter (Signed)
Apt 11/27/2019

## 2019-11-10 ENCOUNTER — Telehealth: Payer: Self-pay

## 2019-11-10 NOTE — Telephone Encounter (Signed)
Prior authorization submitted for Amphetamine-dextroamphetamine ER 30 mg #30 through [facility] QG#[id number] Approved effective 11/06/2019-11/05/2020   Submitted through cover my meds

## 2019-11-16 ENCOUNTER — Other Ambulatory Visit: Payer: Self-pay | Admitting: Physician Assistant

## 2019-11-27 ENCOUNTER — Ambulatory Visit (INDEPENDENT_AMBULATORY_CARE_PROVIDER_SITE_OTHER): Payer: BC Managed Care – PPO | Admitting: Physician Assistant

## 2019-11-27 ENCOUNTER — Encounter: Payer: Self-pay | Admitting: Physician Assistant

## 2019-11-27 DIAGNOSIS — F411 Generalized anxiety disorder: Secondary | ICD-10-CM

## 2019-11-27 DIAGNOSIS — F329 Major depressive disorder, single episode, unspecified: Secondary | ICD-10-CM | POA: Diagnosis not present

## 2019-11-27 DIAGNOSIS — F17200 Nicotine dependence, unspecified, uncomplicated: Secondary | ICD-10-CM | POA: Diagnosis not present

## 2019-11-27 DIAGNOSIS — F900 Attention-deficit hyperactivity disorder, predominantly inattentive type: Secondary | ICD-10-CM

## 2019-11-27 DIAGNOSIS — Z79899 Other long term (current) drug therapy: Secondary | ICD-10-CM

## 2019-11-27 MED ORDER — FLUOXETINE HCL 40 MG PO CAPS: ORAL_CAPSULE | ORAL | 1 refills | Status: DC

## 2019-11-27 MED ORDER — CLONAZEPAM 0.5 MG PO TABS: ORAL_TABLET | ORAL | 5 refills | Status: DC

## 2019-11-27 MED ORDER — AMPHETAMINE-DEXTROAMPHET ER 30 MG PO CP24: 30.0000 mg | ORAL_CAPSULE | Freq: Every day | ORAL | 0 refills | Status: DC

## 2019-11-27 MED ORDER — AMPHETAMINE-DEXTROAMPHET ER 25 MG PO CP24: 25.0000 mg | ORAL_CAPSULE | Freq: Every day | ORAL | 0 refills | Status: DC

## 2019-11-27 MED ORDER — ZOLPIDEM TARTRATE ER 12.5 MG PO TBCR: EXTENDED_RELEASE_TABLET | ORAL | 5 refills | Status: DC

## 2019-11-27 MED ORDER — OLANZAPINE 15 MG PO TABS: 15.0000 mg | ORAL_TABLET | Freq: Every day | ORAL | 1 refills | Status: DC

## 2019-11-27 NOTE — Progress Notes (Signed)
[facility]  Patient ID: Katrina Booker,  MRN: [id number]  PCP: Juluis Pitch, MD  Date of Evaluation: 11/27/2019 Time spent:25 minutes  Chief Complaint:  Chief Complaint    Anxiety; Depression; ADD; Insomnia     Virtual Visit via Telephone Note  I connected with patient by a video enabled telemedicine application or telephone, with their informed consent, and verified patient privacy and that I am speaking with the correct person using two identifiers.  I am private, in my office and the patient is home.   I discussed the limitations, risks, security and privacy concerns of performing an evaluation and management service by telephone and the availability of in person appointments. I also discussed with the patient that there may be a patient responsible charge related to this service. The patient expressed understanding and agreed to proceed.   I discussed the assessment and treatment plan with the patient. The patient was provided an opportunity to ask questions and all were answered. The patient agreed with the plan and demonstrated an understanding of the instructions.   The patient was advised to call back or seek an in-person evaluation if the symptoms worsen or if the condition fails to improve as anticipated.  I provided 25 minutes of non-face-to-face time during this encounter.   HISTORY/CURRENT STATUS: HPI for routine med check.  At the last visit, we increased the Zyprexa.  She has not really felt any difference.  She does not want to change any medications though.  "I feel like the medications are working as well as they can."  She is under a lot of stress with family responsibilities and caring for her mother who is ill.  She still has a lot of anxiety and the Klonopin helps but would be more helpful if she could take it more often.  She has generalized sense of anxiety, as well as panic attacks at times.  States that attention is good without easy  distractibility.  Able to focus on things and finish tasks to completion.  Adderall is helpful and she cannot imagine how bad things would be if she was not on it.  She does have some depression but states the medications are helping some.  Says it is hard to know where the medications and as far as efficacy and then just normal life circumstances are ineffective.  She knows that some of it is situational.  Energy and motivation are good enough for her to do the things she is responsible for.  But that is about all she can do.  Does not have time to do much for fun.  Sleeps well by taking the Ambien.  Denies suicidal or homicidal thoughts.  Patient denies increased energy with decreased need for sleep, no increased talkativeness, no racing thoughts, no impulsivity or risky behaviors, no increased spending, no increased libido, no grandiosity.  Denies dizziness, syncope, seizures, numbness, tingling, tremor, tics, unsteady gait, slurred speech, confusion. Denies muscle or joint pain, stiffness, or dystonia.  Individual Medical History/ Review of Systems: Changes? :Yes  had incisional hernia repair on 10/28/19  Past medications for mental health diagnoses include: Chantix, Lamictal, BuSpar, symbyax, Zyprexa, Xanax, Latuda, Effexor, Sonata, Ambien, Prozac, Librium, Wellbutrin, Adderall XR, Cymbalta, Hydroxyzine, Klonopin, Zoloft  Allergies: Betadine [povidone iodine], Amlodipine, and Hydrocodone  Current Medications:  Current Outpatient Medications:  .  albuterol (PROVENTIL HFA;VENTOLIN HFA) 108 (90 BASE) MCG/ACT inhaler, Inhale 2 puffs into the lungs every 6 (six) hours as needed. For ashtma, Disp: , Rfl:  .  [  START ON 12/17/2019] amphetamine-dextroamphetamine (ADDERALL XR) 25 MG 24 hr capsule, Take 1 capsule by mouth daily after lunch., Disp: 30 capsule, Rfl: 0 .  [START ON 01/16/2020] amphetamine-dextroamphetamine (ADDERALL XR) 25 MG 24 hr capsule, Take 1 capsule by mouth daily at 12 noon., Disp: 30  capsule, Rfl: 0 .  [START ON 02/14/2020] amphetamine-dextroamphetamine (ADDERALL XR) 25 MG 24 hr capsule, Take 1 capsule by mouth daily at 12 noon., Disp: 30 capsule, Rfl: 0 .  [START ON 12/02/2019] amphetamine-dextroamphetamine (ADDERALL XR) 30 MG 24 hr capsule, Take 1 capsule (30 mg total) by mouth daily., Disp: 30 capsule, Rfl: 0 .  [START ON 01/01/2020] amphetamine-dextroamphetamine (ADDERALL XR) 30 MG 24 hr capsule, Take 1 capsule (30 mg total) by mouth daily., Disp: 30 capsule, Rfl: 0 .  [START ON 01/30/2020] amphetamine-dextroamphetamine (ADDERALL XR) 30 MG 24 hr capsule, Take 1 capsule (30 mg total) by mouth daily., Disp: 30 capsule, Rfl: 0 .  budesonide-formoterol (SYMBICORT) 80-4.5 MCG/ACT inhaler, Inhale 2 puffs into the lungs 2 (two) times daily., Disp: , Rfl:  .  busPIRone (BUSPAR) 30 MG tablet, TAKE 1 TABLET(30 MG) BY MOUTH THREE TIMES DAILY, Disp: 90 tablet, Rfl: 1 .  clonazePAM (KLONOPIN) 0.5 MG tablet, TAKE 1 TABLET(0.5 MG) BY MOUTH DAILY AS NEEDED FOR ANXIETY, Disp: 30 tablet, Rfl: 5 .  Dulaglutide (TRULICITY) 0.75 MG/0.5ML SOPN, ADMINISTER 0.75 MG UNDER THE SKIN 1 TIME A WEEK, Disp: , Rfl:  .  fexofenadine (ALLEGRA) 180 MG tablet, Take 180 mg by mouth daily. , Disp: , Rfl:  .  FLUoxetine (PROZAC) 40 MG capsule, TAKE 2 CAPSULES(80 MG) BY MOUTH DAILY, Disp: 180 capsule, Rfl: 1 .  Fluticasone Propionate (FLONASE NA), Place into the nose., Disp: , Rfl:  .  gabapentin (NEURONTIN) 800 MG tablet, 800 mg. Qid, Disp: , Rfl:  .  lamoTRIgine (LAMICTAL) 200 MG tablet, TAKE 1 TABLET BY MOUTH TWICE DAILY, Disp: 180 tablet, Rfl: 0 .  lisinopril (ZESTRIL) 5 MG tablet, Take 4 tablets (20 mg total) by mouth daily., Disp: 30 tablet, Rfl: 0 .  metFORMIN (GLUCOPHAGE) 500 MG tablet, Take 500 mg by mouth 2 (two) times daily with a meal., Disp: , Rfl:  .  OLANZapine (ZYPREXA) 15 MG tablet, Take 1 tablet (15 mg total) by mouth at bedtime., Disp: 90 tablet, Rfl: 1 .  omega-3 acid ethyl esters (LOVAZA) 1 g  capsule, Take 1 capsule by mouth daily., Disp: , Rfl:  .  ondansetron (ZOFRAN) 4 MG tablet, Take by mouth., Disp: , Rfl:  .  pantoprazole (PROTONIX) 40 MG tablet, Take by mouth., Disp: , Rfl:  .  potassium chloride (K-DUR) 10 MEQ tablet, TAKE 1 TABLET(10 MEQ) BY MOUTH EVERY DAY, Disp: , Rfl:  .  promethazine (PHENERGAN) 25 MG tablet, TAKE 1 TABLET BY MOUTH EVERY 4 HOURS AS NEEDED FOR NAUSEA, Disp: , Rfl:  .  varenicline (CHANTIX) 1 MG tablet, TAKE 1 TABLET BY MOUTH EVERY 12 HOURS, Disp: 56 tablet, Rfl: 2 .  zolpidem (AMBIEN CR) 12.5 MG CR tablet, TAKE 1 TABLET(12.5 MG) BY MOUTH AT BEDTIME AS NEEDED FOR SLEEP, Disp: 30 tablet, Rfl: 5 .  guaiFENesin-codeine 100-10 MG/5ML syrup, Take 5 mLs by mouth every 4 (four) hours as needed for cough. (Patient not taking: Reported on 10/03/2018), Disp: 120 mL, Rfl: 0 .  linagliptin (TRADJENTA) 5 MG TABS tablet, TAKE 1 TABLET(5 MG) BY MOUTH EVERY DAY, Disp: , Rfl:  .  LINZESS 145 MCG CAPS capsule, Take 145 mcg by mouth daily., Disp: , Rfl:  .  methocarbamol (ROBAXIN) 750 MG tablet, Take 750 mg by mouth 3 (three) times daily., Disp: , Rfl:  .  tiZANidine (ZANAFLEX) 4 MG tablet, Take 4 mg by mouth 3 (three) times daily., Disp: , Rfl:  Medication Side Effects: none  Family Medical/ Social History: Changes? No  MENTAL HEALTH EXAM:  There were no vitals taken for this visit.There is no height or weight on file to calculate BMI.  General Appearance: unable to assess  Eye Contact:  unable to assess  Speech:  Clear and Coherent and Normal Rate  Volume:  Normal  Mood:  Euthymic  Affect:  unable to assess  Thought Process:  Goal Directed and Descriptions of Associations: Intact  Orientation:  Full (Time, Place, and Person)  Thought Content: Logical   Suicidal Thoughts:  No  Homicidal Thoughts:  No  Memory:  WNL  Judgement:  Good  Insight:  Good  Psychomotor Activity:  unable to assess  Concentration:  Concentration: Good and Attention Span: Good  Recall:   Good  Fund of Knowledge: Good  Language: Good  Assets:  Desire for Improvement  ADL's:  Intact  Cognition: WNL  Prognosis:  Good    DIAGNOSES:    ICD-10-CM   1. Generalized anxiety disorder  F41.1   2. Reactive depression  F32.9   3. Attention deficit hyperactivity disorder (ADHD), predominantly inattentive type  F90.0   4. Smoker  F17.200   5. Polypharmacy  Z79.899     Receiving Psychotherapy: No    RECOMMENDATIONS:  PDMP was reviewed. I spent 25 minutes with her. We discussed the fact that the anxiety could be better managed with benzodiazepines if she was not on the Adderall.  There are other treatments such as Strattera, Intuniv, Kapvay, and behavioral treatments that can also help.  She states this is the only thing that works.  We have discussed this before and I will not prescribe more benzos while she is on a stimulant.  She understands. I am proud of her for decreasing the quantity of cigarettes she is smoking.  She is still working on quitting. Continue Adderall XR 30 mg every morning. Continue Adderall XR 25 mg daily at lunch. Continue BuSpar 30 mg 1 p.o. 3 times daily. Continue Klonopin 0.5 mg 1 p.o. daily as needed. Continue Prozac 40 mg, 2 p.o. daily. Continue gabapentin 800 mg, 1 p.o. 4 times daily. Continue Lamictal 200 mg, 1 p.o. twice daily. Continue Zyprexa's 15 mg, nightly. Continue Chantix as directed. Continue Ambien CR 12.5 mg p.o. nightly as needed sleep. Return in 6 months.   Melony Overly, PA-C

## 2019-12-03 ENCOUNTER — Other Ambulatory Visit: Payer: Self-pay | Admitting: Physician Assistant

## 2019-12-06 ENCOUNTER — Ambulatory Visit: Payer: Self-pay | Attending: Internal Medicine

## 2019-12-06 ENCOUNTER — Other Ambulatory Visit: Payer: Self-pay

## 2019-12-06 DIAGNOSIS — Z23 Encounter for immunization: Secondary | ICD-10-CM

## 2019-12-06 NOTE — Progress Notes (Signed)
   Covid-19 Vaccination Clinic  Name:  PAXTON KANAAN    MRN: [id number] DOB: [birth year]  12/06/2019  Ms. Masullo was observed post Covid-19 immunization for 15 minutes without incident. She was provided with Vaccine Information Sheet and instruction to access the V-Safe system.   Ms. Sammons was instructed to call 911 with any severe reactions post vaccine: Marland Kitchen Difficulty breathing  . Swelling of face and throat  . A fast heartbeat  . A bad rash all over body  . Dizziness and weakness   Immunizations Administered    Name Date Dose VIS Date Route   Pfizer COVID-19 Vaccine 12/06/2019 12:19 PM 0.3 mL 09/05/2019 Intramuscular   Manufacturer: [employer], [employer]   Lot: [id number]   NDC: [address]-8

## 2019-12-07 ENCOUNTER — Other Ambulatory Visit: Payer: Self-pay | Admitting: Physician Assistant

## 2019-12-27 ENCOUNTER — Other Ambulatory Visit: Payer: Self-pay | Admitting: Physician Assistant

## 2019-12-31 ENCOUNTER — Ambulatory Visit: Payer: BC Managed Care – PPO | Attending: Internal Medicine

## 2019-12-31 DIAGNOSIS — Z23 Encounter for immunization: Secondary | ICD-10-CM

## 2019-12-31 NOTE — Progress Notes (Signed)
   Covid-19 Vaccination Clinic  Name:  Katrina Booker    MRN: [id number] DOB: 1976-07-14  12/31/2019  Katrina Booker was observed post Covid-19 immunization for 15 minutes without incident. She was provided with Vaccine Information Sheet and instruction to access the V-Safe system.   Katrina Booker was instructed to call 911 with any severe reactions post vaccine: Marland Kitchen Difficulty breathing  . Swelling of face and throat  . A fast heartbeat  . A bad rash all over body  . Dizziness and weakness   Immunizations Administered    Name Date Dose VIS Date Route   Pfizer COVID-19 Vaccine 12/31/2019  4:15 PM 0.3 mL 09/05/2019 Intramuscular   Manufacturer: [employer], [employer]   Lot: [id number]   NDC: [address]-9

## 2020-02-12 ENCOUNTER — Other Ambulatory Visit: Payer: Self-pay | Admitting: Physician Assistant

## 2020-02-24 ENCOUNTER — Other Ambulatory Visit: Payer: Self-pay

## 2020-02-24 ENCOUNTER — Telehealth: Payer: Self-pay | Admitting: Physician Assistant

## 2020-02-24 MED ORDER — AMPHETAMINE-DEXTROAMPHET ER 30 MG PO CP24: 30.0000 mg | ORAL_CAPSULE | Freq: Every day | ORAL | 0 refills | Status: DC

## 2020-02-24 MED ORDER — AMPHETAMINE-DEXTROAMPHET ER 25 MG PO CP24: 25.0000 mg | ORAL_CAPSULE | Freq: Every day | ORAL | 0 refills | Status: DC

## 2020-02-24 NOTE — Telephone Encounter (Signed)
Requesting refill on both Adderall's. Please send to [facility] In [place], [place] [address]

## 2020-02-25 NOTE — Telephone Encounter (Signed)
Rxs sent

## 2020-05-03 IMAGING — MG DIGITAL SCREENING BILATERAL MAMMOGRAM WITH TOMO AND CAD
8 series · 8 of 24 positions shown · non-contrast
Comparison: Previous exam(s).

CLINICAL DATA: Screening.

EXAM:
DIGITAL SCREENING BILATERAL MAMMOGRAM WITH TOMO AND CAD

[L CC synth-2D]
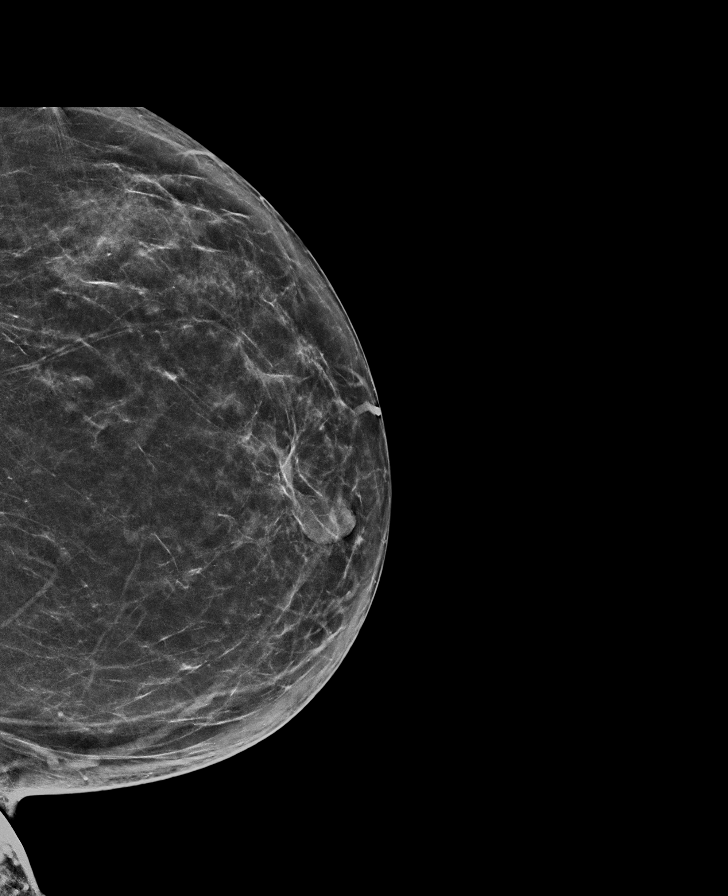

[L MLO synth-2D]
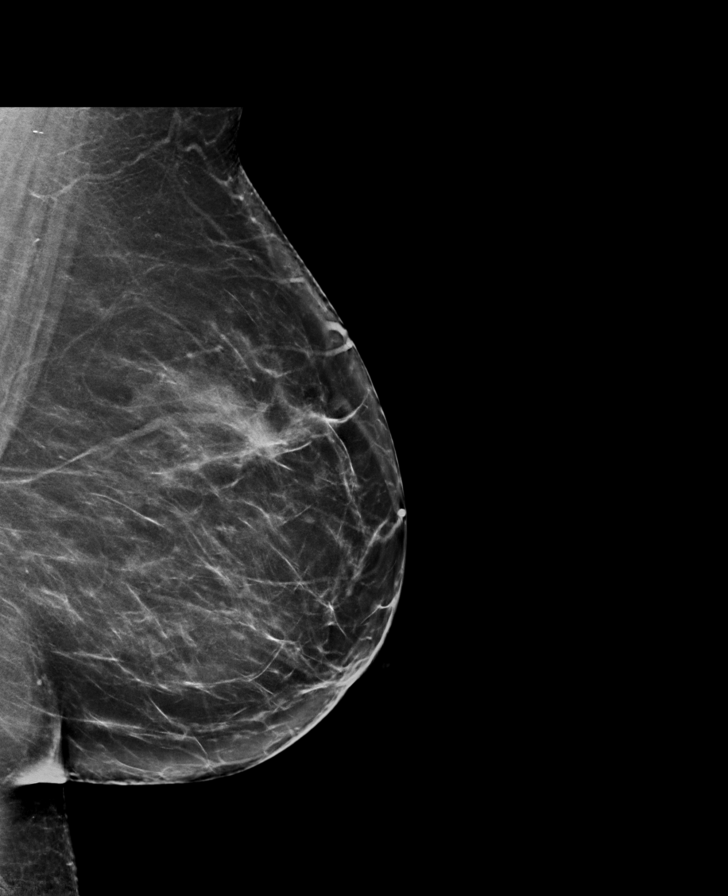

[R MLO synth-2D]
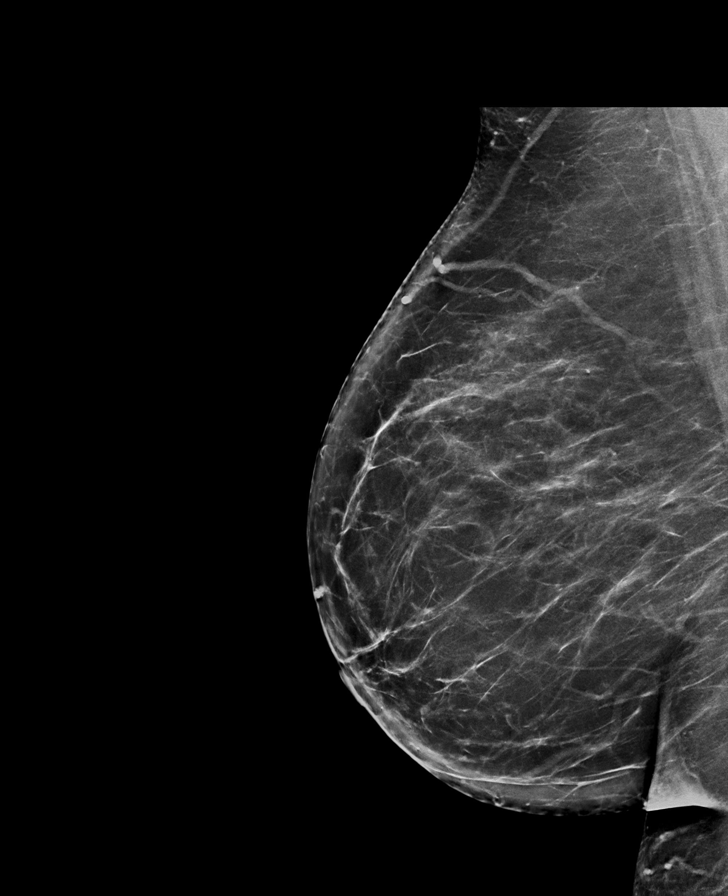

[R CC synth-2D]
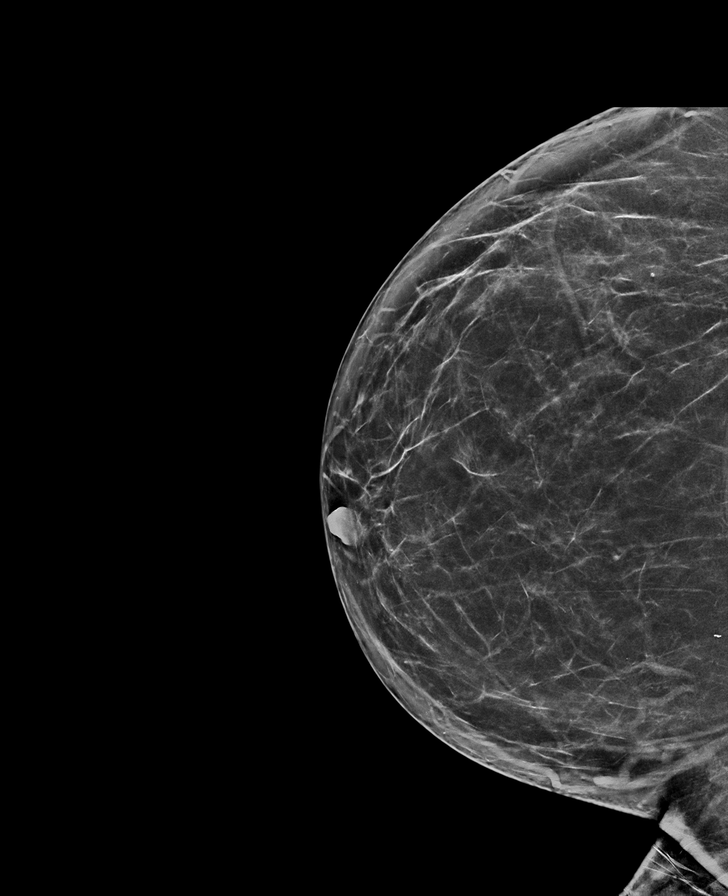

[L MLO tomo · tomo slice 43/86.0]
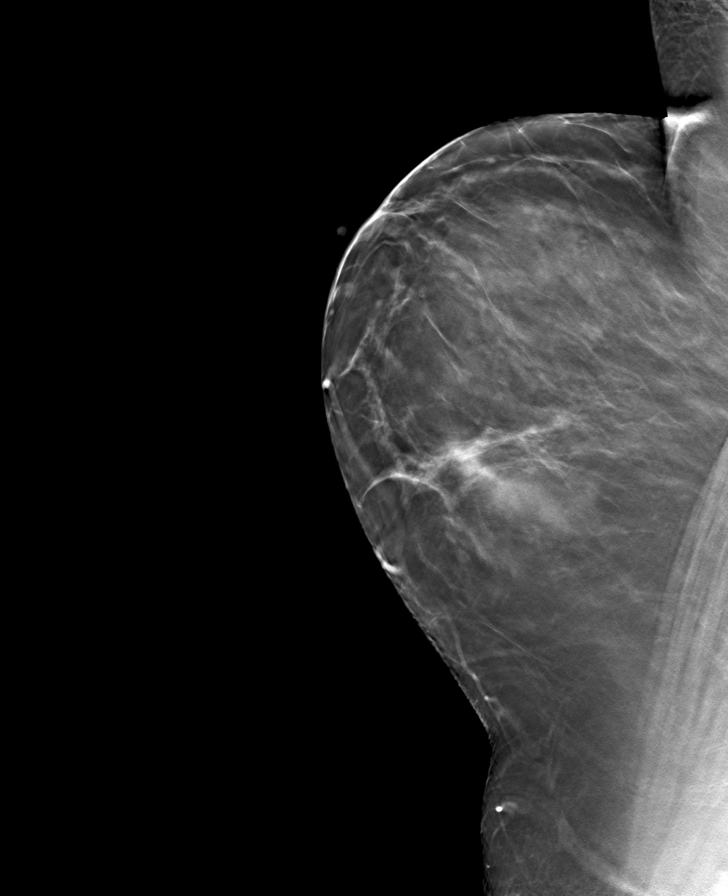

[R CC tomo · tomo slice 39/77.0]
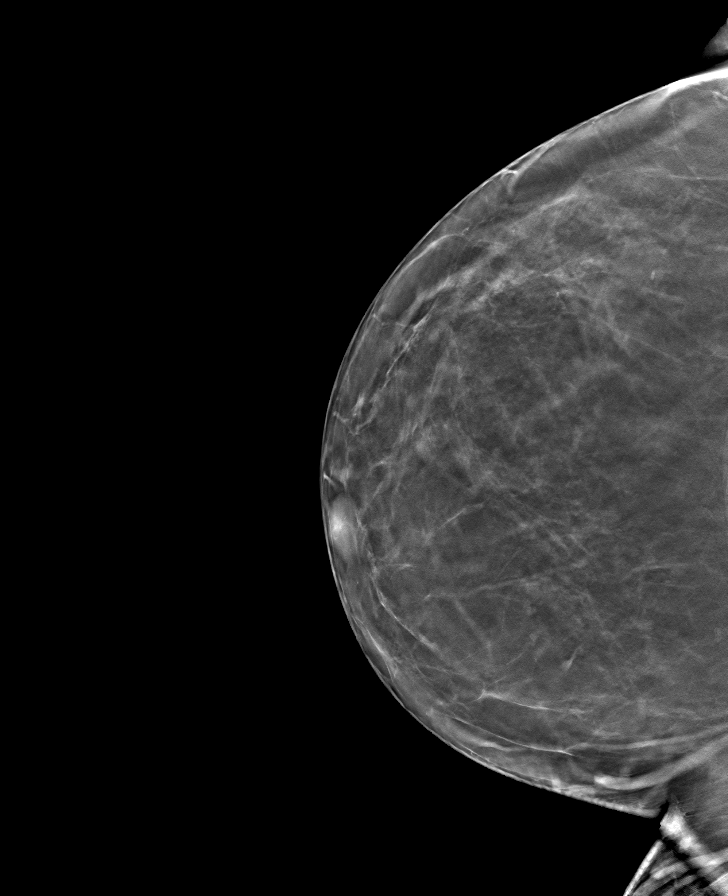

[L CC tomo · tomo slice 37/73.0]
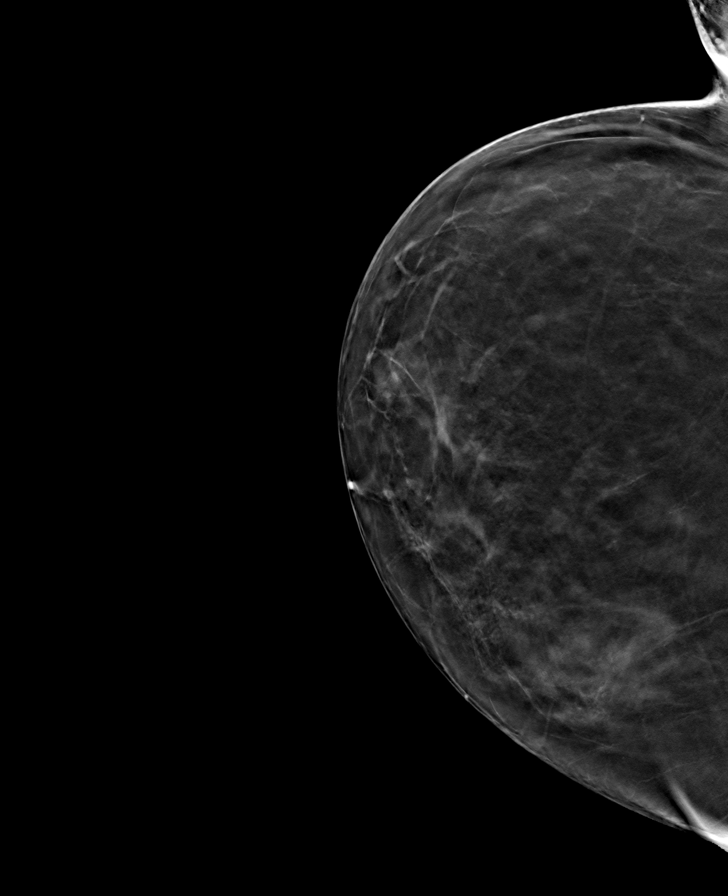

[R MLO tomo · tomo slice 43/86.0]
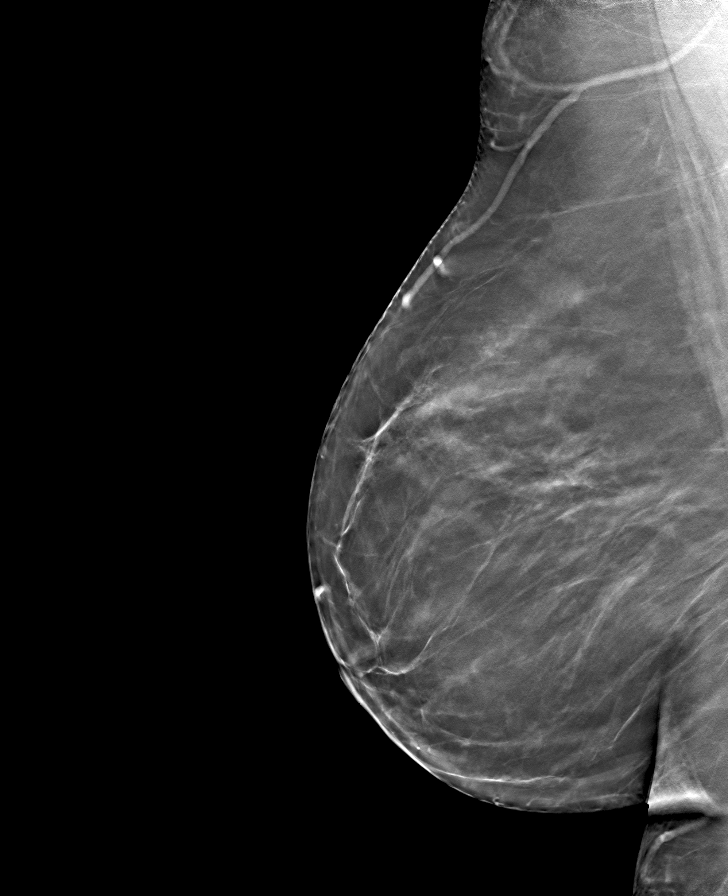

[8 of 24 positions shown; findings below may reference images not displayed]

ACR Breast Density Category b: There are scattered areas of
fibroglandular density.
FINDINGS: There are no findings suspicious for malignancy. Images were
processed with CAD.
IMPRESSION: No mammographic evidence of malignancy. A result letter of this
screening mammogram will be mailed directly to the patient.

RECOMMENDATION:
Screening mammogram in one year. (Code:[id number])

BI-RADS CATEGORY  1: Negative.

## 2020-05-10 ENCOUNTER — Other Ambulatory Visit: Payer: Self-pay | Admitting: Physician Assistant

## 2020-05-20 ENCOUNTER — Telehealth: Payer: Self-pay | Admitting: Physician Assistant

## 2020-05-20 NOTE — Telephone Encounter (Signed)
Tried to reach pharmacy today with clarification on her quantity, still unable to reach anyone.

## 2020-05-20 NOTE — Telephone Encounter (Signed)
Her last refill was 04/21/2020 per PMP, not sure if that's all they had available. Will keeping trying to reach pharmacy

## 2020-05-20 NOTE — Telephone Encounter (Signed)
Pt called stating Zolpidem CR per pharmacy is for 8 pills. Pt usually get 30. Please check and advise Pt [phone].   Apt 9/27

## 2020-05-21 ENCOUNTER — Other Ambulatory Visit: Payer: Self-pay

## 2020-05-21 MED ORDER — ZOLPIDEM TARTRATE ER 12.5 MG PO TBCR: EXTENDED_RELEASE_TABLET | ORAL | 1 refills | Status: DC

## 2020-05-30 ENCOUNTER — Other Ambulatory Visit: Payer: Self-pay

## 2020-05-30 ENCOUNTER — Ambulatory Visit
Admission: EM | Admit: 2020-05-30 | Discharge: 2020-05-30 | Disposition: A | Discharge status: Home / Self Care | Payer: BC Managed Care – PPO | Attending: Internal Medicine | Admitting: Internal Medicine

## 2020-05-30 ENCOUNTER — Encounter: Payer: Self-pay | Admitting: Emergency Medicine

## 2020-05-30 DIAGNOSIS — U071 COVID-19: Secondary | ICD-10-CM | POA: Diagnosis present

## 2020-05-30 MED ORDER — BENZONATATE 100 MG PO CAPS: 100.0000 mg | ORAL_CAPSULE | Freq: Three times a day (TID) | ORAL | 0 refills | Status: DC

## 2020-05-30 NOTE — Discharge Instructions (Signed)
Please quarantine with family until COVID-19 results are available If you have worsening shortness of breath please return to the urgent care to be reevaluated.

## 2020-05-30 NOTE — ED Provider Notes (Addendum)
[facility]    CSN: [id number] Arrival date & time: 05/30/20  0932      History   Chief Complaint Chief Complaint  Patient presents with  . Fatigue  . Cough    HPI Katrina Booker is a 44 y.o. female comes to the urgent care with cough, body aches, fatigue which started yesterday.  Patient's father has been diagnosed with COVID-19 infection.  She has a cough which is nonproductive.  Mild shortness of breath with exertion.  No wheezing.  No nausea, vomiting or diarrhea.  She has lost her taste this morning.  Appetite is preserved.  No loss of smell.  No chest pain or chest pressure.  She has generalized body aches.   HPI  Past Medical History:  Diagnosis Date  . ADHD (attention deficit hyperactivity disorder)   . Asthma   . Bipolar 1 disorder (HCC)   . Diabetes mellitus   . GERD (gastroesophageal reflux disease)     Patient Active Problem List   Diagnosis Date Noted  . Hypotension 03/19/2019  . Syncope and collapse 03/19/2019  . Attention deficit hyperactivity disorder (ADHD) 07/24/2018  . Insomnia 07/24/2018    Past Surgical History:  Procedure Laterality Date  . ABDOMINAL HYSTERECTOMY    . ABDOMINAL SURGERY    . BACK SURGERY    . HERNIA REPAIR    . INCONTINENCE SURGERY    . OTHER SURGICAL HISTORY     tumor excision on face  . TUBAL LIGATION      OB History   No obstetric history on file.      Home Medications    Prior to Admission medications   Medication Sig Start Date End Date Taking? Authorizing Provider  albuterol (PROVENTIL HFA;VENTOLIN HFA) 108 (90 BASE) MCG/ACT inhaler Inhale 2 puffs into the lungs every 6 (six) hours as needed. For ashtma   Yes [provider]  amphetamine-dextroamphetamine (ADDERALL XR) 25 MG 24 hr capsule Take 1 capsule by mouth daily after lunch. 03/13/20  Yes Hurst, Glade Nurse, PA-C  budesonide-formoterol (SYMBICORT) 80-4.5 MCG/ACT inhaler Inhale 2 puffs into the lungs 2 (two) times daily.   Yes [provider]  busPIRone (BUSPAR) 30 MG tablet TAKE 1 TABLET(30 MG) BY MOUTH THREE TIMES DAILY 12/08/19  Yes Hurst, [name], PA-C  clonazePAM (KLONOPIN) 0.5 MG tablet TAKE 1 TABLET(0.5 MG) BY MOUTH DAILY AS NEEDED FOR ANXIETY 11/27/19  Yes Hurst, [name], PA-C  Dulaglutide (TRULICITY) 0.75 MG/0.5ML SOPN ADMINISTER 0.75 MG UNDER THE SKIN 1 TIME A WEEK 09/05/19  Yes [provider]  fexofenadine (ALLEGRA) 180 MG tablet Take 180 mg by mouth daily.  06/17/18  Yes [provider]  FLUoxetine (PROZAC) 40 MG capsule TAKE 2 CAPSULES(80 MG) BY MOUTH DAILY 11/27/19  Yes Hurst, [name], PA-C  Fluticasone Propionate (FLONASE NA) Place into the nose.   Yes [provider]  gabapentin (NEURONTIN) 800 MG tablet 800 mg. Qid 06/07/18  Yes [provider]  lamoTRIgine (LAMICTAL) 200 MG tablet TAKE 1 TABLET(200 MG) BY MOUTH TWICE DAILY 05/11/20  Yes Cherie Ouch, PA-C  LINZESS 145 MCG CAPS capsule Take 145 mcg by mouth daily. 11/08/19  Yes [provider]  lisinopril (ZESTRIL) 5 MG tablet Take 4 tablets (20 mg total) by mouth daily. 03/20/19  Yes Mody, Patricia Pesa, MD  metFORMIN (GLUCOPHAGE) 500 MG tablet Take 500 mg by mouth 2 (two) times daily with a meal.   Yes [provider]  OLANZapine (ZYPREXA) 15 MG tablet  TAKE 1 TABLET(15 MG) BY MOUTH AT BEDTIME 12/29/19  Yes Hurst, [name], PA-C  omega-3 acid ethyl esters (LOVAZA) 1 g capsule Take 1 capsule by mouth daily. 06/17/18  Yes [provider]  pantoprazole (PROTONIX) 40 MG tablet Take by mouth. 07/28/19  Yes [provider]  potassium chloride (K-DUR) 10 MEQ tablet TAKE 1 TABLET(10 MEQ) BY MOUTH EVERY DAY 06/07/18  Yes [provider]  varenicline (CHANTIX) 1 MG tablet TAKE 1 TABLET BY MOUTH EVERY 12 HOURS 09/24/19  Yes Hurst, [name], PA-C  zolpidem (AMBIEN CR) 12.5 MG CR tablet TAKE 1 TABLET(12.5 MG) BY MOUTH AT BEDTIME AS NEEDED FOR SLEEP 05/21/20  Yes Hurst, [name], PA-C    amphetamine-dextroamphetamine (ADDERALL XR) 25 MG 24 hr capsule Take 1 capsule by mouth daily at 12 noon. 04/10/20   Cherie Ouch, PA-C  amphetamine-dextroamphetamine (ADDERALL XR) 25 MG 24 hr capsule Take 1 capsule by mouth daily at 12 noon. 05/08/20   Cherie Ouch, PA-C  amphetamine-dextroamphetamine (ADDERALL XR) 30 MG 24 hr capsule Take 1 capsule (30 mg total) by mouth daily. 04/20/20   Cherie Ouch, PA-C  amphetamine-dextroamphetamine (ADDERALL XR) 30 MG 24 hr capsule Take 1 capsule (30 mg total) by mouth daily. 03/23/20   Cherie Ouch, PA-C  amphetamine-dextroamphetamine (ADDERALL XR) 30 MG 24 hr capsule Take 1 capsule (30 mg total) by mouth daily. 02/24/20   Melony Overly T, PA-C  benzonatate (TESSALON) 100 MG capsule Take 1 capsule (100 mg total) by mouth every 8 (eight) hours. 05/30/20   Merrilee Jansky, MD  ondansetron (ZOFRAN) 4 MG tablet Take by mouth. 06/17/18   [provider]  promethazine (PHENERGAN) 25 MG tablet TAKE 1 TABLET BY MOUTH EVERY 4 HOURS AS NEEDED FOR NAUSEA 06/07/18   [provider]  linagliptin (TRADJENTA) 5 MG TABS tablet TAKE 1 TABLET(5 MG) BY MOUTH EVERY DAY 07/23/18 05/30/20  [provider]    Family History Family History  Problem Relation Age of Onset  . Hypothyroidism Mother   . Diabetes Father   . Heart disease Father     Social History Social History   Tobacco Use  . Smoking status: Current Every Day Smoker    Packs/day: 0.20    Types: Cigarettes  . Smokeless tobacco: Never Used  Vaping Use  . Vaping Use: Never used  Substance Use Topics  . Alcohol use: No    Alcohol/week: 0.0 standard drinks  . Drug use: No     Allergies   Betadine [povidone iodine], Amlodipine, and Hydrocodone   Review of Systems Review of Systems  Constitutional: Negative for activity change, chills, fatigue and fever.  Musculoskeletal: Positive for arthralgias and myalgias. Negative for neck pain and neck stiffness.  Skin:  Negative.      Physical Exam Triage Vital Signs ED Triage Vitals  Enc Vitals Group     BP 05/30/20 1003 (!) 147/87     Pulse Rate 05/30/20 1003 78     Resp 05/30/20 1003 14     Temp 05/30/20 1003 98.2 F (36.8 C)     Temp Source 05/30/20 1003 Oral     SpO2 05/30/20 1003 99 %     Weight 05/30/20 1000 213 lb (96.6 kg)     Height 05/30/20 1000 5\' 3"  (1.6 m)     Head Circumference --      Peak Flow --      Pain Score 05/30/20 1000 3     Pain Loc --  Pain Edu? --      Excl. in GC? --    No data found.  Updated Vital Signs BP (!) 147/87 (BP Location: Left Arm)   Pulse 78   Temp 98.2 F (36.8 C) (Oral)   Resp 14   Ht 5\' 3"  (1.6 m)   Wt 96.6 kg   SpO2 99%   BMI 37.73 kg/m   Visual Acuity Right Eye Distance:   Left Eye Distance:   Bilateral Distance:    Right Eye Near:   Left Eye Near:    Bilateral Near:     Physical Exam Vitals and nursing note reviewed.  Constitutional:      General: She is not in acute distress.    Appearance: She is ill-appearing. She is not toxic-appearing.  HENT:     Mouth/Throat:     Mouth: Mucous membranes are moist.     Pharynx: Posterior oropharyngeal erythema present. No oropharyngeal exudate.  Cardiovascular:     Rate and Rhythm: Normal rate and regular rhythm.     Pulses: Normal pulses.  Pulmonary:     Effort: Pulmonary effort is normal.     Breath sounds: Normal breath sounds.  Musculoskeletal:        General: Normal range of motion.  Skin:    Capillary Refill: Capillary refill takes less than 2 seconds.  Neurological:     General: No focal deficit present.     Mental Status: She is alert.      UC Treatments / Results  Labs (all labs ordered are listed, but only abnormal results are displayed) Labs Reviewed  SARS CORONAVIRUS 2 (TAT 6-24 HRS)    EKG   Radiology No results found.  Procedures Procedures (including critical care time)  Medications Ordered in UC Medications - No data to display  Initial  Impression / Assessment and Plan / UC Course  I have reviewed the triage vital signs and the nursing notes.  Pertinent labs & imaging results that were available during my care of the patient were reviewed by me and considered in my medical decision making (see chart for details).     1.  COVID-19 infection: COVID-19 PCR sent Patient is advised to quarantine until COVID-19 test results are available Tessalon Perles as needed for cough Return if symptoms worsen Consider getting vaccinated against COVID-19 virus after you recover from this current infection. Final Clinical Impressions(s) / UC Diagnoses   Final diagnoses:  COVID-19 virus infection     Discharge Instructions     Please quarantine with family until COVID-19 results are available If you have worsening shortness of breath please return to the urgent care to be reevaluated.   ED Prescriptions    Medication Sig Dispense Auth. Provider   benzonatate (TESSALON) 100 MG capsule Take 1 capsule (100 mg total) by mouth every 8 (eight) hours. 21 capsule Keshaun Dubey, , MD     PDMP not reviewed this encounter.   Britta Mccreedy, MD 05/31/20 1546    07/31/20, MD 05/31/20 [phone]

## 2020-05-30 NOTE — ED Triage Notes (Signed)
Patient c/o cough, congestion, bodyaches, and fatigue that started yesterday.  Patient denies fevers.

## 2020-05-31 LAB — SARS CORONAVIRUS 2 (TAT 6-24 HRS): SARS Coronavirus 2: NEGATIVE

## 2020-06-01 ENCOUNTER — Telehealth: Payer: Self-pay | Admitting: Physician Assistant

## 2020-06-01 ENCOUNTER — Other Ambulatory Visit: Payer: Self-pay

## 2020-06-01 MED ORDER — AMPHETAMINE-DEXTROAMPHET ER 30 MG PO CP24: 30.0000 mg | ORAL_CAPSULE | Freq: Every day | ORAL | 0 refills | Status: DC

## 2020-06-01 MED ORDER — AMPHETAMINE-DEXTROAMPHET ER 25 MG PO CP24: 25.0000 mg | ORAL_CAPSULE | Freq: Every day | ORAL | 0 refills | Status: DC

## 2020-06-01 NOTE — Telephone Encounter (Signed)
Patient requesting the Adderall 25 mg now, the 30 mg is due later. Both are pended for Katrina Booker to send.  Last refill 25 mg 05/03/2020 Last refill 30 mg 05/18/2020

## 2020-06-01 NOTE — Telephone Encounter (Signed)
Pt LM on VM requesting refill for Adderall XR 30 mg . Apt 9/27

## 2020-06-01 NOTE — Telephone Encounter (Signed)
Last refill 05/18/2020 Adderall XR 30 mg

## 2020-06-03 ENCOUNTER — Other Ambulatory Visit: Payer: Self-pay | Admitting: Physician Assistant

## 2020-06-03 NOTE — Telephone Encounter (Signed)
Please review

## 2020-06-09 ENCOUNTER — Other Ambulatory Visit: Payer: Self-pay

## 2020-06-09 ENCOUNTER — Ambulatory Visit (INDEPENDENT_AMBULATORY_CARE_PROVIDER_SITE_OTHER): Payer: Medicare Other

## 2020-06-09 ENCOUNTER — Ambulatory Visit (INDEPENDENT_AMBULATORY_CARE_PROVIDER_SITE_OTHER): Payer: Medicare Other | Admitting: Podiatry

## 2020-06-09 ENCOUNTER — Encounter: Payer: Self-pay | Admitting: Podiatry

## 2020-06-09 DIAGNOSIS — M2042 Other hammer toe(s) (acquired), left foot: Secondary | ICD-10-CM | POA: Diagnosis not present

## 2020-06-09 DIAGNOSIS — M722 Plantar fascial fibromatosis: Secondary | ICD-10-CM

## 2020-06-09 DIAGNOSIS — S99911A Unspecified injury of right ankle, initial encounter: Secondary | ICD-10-CM

## 2020-06-09 DIAGNOSIS — D649 Anemia, unspecified: Secondary | ICD-10-CM | POA: Insufficient documentation

## 2020-06-09 DIAGNOSIS — M2041 Other hammer toe(s) (acquired), right foot: Secondary | ICD-10-CM

## 2020-06-09 MED ORDER — MELOXICAM 15 MG PO TABS: 15.0000 mg | ORAL_TABLET | Freq: Every day | ORAL | 3 refills | Status: DC

## 2020-06-09 NOTE — Patient Instructions (Signed)

## 2020-06-09 NOTE — Progress Notes (Signed)
Subjective:  Patient ID: Pryor Curia, female    DOB: July 30, 1976,  MRN: [id number] HPI Chief Complaint  Patient presents with  . Ankle Injury    Lateral ankle right - rolled ankle x 6 months ago, swollen and feeling electrical like sensations, no treatment  . Foot Pain    Plantar heel bilateral - aching x several months, AM pain, puts on crocs first thing in the morning, tried "special sketchers", was taking naproxen but no help  . Toe Pain    2nd toes bilateral - hammertoes  . Diabetes    Last a1c was 6.3  . New Patient (Initial Visit)    44 y.o. female presents with the above complaint.   ROS: Denies fever chills nausea vomiting muscle aches pains calf pain back pain chest pain shortness of breath.  Past Medical History:  Diagnosis Date  . ADHD (attention deficit hyperactivity disorder)   . Asthma   . Bipolar 1 disorder (HCC)   . Diabetes mellitus   . GERD (gastroesophageal reflux disease)    Past Surgical History:  Procedure Laterality Date  . ABDOMINAL HYSTERECTOMY    . ABDOMINAL SURGERY    . BACK SURGERY    . HERNIA REPAIR    . INCONTINENCE SURGERY    . OTHER SURGICAL HISTORY     tumor excision on face  . TUBAL LIGATION      Current Outpatient Medications:  .  albuterol (PROVENTIL HFA;VENTOLIN HFA) 108 (90 BASE) MCG/ACT inhaler, Inhale 2 puffs into the lungs every 6 (six) hours as needed. For ashtma, Disp: , Rfl:  .  amphetamine-dextroamphetamine (ADDERALL XR) 25 MG 24 hr capsule, Take 1 capsule by mouth daily after lunch., Disp: 30 capsule, Rfl: 0 .  amphetamine-dextroamphetamine (ADDERALL XR) 25 MG 24 hr capsule, Take 1 capsule by mouth daily at 12 noon., Disp: 30 capsule, Rfl: 0 .  amphetamine-dextroamphetamine (ADDERALL XR) 30 MG 24 hr capsule, Take 1 capsule (30 mg total) by mouth daily., Disp: 30 capsule, Rfl: 0 .  [START ON 06/13/2020] amphetamine-dextroamphetamine (ADDERALL XR) 30 MG 24 hr capsule, Take 1 capsule (30 mg total) by mouth daily., Disp: 30  capsule, Rfl: 0 .  budesonide-formoterol (SYMBICORT) 80-4.5 MCG/ACT inhaler, Inhale 2 puffs into the lungs 2 (two) times daily., Disp: , Rfl:  .  busPIRone (BUSPAR) 30 MG tablet, TAKE 1 TABLET(30 MG) BY MOUTH THREE TIMES DAILY, Disp: 90 tablet, Rfl: 5 .  clonazePAM (KLONOPIN) 0.5 MG tablet, TAKE 1 TABLET(0.5 MG) BY MOUTH DAILY AS NEEDED FOR ANXIETY, Disp: 30 tablet, Rfl: 5 .  Dulaglutide (TRULICITY) 0.75 MG/0.5ML SOPN, ADMINISTER 0.75 MG UNDER THE SKIN 1 TIME A WEEK, Disp: , Rfl:  .  fexofenadine (ALLEGRA) 180 MG tablet, Take 180 mg by mouth daily. , Disp: , Rfl:  .  FLUoxetine (PROZAC) 40 MG capsule, TAKE 2 CAPSULES(80 MG) BY MOUTH DAILY, Disp: 180 capsule, Rfl: 1 .  Fluticasone Propionate (FLONASE NA), Place into the nose., Disp: , Rfl:  .  gabapentin (NEURONTIN) 800 MG tablet, 800 mg. Qid, Disp: , Rfl:  .  lamoTRIgine (LAMICTAL) 200 MG tablet, TAKE 1 TABLET(200 MG) BY MOUTH TWICE DAILY, Disp: 180 tablet, Rfl: 1 .  lidocaine (LIDODERM) 5 %, 2 patches daily., Disp: , Rfl:  .  LINZESS 145 MCG CAPS capsule, Take 145 mcg by mouth daily., Disp: , Rfl:  .  lisinopril-hydrochlorothiazide (ZESTORETIC) 20-12.5 MG tablet, Take 1 tablet by mouth every morning., Disp: , Rfl:  .  meloxicam (MOBIC) 15 MG tablet, Take 1  tablet (15 mg total) by mouth daily., Disp: 30 tablet, Rfl: 3 .  metFORMIN (GLUCOPHAGE) 500 MG tablet, Take 500 mg by mouth 2 (two) times daily with a meal., Disp: , Rfl:  .  OLANZapine (ZYPREXA) 15 MG tablet, TAKE 1 TABLET(15 MG) BY MOUTH AT BEDTIME, Disp: 30 tablet, Rfl: 5 .  omega-3 acid ethyl esters (LOVAZA) 1 g capsule, Take 1 capsule by mouth daily., Disp: , Rfl:  .  ondansetron (ZOFRAN) 4 MG tablet, Take by mouth., Disp: , Rfl:  .  pantoprazole (PROTONIX) 40 MG tablet, Take by mouth., Disp: , Rfl:  .  potassium chloride (K-DUR) 10 MEQ tablet, TAKE 1 TABLET(10 MEQ) BY MOUTH EVERY DAY, Disp: , Rfl:  .  promethazine (PHENERGAN) 25 MG tablet, TAKE 1 TABLET BY MOUTH EVERY 4 HOURS AS NEEDED  FOR NAUSEA, Disp: , Rfl:  .  terbinafine (LAMISIL) 250 MG tablet, Take 250 mg by mouth daily., Disp: , Rfl:  .  varenicline (CHANTIX) 1 MG tablet, TAKE 1 TABLET BY MOUTH EVERY 12 HOURS, Disp: 56 tablet, Rfl: 2 .  XIIDRA 5 % SOLN, , Disp: , Rfl:  .  zolpidem (AMBIEN CR) 12.5 MG CR tablet, TAKE 1 TABLET(12.5 MG) BY MOUTH AT BEDTIME AS NEEDED FOR SLEEP, Disp: 30 tablet, Rfl: 1  Allergies  Allergen Reactions  . Betadine [Povidone Iodine] Rash  . Amlodipine Nausea And Vomiting  . Hydrocodone Itching    hives  . Tramadol Hives, Itching and Rash   Review of Systems Objective:  There were no vitals filed for this visit.  General: Well developed, nourished, in no acute distress, alert and oriented x3   Dermatological: Skin is warm, dry and supple bilateral. Nails x 10 are well maintained; remaining integument appears unremarkable at this time. There are no open sores, no preulcerative lesions, no rash or signs of infection present.  Vascular: Dorsalis Pedis artery and Posterior Tibial artery pedal pulses are 2/4 bilateral with immedate capillary fill time. Pedal hair growth present. No varicosities and no lower extremity edema present bilateral.   Neruologic: Grossly intact via light touch bilateral. Vibratory intact via tuning fork bilateral. Protective threshold with Semmes Wienstein monofilament intact to all pedal sites bilateral. Patellar and Achilles deep tendon reflexes 2+ bilateral. No Babinski or clonus noted bilateral.   Musculoskeletal: No gross boney pedal deformities bilateral. No pain, crepitus, or limitation noted with foot and ankle range of motion bilateral. Muscular strength 5/5 in all groups tested bilateral.  Gait: Unassisted, Nonantalgic.    Radiographs:  Radiographs taken today demonstrate an osseously mature foot right.  Similarly left.  Soft tissue increase in density plantar fascial kidney insertion sites no other significant abnormalities other than mild flexible  hammertoes visible on lateral view.  Assessment & Plan:   Assessment: Ankle sprain right.  Grade 2.  Plan fasciitis right  Plan fasciitis left  Hammertoe second bilateral  Plan: Discussed etiology pathology conservative surgical therapies at this point time went ahead and injected her bilateral heels 20 mg Kenalog started her on meloxicam.  Placed on plantar fascial braces night splint.  I may need to consider Medrol next visit.  We discussed appropriate shoe gear stretching exercise ice therapy sugar modifications.  We will consider hammertoe repair as long as we make sure that we do not have to go to surgery for her letter fasciitis.  If her right ankle is not improved an MRI will be necessary.     Jarrad Mclees T. [place], [place]

## 2020-06-14 ENCOUNTER — Other Ambulatory Visit: Payer: Self-pay | Admitting: Physician Assistant

## 2020-06-15 ENCOUNTER — Other Ambulatory Visit: Payer: Self-pay | Admitting: Physician Assistant

## 2020-06-15 NOTE — Telephone Encounter (Signed)
review 

## 2020-06-21 ENCOUNTER — Telehealth: Payer: Self-pay | Admitting: Physician Assistant

## 2020-06-21 ENCOUNTER — Telehealth (INDEPENDENT_AMBULATORY_CARE_PROVIDER_SITE_OTHER): Payer: BC Managed Care – PPO | Admitting: Physician Assistant

## 2020-06-21 ENCOUNTER — Encounter: Payer: Self-pay | Admitting: Physician Assistant

## 2020-06-21 DIAGNOSIS — F17200 Nicotine dependence, unspecified, uncomplicated: Secondary | ICD-10-CM | POA: Diagnosis not present

## 2020-06-21 DIAGNOSIS — F411 Generalized anxiety disorder: Secondary | ICD-10-CM

## 2020-06-21 DIAGNOSIS — F331 Major depressive disorder, recurrent, moderate: Secondary | ICD-10-CM

## 2020-06-21 DIAGNOSIS — F900 Attention-deficit hyperactivity disorder, predominantly inattentive type: Secondary | ICD-10-CM | POA: Diagnosis not present

## 2020-06-21 DIAGNOSIS — F4321 Adjustment disorder with depressed mood: Secondary | ICD-10-CM

## 2020-06-21 MED ORDER — AMPHETAMINE-DEXTROAMPHET ER 30 MG PO CP24: 30.0000 mg | ORAL_CAPSULE | Freq: Every day | ORAL | 0 refills | Status: DC

## 2020-06-21 MED ORDER — AMPHETAMINE-DEXTROAMPHET ER 25 MG PO CP24: 25.0000 mg | ORAL_CAPSULE | Freq: Every day | ORAL | 0 refills | Status: DC

## 2020-06-21 MED ORDER — ZOLPIDEM TARTRATE ER 12.5 MG PO TBCR: EXTENDED_RELEASE_TABLET | ORAL | 5 refills | Status: DC

## 2020-06-21 MED ORDER — FLUOXETINE HCL 40 MG PO CAPS: ORAL_CAPSULE | ORAL | 5 refills | Status: DC

## 2020-06-21 MED ORDER — AMPHETAMINE-DEXTROAMPHET ER 25 MG PO CP24: 25.0000 mg | ORAL_CAPSULE | ORAL | 0 refills | Status: DC

## 2020-06-21 MED ORDER — CLONAZEPAM 0.5 MG PO TABS: 0.5000 mg | ORAL_TABLET | Freq: Every day | ORAL | 5 refills | Status: DC | PRN

## 2020-06-21 NOTE — Telephone Encounter (Signed)
Ms. Katrina Booker, Katrina Booker are scheduled for a virtual visit with your provider today.    Just as we do with appointments in the office, we must obtain your consent to participate.  Your consent will be active for this visit and any virtual visit you may have with one of our providers in the next 365 days.    If you have a MyChart account, I can also send a copy of this consent to you electronically.  All virtual visits are billed to your insurance company just like a traditional visit in the office.  As this is a virtual visit, video technology does not allow for your provider to perform a traditional examination.  This may limit your provider's ability to fully assess your condition.  If your provider identifies any concerns that need to be evaluated in person or the need to arrange testing such as labs, EKG, etc, we will make arrangements to do so.    Although advances in technology are sophisticated, we cannot ensure that it will always work on either your end or our end.  If the connection with a video visit is poor, we may have to switch to a telephone visit.  With either a video or telephone visit, we are not always able to ensure that we have a secure connection.   I need to obtain your verbal consent now.   Are you willing to proceed with your visit today?   Katrina Booker has provided verbal consent on 06/21/2020 for a virtual visit (video or telephone).   Melony Overly, PA-C 06/21/2020  5:16 PM

## 2020-06-21 NOTE — Progress Notes (Signed)
[facility]  Patient ID: Katrina Booker,  MRN: [id number]  PCP: Dorothey Baseman, MD  Date of Evaluation: 06/21/2020 Time spent:30 minutes  Chief Complaint:  Chief Complaint    Anxiety; Depression; ADD     Virtual Visit via Telehealth  I connected with patient by a video enabled telemedicine application with their informed consent, and verified patient privacy and that I am speaking with the correct person using two identifiers.  I am private, in my office and the patient is at home.  I discussed the limitations, risks, security and privacy concerns of performing an evaluation and management service by video and the availability of in person appointments. I also discussed with the patient that there may be a patient responsible charge related to this service. The patient expressed understanding and agreed to proceed.   I discussed the assessment and treatment plan with the patient. The patient was provided an opportunity to ask questions and all were answered. The patient agreed with the plan and demonstrated an understanding of the instructions.   The patient was advised to call back or seek an in-person evaluation if the symptoms worsen or if the condition fails to improve as anticipated.  I provided 30 minutes of non-face-to-face time during this encounter.   HISTORY/CURRENT STATUS: HPI for routine med check.  She has been under a lot of stress the past couple of months.  Her husband's father died suddenly on Jun 04, 2020.  Then, her mom who has been sick for quite a while and died on 2023-06-08.  It has been overwhelming at times.  "I know a pill cannot fix what I am going through.  I just have to have these feelings."  Due to the increased stress, she has increased the amount she is smoking.  She is still taking the Chantix and feels that it does help some.  It is been hard to sleep.  She has been more anxious sometimes more so than others.  She feels like her meds  are working though.  Is able to enjoy things at times.  Other times, the grief will be overwhelming.  Energy and motivation are good most days.  Appetite is normal.  She is not isolating.  She has been more anxious lately.  Denies suicidal or homicidal thoughts.  States that attention is good without easy distractibility.  Able to focus on things and finish tasks to completion.  Adderall is helpful and she cannot imagine how bad things would be if she was not on it.  Patient denies increased energy with decreased need for sleep, no increased talkativeness, no racing thoughts, no impulsivity or risky behaviors, no increased spending, no increased libido, no grandiosity.  Denies dizziness, syncope, seizures, numbness, tingling, tremor, tics, unsteady gait, slurred speech, confusion. Denies muscle or joint pain, stiffness, or dystonia.  Individual Medical History/ Review of Systems: Changes? :No    Past medications for mental health diagnoses include: Chantix, Lamictal, BuSpar, symbyax, Zyprexa, Xanax, Latuda, Effexor, Sonata, Ambien, Prozac, Librium, Wellbutrin, Adderall XR, Cymbalta, Hydroxyzine, Klonopin, Zoloft  Allergies: Betadine [povidone iodine], Amlodipine, Hydrocodone, and Tramadol  Current Medications:  Current Outpatient Medications:  .  albuterol (PROVENTIL HFA;VENTOLIN HFA) 108 (90 BASE) MCG/ACT inhaler, Inhale 2 puffs into the lungs every 6 (six) hours as needed. For ashtma, Disp: , Rfl:  .  [START ON 08/28/2020] amphetamine-dextroamphetamine (ADDERALL XR) 25 MG 24 hr capsule, Take 1 capsule by mouth daily after lunch., Disp: 30 capsule, Rfl: 0 .  [START ON 07/30/2020] amphetamine-dextroamphetamine (  ADDERALL XR) 25 MG 24 hr capsule, Take 1 capsule by mouth daily at 12 noon., Disp: 30 capsule, Rfl: 0 .  [START ON 09/09/2020] amphetamine-dextroamphetamine (ADDERALL XR) 30 MG 24 hr capsule, Take 1 capsule (30 mg total) by mouth daily., Disp: 30 capsule, Rfl: 0 .  [START ON 08/11/2020]  amphetamine-dextroamphetamine (ADDERALL XR) 30 MG 24 hr capsule, Take 1 capsule (30 mg total) by mouth daily., Disp: 30 capsule, Rfl: 0 .  busPIRone (BUSPAR) 30 MG tablet, TAKE 1 TABLET(30 MG) BY MOUTH THREE TIMES DAILY, Disp: 90 tablet, Rfl: 5 .  clonazePAM (KLONOPIN) 0.5 MG tablet, Take 1 tablet (0.5 mg total) by mouth daily as needed for anxiety., Disp: 30 tablet, Rfl: 5 .  Dulaglutide (TRULICITY) 0.75 MG/0.5ML SOPN, ADMINISTER 0.75 MG UNDER THE SKIN 1 TIME A WEEK, Disp: , Rfl:  .  fexofenadine (ALLEGRA) 180 MG tablet, Take 180 mg by mouth daily. , Disp: , Rfl:  .  FLUoxetine (PROZAC) 40 MG capsule, TAKE 2 CAPSULES(80 MG) BY MOUTH DAILY, Disp: 60 capsule, Rfl: 5 .  Fluticasone Propionate (FLONASE NA), Place into the nose., Disp: , Rfl:  .  gabapentin (NEURONTIN) 800 MG tablet, 800 mg 3 (three) times daily. , Disp: , Rfl:  .  lamoTRIgine (LAMICTAL) 200 MG tablet, TAKE 1 TABLET(200 MG) BY MOUTH TWICE DAILY, Disp: 180 tablet, Rfl: 1 .  LINZESS 145 MCG CAPS capsule, Take 145 mcg by mouth daily., Disp: , Rfl:  .  lisinopril-hydrochlorothiazide (ZESTORETIC) 20-12.5 MG tablet, Take 1 tablet by mouth every morning., Disp: , Rfl:  .  meloxicam (MOBIC) 15 MG tablet, Take 1 tablet (15 mg total) by mouth daily., Disp: 30 tablet, Rfl: 3 .  metFORMIN (GLUCOPHAGE) 500 MG tablet, Take 500 mg by mouth 2 (two) times daily with a meal., Disp: , Rfl:  .  OLANZapine (ZYPREXA) 15 MG tablet, TAKE 1 TABLET(15 MG) BY MOUTH AT BEDTIME, Disp: 30 tablet, Rfl: 5 .  omega-3 acid ethyl esters (LOVAZA) 1 g capsule, Take 1 capsule by mouth daily., Disp: , Rfl:  .  ondansetron (ZOFRAN) 4 MG tablet, Take by mouth., Disp: , Rfl:  .  pantoprazole (PROTONIX) 40 MG tablet, Take by mouth., Disp: , Rfl:  .  potassium chloride (K-DUR) 10 MEQ tablet, TAKE 1 TABLET(10 MEQ) BY MOUTH EVERY DAY, Disp: , Rfl:  .  promethazine (PHENERGAN) 25 MG tablet, TAKE 1 TABLET BY MOUTH EVERY 4 HOURS AS NEEDED FOR NAUSEA, Disp: , Rfl:  .  varenicline  (CHANTIX) 1 MG tablet, TAKE 1 TABLET BY MOUTH EVERY 12 HOURS, Disp: 56 tablet, Rfl: 2 .  XIIDRA 5 % SOLN, , Disp: , Rfl:  .  zolpidem (AMBIEN CR) 12.5 MG CR tablet, TAKE 1 TABLET(12.5 MG) BY MOUTH AT BEDTIME AS NEEDED FOR SLEEP, Disp: 30 tablet, Rfl: 5 .  [START ON 06/30/2020] amphetamine-dextroamphetamine (ADDERALL XR) 25 MG 24 hr capsule, Take 1 capsule by mouth every morning., Disp: 30 capsule, Rfl: 0 .  [START ON 07/12/2020] amphetamine-dextroamphetamine (ADDERALL XR) 30 MG 24 hr capsule, Take 1 capsule (30 mg total) by mouth daily., Disp: 30 capsule, Rfl: 0 .  budesonide-formoterol (SYMBICORT) 80-4.5 MCG/ACT inhaler, Inhale 2 puffs into the lungs 2 (two) times daily. (Patient not taking: Reported on 06/21/2020), Disp: , Rfl:  .  gabapentin (NEURONTIN) 400 MG capsule, Take by mouth., Disp: , Rfl:  .  lidocaine (LIDODERM) 5 %, 2 patches daily. (Patient not taking: Reported on 06/21/2020), Disp: , Rfl:  .  terbinafine (LAMISIL) 250 MG tablet, Take  250 mg by mouth daily. (Patient not taking: Reported on 06/21/2020), Disp: , Rfl:  Medication Side Effects: none  Family Medical/ Social History: Changes? No  MENTAL HEALTH EXAM:  There were no vitals taken for this visit.There is no height or weight on file to calculate BMI.  General Appearance: Casual, Neat and Well Groomed  Eye Contact:  Good  Speech:  Clear and Coherent and Normal Rate  Volume:  Normal  Mood:  Euthymic  Affect:  Appropriate  Thought Process:  Goal Directed and Descriptions of Associations: Intact  Orientation:  Full (Time, Place, and Person)  Thought Content: Logical   Suicidal Thoughts:  No  Homicidal Thoughts:  No  Memory:  WNL  Judgement:  Good  Insight:  Good  Psychomotor Activity:  Normal  Concentration:  Concentration: Good and Attention Span: Good  Recall:  Good  Fund of Knowledge: Good  Language: Good  Assets:  Desire for Improvement  ADL's:  Intact  Cognition: WNL  Prognosis:  Good   03/08/2020 glucose 109,  hemoglobin A1c was 6.2 no lipid panel since November 2020  DIAGNOSES:    ICD-10-CM   1. Major depressive disorder, recurrent episode, moderate (HCC)  F33.1   2. Generalized anxiety disorder  F41.1   3. Attention deficit hyperactivity disorder (ADHD), predominantly inattentive type  F90.0   4. Smoker  F17.200   5. Grief  F43.21     Receiving Psychotherapy: No    RECOMMENDATIONS:  PDMP was reviewed. I provided 20 minutes of nonface-to-face time during this encounter. My condolences on her losses. Continue Adderall XR 30 mg every morning. Continue Adderall XR 25 mg daily at lunch. Continue BuSpar 30 mg 1 p.o. 3 times daily. Continue Klonopin 0.5 mg 1 p.o. daily as needed. Continue Prozac 40 mg, 2 p.o. daily. Continue gabapentin 800 mg, 1 p.o. 3 times daily and 400 mg 4 times daily.  Continue Lamictal 200 mg, 1 p.o. twice daily. Continue Zyprexa's 15 mg, nightly. Continue Chantix as directed. Continue Ambien CR 12.5 mg p.o. nightly as needed sleep. Labs are followed closely by another provider.  She has known diabetes.  See labs on chart. Recommend grief counseling through hospice. Return in 6 months.   Melony Overly, PA-C

## 2020-06-23 ENCOUNTER — Ambulatory Visit: Payer: Medicare Other | Admitting: Physician Assistant

## 2020-07-19 ENCOUNTER — Other Ambulatory Visit: Payer: Self-pay

## 2020-07-19 ENCOUNTER — Encounter: Payer: Self-pay | Admitting: Podiatry

## 2020-07-19 ENCOUNTER — Ambulatory Visit (INDEPENDENT_AMBULATORY_CARE_PROVIDER_SITE_OTHER): Payer: Medicare Other | Admitting: Podiatry

## 2020-07-19 DIAGNOSIS — M722 Plantar fascial fibromatosis: Secondary | ICD-10-CM

## 2020-07-19 DIAGNOSIS — S93401D Sprain of unspecified ligament of right ankle, subsequent encounter: Secondary | ICD-10-CM

## 2020-07-19 NOTE — Progress Notes (Signed)
She presents today for follow-up of her bilateral plantar fasciitis states that there is 60 to 70% improved continues her meloxicam and her plantar fascial braces bilaterally.  She states that her ankle however has not gotten any better if anything she feels that is worse and she continues to roll it and she states that she feels very unstable with it.  Objective: Vital signs are stable alert and oriented x3.  Pulses are palpable she has pain on palpation anterior talofibular ligament calcaneofibular ligament with edema no erythema cellulitis drainage or odor  Much decrease in pain on palpation medial calcaneal tubercles bilateral.  Assessment: Subjectively 50% better as far as the plan fasciitis goes and subjectively worse for the right ankle.  Plan: Discussed etiology pathology conservative surgical therapies due to the injury that she sustained about 7 months ago now and her lateral ankle is becoming more more unstable as time goes by conservative therapies have failed to render her asymptomatic so I feel an MRI is necessary to reassess for diagnosis as well as possible surgical consideration.  I reinjected her bilateral plantar fascial today 20 mg of Kenalog 5 mg Marcaine point maximal tenderness bilateral heel she will continue all conservative therapies plantar fascial braces I did switch her to a nail 1906 for a Tri-Lock brace.  We will await her MRI.

## 2020-07-28 ENCOUNTER — Telehealth: Payer: Self-pay

## 2020-07-28 NOTE — Telephone Encounter (Signed)
Pt called and would like ordered placed for a  MRI of her R ankle.

## 2020-08-02 ENCOUNTER — Encounter: Payer: Self-pay | Admitting: Podiatry

## 2020-08-02 ENCOUNTER — Other Ambulatory Visit: Payer: Self-pay | Admitting: Podiatry

## 2020-08-11 ENCOUNTER — Telehealth: Payer: Self-pay | Admitting: Podiatry

## 2020-08-11 DIAGNOSIS — M7671 Peroneal tendinitis, right leg: Secondary | ICD-10-CM

## 2020-08-11 DIAGNOSIS — M25371 Other instability, right ankle: Secondary | ICD-10-CM

## 2020-08-11 DIAGNOSIS — S93401D Sprain of unspecified ligament of right ankle, subsequent encounter: Secondary | ICD-10-CM

## 2020-08-11 NOTE — Telephone Encounter (Signed)
Has this MRI been ordered? Who should she follow up with.

## 2020-08-11 NOTE — Telephone Encounter (Signed)
Patient called inquiring about order for MRI, please advise

## 2020-08-12 NOTE — Telephone Encounter (Signed)
MRI right ankle due to severe ankle sprain and lateral instability. Probable ATFL tear and possible peroneal tear.

## 2020-08-13 NOTE — Telephone Encounter (Signed)
MRI ordered

## 2020-08-13 NOTE — Addendum Note (Signed)
Addended by: Lottie Rater E on: 08/13/2020 04:01 PM   Modules accepted: Orders

## 2020-09-08 ENCOUNTER — Other Ambulatory Visit: Payer: Self-pay

## 2020-09-08 ENCOUNTER — Ambulatory Visit
Admission: RE | Admit: 2020-09-08 | Discharge: 2020-09-08 | Disposition: A | Discharge status: Home / Self Care | Payer: BC Managed Care – PPO | Source: Ambulatory Visit | Attending: Podiatry | Admitting: Podiatry

## 2020-09-08 DIAGNOSIS — S93401D Sprain of unspecified ligament of right ankle, subsequent encounter: Secondary | ICD-10-CM

## 2020-09-08 DIAGNOSIS — M7671 Peroneal tendinitis, right leg: Secondary | ICD-10-CM

## 2020-09-08 DIAGNOSIS — M25371 Other instability, right ankle: Secondary | ICD-10-CM

## 2020-09-10 ENCOUNTER — Encounter: Payer: Self-pay | Admitting: Podiatry

## 2020-09-13 ENCOUNTER — Encounter: Payer: Self-pay | Admitting: Podiatry

## 2020-09-13 ENCOUNTER — Ambulatory Visit (INDEPENDENT_AMBULATORY_CARE_PROVIDER_SITE_OTHER): Payer: Medicare Other | Admitting: Podiatry

## 2020-09-13 ENCOUNTER — Other Ambulatory Visit: Payer: Self-pay

## 2020-09-13 DIAGNOSIS — M722 Plantar fascial fibromatosis: Secondary | ICD-10-CM

## 2020-09-13 DIAGNOSIS — M2041 Other hammer toe(s) (acquired), right foot: Secondary | ICD-10-CM

## 2020-09-13 DIAGNOSIS — S86311A Strain of muscle(s) and tendon(s) of peroneal muscle group at lower leg level, right leg, initial encounter: Secondary | ICD-10-CM

## 2020-09-13 MED ORDER — TRIAMCINOLONE ACETONIDE 40 MG/ML IJ SUSP
40.0000 mg | Freq: Once | INTRAMUSCULAR | Status: AC
  Administered 2020-09-13: 40 mg

## 2020-09-13 NOTE — Progress Notes (Signed)
She presents today for follow-up of her painful ankle right.  She states that it still hurts badly as does the plantar fascia.  She states that she would like to have her toes corrected because they are painful in her shoes, she notices they are just starting to drawl and pull.  Objective: Vital signs are stable alert oriented x3.  Pulses are palpable.  Flexible hammertoe deformities 3 through 5 #2 there is slightly rigid at the PIPJ.  Otherwise at the metatarsophalangeal joints that appear to lie flat.  MRI states that she has significant tear of the peroneus brevis behind and just inferior to the lateral malleolus.  She also has hammertoe deformities and plantar fasciitis.  Assessment: Plan fasciitis tear of the peroneal tendon hammertoe deformities.  Plan: Consented her today for primary repair of the peroneal tendon with a cast.  Also a endoscopic plantar fasciotomy and a hammertoe repair with K wires.  We did discuss pros and cons of the surgery possible side effects complications.  She understands this is amenable to it.

## 2020-09-16 ENCOUNTER — Telehealth: Payer: Self-pay

## 2020-09-16 NOTE — Telephone Encounter (Signed)
DOS 09/24/2020  EPF RT - 00370 REPAIR TENDON PERONEAL RT - 48889 HAMMERTOE REPAIR 2-5 RT - 28285  [employer] EFFECTIVE DATE - 09/26/2019  PLAN DEDUCTIBLE - $1250.00 W/ $0.00 REMAINING OUT OF POCKET - $4000.00 W/ $0.00 REMAINING COPAY $0.00 COINSURANCE - 20%  RECEIVED AUTH FAX FOR CPT [phone], [phone] & [phone]  AUTH # [id number] GOOD FROM 09/14/2020 - 09/24/2020

## 2020-09-21 ENCOUNTER — Telehealth: Payer: Self-pay | Admitting: Physician Assistant

## 2020-09-21 ENCOUNTER — Other Ambulatory Visit: Payer: Self-pay | Admitting: Physician Assistant

## 2020-09-21 MED ORDER — AMPHETAMINE-DEXTROAMPHET ER 25 MG PO CP24: 25.0000 mg | ORAL_CAPSULE | Freq: Every day | ORAL | 0 refills | Status: DC

## 2020-09-21 MED ORDER — AMPHETAMINE-DEXTROAMPHET ER 30 MG PO CP24: 30.0000 mg | ORAL_CAPSULE | Freq: Every day | ORAL | 0 refills | Status: DC

## 2020-09-21 NOTE — Telephone Encounter (Signed)
Prescription were sent.

## 2020-09-21 NOTE — Telephone Encounter (Signed)
Pt called and needs a refill on her adderall xr 25 mg and adderall xr 30 mg to the [facility] in [place]

## 2020-09-22 ENCOUNTER — Other Ambulatory Visit: Payer: Self-pay | Admitting: Podiatry

## 2020-09-22 MED ORDER — ONDANSETRON HCL 4 MG PO TABS: 4.0000 mg | ORAL_TABLET | Freq: Three times a day (TID) | ORAL | 0 refills | Status: DC | PRN

## 2020-09-22 MED ORDER — CEPHALEXIN 500 MG PO CAPS: 500.0000 mg | ORAL_CAPSULE | Freq: Three times a day (TID) | ORAL | 0 refills | Status: DC

## 2020-09-22 MED ORDER — OXYCODONE-ACETAMINOPHEN 10-325 MG PO TABS: 1.0000 | ORAL_TABLET | Freq: Three times a day (TID) | ORAL | 0 refills | Status: DC | PRN

## 2020-09-24 DIAGNOSIS — M2041 Other hammer toe(s) (acquired), right foot: Secondary | ICD-10-CM

## 2020-09-24 DIAGNOSIS — S93401D Sprain of unspecified ligament of right ankle, subsequent encounter: Secondary | ICD-10-CM

## 2020-09-24 DIAGNOSIS — S86311A Strain of muscle(s) and tendon(s) of peroneal muscle group at lower leg level, right leg, initial encounter: Secondary | ICD-10-CM

## 2020-09-29 ENCOUNTER — Other Ambulatory Visit: Payer: Self-pay | Admitting: Podiatry

## 2020-09-29 ENCOUNTER — Ambulatory Visit (INDEPENDENT_AMBULATORY_CARE_PROVIDER_SITE_OTHER): Payer: Medicare Other

## 2020-09-29 ENCOUNTER — Ambulatory Visit (INDEPENDENT_AMBULATORY_CARE_PROVIDER_SITE_OTHER): Payer: Medicare Other | Admitting: Podiatry

## 2020-09-29 ENCOUNTER — Other Ambulatory Visit: Payer: Self-pay

## 2020-09-29 ENCOUNTER — Encounter: Payer: Self-pay | Admitting: Podiatry

## 2020-09-29 VITALS — BP 113/80 | HR 90 | Temp 97.7°F

## 2020-09-29 DIAGNOSIS — M2041 Other hammer toe(s) (acquired), right foot: Secondary | ICD-10-CM

## 2020-09-29 MED ORDER — OXYCODONE-ACETAMINOPHEN 10-325 MG PO TABS: 1.0000 | ORAL_TABLET | Freq: Three times a day (TID) | ORAL | 0 refills | Status: AC | PRN

## 2020-09-29 MED ORDER — ONDANSETRON HCL 4 MG PO TABS
4.0000 mg | ORAL_TABLET | Freq: Three times a day (TID) | ORAL | 0 refills | Status: DC | PRN
Start: 2020-09-29 — End: 2020-10-04

## 2020-09-29 NOTE — Progress Notes (Signed)
She presents today for her first postop visit date of surgery is 09/24/2020 right foot peroneal brevis tendon repair, endoscopic plantar fasciotomy, hammertoe repair second through fifth, and application of a cast.  She states that she fell multiple times at home still has pain but tolerable with other pain medications.  Has been having very nauseated episodes Zofran has helped.  Needs more pain medicine as well as Zofran.  She denies fever chills nausea vomiting muscle aches pains calf pain back pain chest pain shortness of breath.  Objective: Presents with a knee scooter today in a cast intact no erythema cellulitis drainage or odor her toes are mildly edematous K wires in the second digit right foot in a good position.  Radiographs confirm staples are intact and K wires intact to the second toe.  Assessment: Well-healing surgical foot.  Plan: We will remove the cast next week and reapply.  I did tolerate more Zofran and more pain medication.

## 2020-10-03 NOTE — Telephone Encounter (Signed)
Please advise 

## 2020-10-06 ENCOUNTER — Encounter: Payer: Self-pay | Admitting: Podiatry

## 2020-10-06 ENCOUNTER — Ambulatory Visit (INDEPENDENT_AMBULATORY_CARE_PROVIDER_SITE_OTHER): Payer: Medicare Other | Admitting: Podiatry

## 2020-10-06 ENCOUNTER — Encounter: Payer: BC Managed Care – PPO | Admitting: Podiatry

## 2020-10-06 ENCOUNTER — Other Ambulatory Visit: Payer: Self-pay

## 2020-10-06 DIAGNOSIS — M722 Plantar fascial fibromatosis: Secondary | ICD-10-CM

## 2020-10-06 DIAGNOSIS — M2041 Other hammer toe(s) (acquired), right foot: Secondary | ICD-10-CM | POA: Diagnosis not present

## 2020-10-06 DIAGNOSIS — S86311A Strain of muscle(s) and tendon(s) of peroneal muscle group at lower leg level, right leg, initial encounter: Secondary | ICD-10-CM

## 2020-10-06 DIAGNOSIS — Z9889 Other specified postprocedural states: Secondary | ICD-10-CM | POA: Diagnosis not present

## 2020-10-06 MED ORDER — OXYCODONE-ACETAMINOPHEN 10-325 MG PO TABS: 1.0000 | ORAL_TABLET | Freq: Three times a day (TID) | ORAL | 0 refills | Status: AC | PRN

## 2020-10-06 MED ORDER — ONDANSETRON HCL 4 MG PO TABS: 4.0000 mg | ORAL_TABLET | Freq: Three times a day (TID) | ORAL | 0 refills | Status: DC | PRN

## 2020-10-06 NOTE — Progress Notes (Signed)
She presents today for follow-up of her peroneal tendon repair endoscopic plantar fasciotomy hammertoe repair 2 through 5.  She denies fever chills nausea vomiting muscle aches and pains.  Objective: Cast is intact K wires intact was removed demonstrates staples are intact sutures are intact margins well coapted.  Assessment: Well-healing surgical foot.  Plan: I recast today dressed a compressive dressing was applied follow-up with her in 2 weeks for cast removal suture removal and staple removal.  She will be placed in a cam walker at that point still nonweightbearing status.

## 2020-10-13 ENCOUNTER — Other Ambulatory Visit: Payer: Self-pay | Admitting: Physician Assistant

## 2020-10-13 MED ORDER — ADDERALL XR 25 MG PO CP24: 25.0000 mg | ORAL_CAPSULE | Freq: Every day | ORAL | 0 refills | Status: DC

## 2020-10-13 MED ORDER — ADDERALL XR 30 MG PO CP24: 30.0000 mg | ORAL_CAPSULE | Freq: Every day | ORAL | 0 refills | Status: DC

## 2020-10-14 ENCOUNTER — Other Ambulatory Visit: Payer: Self-pay | Admitting: Podiatry

## 2020-10-14 ENCOUNTER — Telehealth: Payer: Self-pay | Admitting: Podiatry

## 2020-10-14 MED ORDER — OXYCODONE-ACETAMINOPHEN 10-325 MG PO TABS
1.0000 | ORAL_TABLET | Freq: Three times a day (TID) | ORAL | 0 refills | Status: AC | PRN
Start: 2020-10-14 — End: 2020-10-21

## 2020-10-18 NOTE — Telephone Encounter (Signed)
Rx was sent  

## 2020-10-20 ENCOUNTER — Other Ambulatory Visit: Payer: Self-pay

## 2020-10-20 ENCOUNTER — Encounter: Payer: Self-pay | Admitting: Podiatry

## 2020-10-20 ENCOUNTER — Ambulatory Visit (INDEPENDENT_AMBULATORY_CARE_PROVIDER_SITE_OTHER): Payer: BC Managed Care – PPO | Admitting: Podiatry

## 2020-10-20 DIAGNOSIS — S86311A Strain of muscle(s) and tendon(s) of peroneal muscle group at lower leg level, right leg, initial encounter: Secondary | ICD-10-CM | POA: Diagnosis not present

## 2020-10-20 DIAGNOSIS — Z9889 Other specified postprocedural states: Secondary | ICD-10-CM | POA: Diagnosis not present

## 2020-10-20 DIAGNOSIS — M722 Plantar fascial fibromatosis: Secondary | ICD-10-CM | POA: Diagnosis not present

## 2020-10-20 DIAGNOSIS — M2041 Other hammer toe(s) (acquired), right foot: Secondary | ICD-10-CM

## 2020-10-20 MED ORDER — OXYCODONE-ACETAMINOPHEN 10-325 MG PO TABS: 1.0000 | ORAL_TABLET | Freq: Three times a day (TID) | ORAL | 0 refills | Status: AC | PRN

## 2020-10-20 NOTE — Progress Notes (Signed)
She presents today date of surgery 09/24/2020 right foot peroneal brevis tendon repair EPF hammertoe repair two through five cast application states that is feeling fine but it does tend to hurt and she would like more narcotics.  Objective: Cast is intact was removed demonstrates no erythema edema cellulitis drainage or odor sutures and staples were removed.  K wires intact to the second toe though she does state that one of her children or grandchildren hit it and it caused a lot of pain.  The wire was pushed deep into the toe and the pin Was pressing against the skin I distracted the wire today.  Assessment: Well-healing surgical foot.  Plan: I went ahead and put her in a compression anklet and a cam walker she is to not walk on this but only stand static.  I will follow-up with her in about 2 weeks at which time we will hopefully pull the pin and get her walking in the cam boot.

## 2020-10-30 ENCOUNTER — Other Ambulatory Visit: Payer: Self-pay | Admitting: Physician Assistant

## 2020-11-03 ENCOUNTER — Ambulatory Visit (INDEPENDENT_AMBULATORY_CARE_PROVIDER_SITE_OTHER): Payer: Medicare Other | Admitting: Podiatry

## 2020-11-03 ENCOUNTER — Other Ambulatory Visit: Payer: Self-pay

## 2020-11-03 ENCOUNTER — Encounter: Payer: Self-pay | Admitting: Podiatry

## 2020-11-03 DIAGNOSIS — M2041 Other hammer toe(s) (acquired), right foot: Secondary | ICD-10-CM

## 2020-11-03 DIAGNOSIS — S86311A Strain of muscle(s) and tendon(s) of peroneal muscle group at lower leg level, right leg, initial encounter: Secondary | ICD-10-CM

## 2020-11-03 DIAGNOSIS — M722 Plantar fascial fibromatosis: Secondary | ICD-10-CM

## 2020-11-03 DIAGNOSIS — Z9889 Other specified postprocedural states: Secondary | ICD-10-CM

## 2020-11-03 NOTE — Progress Notes (Signed)
She presents today for follow-up of her peroneus brevis repair and her EPF as well as hammertoe repairs. She denies fever chills nausea vomiting muscle aches and pains that she has been in the ED recently due to hitting her second toe.  Objective: Vital signs are stable alert oriented x3 toe is slightly swollen the toe demonstrates some some bruising on the tip of the toe no pain on palpation of the perineal surgical site no edema there. No pain on palpation of the EPF site.  Assessment: Well-healing surgical foot.  Plan: Remove the K wire today I will allow her a couple of days before she can get this wet and that she can start bearing weight on it and ambulating with her cam walker. I will follow-up with her in about 2 weeks at which time we will put her in a compression anklet a Tri-Lock brace and a tennis shoe.

## 2020-11-07 ENCOUNTER — Other Ambulatory Visit: Payer: Self-pay | Admitting: Podiatry

## 2020-11-13 ENCOUNTER — Other Ambulatory Visit: Payer: Self-pay | Admitting: Physician Assistant

## 2020-11-16 ENCOUNTER — Telehealth: Payer: Self-pay

## 2020-11-16 NOTE — Telephone Encounter (Signed)
Prior Authorization submitted and approved for ADDERALL XR 25 MG #30 effective 11/16/2020-11/16/2021 PA# [id number] With [facility] ID# [id number]

## 2020-11-18 ENCOUNTER — Telehealth: Payer: Self-pay | Admitting: Physician Assistant

## 2020-11-18 ENCOUNTER — Other Ambulatory Visit: Payer: Self-pay | Admitting: Physician Assistant

## 2020-11-18 MED ORDER — ADDERALL XR 25 MG PO CP24: 25.0000 mg | ORAL_CAPSULE | Freq: Every day | ORAL | 0 refills | Status: DC

## 2020-11-18 NOTE — Telephone Encounter (Signed)
Can you confirm this and then I'll send in Rx. Thanks

## 2020-11-18 NOTE — Telephone Encounter (Signed)
Rx was sent  

## 2020-11-18 NOTE — Telephone Encounter (Signed)
Pt called to report [facility] in [place] filled only 5 pills of Adderall XR 25 mg. Ask for new Rx to be sent to [facility] location on file for remainder of the month. Always have issues with [place] location. Apt 3/23

## 2020-11-18 NOTE — Telephone Encounter (Signed)
Pharmacy confirmed they were only able to fill 5 tabs.

## 2020-11-19 NOTE — Telephone Encounter (Signed)
reviewed

## 2020-11-22 ENCOUNTER — Ambulatory Visit (INDEPENDENT_AMBULATORY_CARE_PROVIDER_SITE_OTHER): Payer: BC Managed Care – PPO | Admitting: Podiatry

## 2020-11-22 ENCOUNTER — Other Ambulatory Visit: Payer: Self-pay

## 2020-11-22 ENCOUNTER — Encounter: Payer: Self-pay | Admitting: Podiatry

## 2020-11-22 ENCOUNTER — Ambulatory Visit (INDEPENDENT_AMBULATORY_CARE_PROVIDER_SITE_OTHER): Payer: BC Managed Care – PPO

## 2020-11-22 DIAGNOSIS — M2042 Other hammer toe(s) (acquired), left foot: Secondary | ICD-10-CM

## 2020-11-22 DIAGNOSIS — S86311A Strain of muscle(s) and tendon(s) of peroneal muscle group at lower leg level, right leg, initial encounter: Secondary | ICD-10-CM

## 2020-11-22 DIAGNOSIS — M722 Plantar fascial fibromatosis: Secondary | ICD-10-CM

## 2020-11-22 DIAGNOSIS — Z9889 Other specified postprocedural states: Secondary | ICD-10-CM

## 2020-11-22 DIAGNOSIS — M2041 Other hammer toe(s) (acquired), right foot: Secondary | ICD-10-CM | POA: Diagnosis not present

## 2020-11-22 MED ORDER — METHYLPREDNISOLONE 4 MG PO TBPK: ORAL_TABLET | ORAL | 0 refills | Status: DC

## 2020-11-22 NOTE — Progress Notes (Signed)
She presents today for follow-up of her surgical foot and ankle right.  States that she has been doing a lot with her foot now that she is been moving.  She is moving houses and trying to get through this without a lot of pain.  She states that her toe as she refers to her second toe where the pin came out is the most painful but her right ankle has been killing her recently.  States that she heard a pop in that ankle almost immediately following her last visit to see [place].  Objective: Vital signs are stable she is alert and oriented x3 there is no erythema moderate edema overlying the lateral ankle right.  She also has swelling and mild erythema over the second toe of the right foot.  There is slight medial deviation.  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  Last time we saw it it was in good position.  Assessment: Problematic surgical ankle right well-healing endoscopic fasciotomy hammertoe repair has not done well either secondary to early pin distraction.  Plan: At this point were going to try to get her back into her Tri-Lock brace with a tennis shoe get out of her boot.  She is to continue to wear compression anklet so the foot and ankle and wrap the toe daily I will to follow-up with her in 3 to 4 weeks if not improved consider MRI.  I also started her on a Medrol Dosepak.

## 2020-11-24 ENCOUNTER — Encounter: Payer: Self-pay | Admitting: Podiatry

## 2020-11-24 MED ORDER — OXYCODONE-ACETAMINOPHEN 10-325 MG PO TABS: 1.0000 | ORAL_TABLET | Freq: Three times a day (TID) | ORAL | 0 refills | Status: AC | PRN

## 2020-12-15 ENCOUNTER — Encounter: Payer: Self-pay | Admitting: Physician Assistant

## 2020-12-15 ENCOUNTER — Other Ambulatory Visit: Payer: Self-pay

## 2020-12-15 ENCOUNTER — Ambulatory Visit (INDEPENDENT_AMBULATORY_CARE_PROVIDER_SITE_OTHER): Payer: BC Managed Care – PPO | Admitting: Physician Assistant

## 2020-12-15 ENCOUNTER — Other Ambulatory Visit: Payer: Self-pay | Admitting: Physician Assistant

## 2020-12-15 DIAGNOSIS — F411 Generalized anxiety disorder: Secondary | ICD-10-CM | POA: Diagnosis not present

## 2020-12-15 DIAGNOSIS — F329 Major depressive disorder, single episode, unspecified: Secondary | ICD-10-CM | POA: Diagnosis not present

## 2020-12-15 DIAGNOSIS — Z79899 Other long term (current) drug therapy: Secondary | ICD-10-CM

## 2020-12-15 DIAGNOSIS — F17200 Nicotine dependence, unspecified, uncomplicated: Secondary | ICD-10-CM

## 2020-12-15 DIAGNOSIS — F900 Attention-deficit hyperactivity disorder, predominantly inattentive type: Secondary | ICD-10-CM

## 2020-12-15 MED ORDER — ZOLPIDEM TARTRATE ER 12.5 MG PO TBCR: EXTENDED_RELEASE_TABLET | ORAL | 5 refills | Status: DC

## 2020-12-15 MED ORDER — AMPHETAMINE-DEXTROAMPHET ER 30 MG PO CP24: 30.0000 mg | ORAL_CAPSULE | Freq: Every day | ORAL | 0 refills | Status: DC

## 2020-12-15 MED ORDER — OLANZAPINE 15 MG PO TABS: ORAL_TABLET | ORAL | 5 refills | Status: DC

## 2020-12-15 MED ORDER — AMPHETAMINE-DEXTROAMPHET ER 25 MG PO CP24: 25.0000 mg | ORAL_CAPSULE | Freq: Every day | ORAL | 0 refills | Status: DC

## 2020-12-15 MED ORDER — CHANTIX STARTING MONTH PAK 0.5 MG X 11 & 1 MG X 42 PO TABS: ORAL_TABLET | ORAL | 0 refills | Status: DC

## 2020-12-15 MED ORDER — CLONAZEPAM 0.5 MG PO TABS: 0.5000 mg | ORAL_TABLET | Freq: Every day | ORAL | 5 refills | Status: DC | PRN

## 2020-12-15 MED ORDER — VARENICLINE TARTRATE 1 MG PO TABS: ORAL_TABLET | ORAL | 2 refills | Status: DC

## 2020-12-15 MED ORDER — FLUOXETINE HCL 40 MG PO CAPS: ORAL_CAPSULE | ORAL | 5 refills | Status: DC

## 2020-12-15 NOTE — Progress Notes (Signed)
[facility]  Patient ID: Katrina Booker,  MRN: [id number]  PCP: Dorothey Baseman, MD  Date of Evaluation: 12/15/2020 Time spent:30 minutes  Chief Complaint:  Chief Complaint    Anxiety; Depression; ADD; Insomnia; Follow-up      HISTORY/CURRENT STATUS: HPI for routine med check.  Is doing well.  Feels that all meds are working fine.  Is able to enjoy things.  Energy and motivation are good.  She is helping out with her 44-year-old grandson since her daughter had her grand daughter 3 weeks ago.  She is not isolating.  Not crying easily.  Sleeps well with Ambien.  No suicidal or homicidal thoughts.  States that attention is good without easy distractibility.  Able to focus on things and finish tasks to completion.   Would like to restart Chantix if at all possible.  It had been recalled sometime in the past 6 months but I believe it is back on the market.  Patient denies increased energy with decreased need for sleep, no increased talkativeness, no racing thoughts, no impulsivity or risky behaviors, no increased spending, no increased libido, no grandiosity.  No paranoia, and no hallucinations.  Denies dizziness, syncope, seizures, numbness, tingling, tremor, tics, unsteady gait, slurred speech, confusion. Denies muscle or joint pain, stiffness, or dystonia.  Individual Medical History/ Review of Systems: Changes? :No     Past medications for mental health diagnoses include: Chantix, Lamictal, BuSpar, symbyax, Zyprexa, Xanax, Latuda, Effexor, Sonata, Ambien, Prozac, Librium, Wellbutrin, Adderall XR, Cymbalta, Hydroxyzine, Klonopin, Zoloft  Allergies: Betadine [povidone iodine], Ketorolac, Amlodipine, Hydrocodone, and Tramadol  Current Medications:  Current Outpatient Medications:  .  albuterol (PROVENTIL HFA;VENTOLIN HFA) 108 (90 BASE) MCG/ACT inhaler, Inhale 2 puffs into the lungs every 6 (six) hours as needed. For ashtma, Disp: , Rfl:  .  busPIRone (BUSPAR) 30 MG  tablet, TAKE 1 TABLET(30 MG) BY MOUTH THREE TIMES DAILY, Disp: 90 tablet, Rfl: 5 .  Dulaglutide (TRULICITY) 0.75 MG/0.5ML SOPN, ADMINISTER 0.75 MG UNDER THE SKIN 1 TIME A WEEK, Disp: , Rfl:  .  fexofenadine (ALLEGRA) 180 MG tablet, Take 180 mg by mouth daily. , Disp: , Rfl:  .  fluticasone (FLONASE) 50 MCG/ACT nasal spray, Place into both nostrils., Disp: , Rfl:  .  gabapentin (NEURONTIN) 400 MG capsule, Take 400 mg by mouth. Up to qid prn, Disp: , Rfl:  .  gabapentin (NEURONTIN) 800 MG tablet, 800 mg 3 (three) times daily. , Disp: , Rfl:  .  lamoTRIgine (LAMICTAL) 200 MG tablet, TAKE 1 TABLET(200 MG) BY MOUTH TWICE DAILY, Disp: 180 tablet, Rfl: 1 .  LINZESS 145 MCG CAPS capsule, Take 145 mcg by mouth daily., Disp: , Rfl:  .  lisinopril-hydrochlorothiazide (ZESTORETIC) 20-12.5 MG tablet, Take 1 tablet by mouth every morning., Disp: , Rfl:  .  meloxicam (MOBIC) 15 MG tablet, TAKE 1 TABLET(15 MG) BY MOUTH DAILY, Disp: 90 tablet, Rfl: 3 .  metFORMIN (GLUCOPHAGE) 500 MG tablet, Take 500 mg by mouth 2 (two) times daily with a meal., Disp: , Rfl:  .  Omega-3 1000 MG CAPS, Take by mouth., Disp: , Rfl:  .  ondansetron (ZOFRAN) 4 MG tablet, TAKE 1 TABLET(4 MG) BY MOUTH EVERY 8 HOURS AS NEEDED, Disp: 20 tablet, Rfl: 0 .  pantoprazole (PROTONIX) 40 MG tablet, Take by mouth., Disp: , Rfl:  .  potassium chloride (K-DUR) 10 MEQ tablet, TAKE 1 TABLET(10 MEQ) BY MOUTH EVERY DAY, Disp: , Rfl:  .  promethazine (PHENERGAN) 25 MG tablet, TAKE 1 TABLET  BY MOUTH EVERY 4 HOURS AS NEEDED FOR NAUSEA, Disp: , Rfl:  .  propranolol (INDERAL) 10 MG tablet, Take by mouth. Up to 5 qd., Disp: , Rfl:  .  varenicline (CHANTIX STARTING MONTH PAK) 0.5 MG X 11 & 1 MG X 42 tablet, Take one 0.5 mg tablet by mouth once daily for 3 days, then increase to one 0.5 mg tablet twice daily for 4 days, then increase to one 1 mg tablet twice daily., Disp: 53 tablet, Rfl: 0 .  XIIDRA 5 % SOLN, , Disp: , Rfl:  .  [START ON 02/12/2021]  amphetamine-dextroamphetamine (ADDERALL XR) 25 MG 24 hr capsule, Take 1 capsule by mouth daily after lunch., Disp: 30 capsule, Rfl: 0 .  [START ON 01/14/2021] amphetamine-dextroamphetamine (ADDERALL XR) 25 MG 24 hr capsule, Take 1 capsule by mouth daily at 12 noon., Disp: 30 capsule, Rfl: 0 .  amphetamine-dextroamphetamine (ADDERALL XR) 25 MG 24 hr capsule, Take 1 capsule by mouth daily with lunch., Disp: 30 capsule, Rfl: 0 .  [START ON 03/04/2021] amphetamine-dextroamphetamine (ADDERALL XR) 30 MG 24 hr capsule, Take 1 capsule (30 mg total) by mouth daily., Disp: 30 capsule, Rfl: 0 .  [START ON 02/02/2021] amphetamine-dextroamphetamine (ADDERALL XR) 30 MG 24 hr capsule, Take 1 capsule (30 mg total) by mouth daily., Disp: 30 capsule, Rfl: 0 .  [START ON 01/04/2021] amphetamine-dextroamphetamine (ADDERALL XR) 30 MG 24 hr capsule, Take 1 capsule (30 mg total) by mouth daily., Disp: 30 capsule, Rfl: 0 .  budesonide-formoterol (SYMBICORT) 80-4.5 MCG/ACT inhaler, Inhale 2 puffs into the lungs 2 (two) times daily. (Patient not taking: No sig reported), Disp: , Rfl:  .  cephALEXin (KEFLEX) 500 MG capsule, Take 1 capsule (500 mg total) by mouth 3 (three) times daily., Disp: 30 capsule, Rfl: 0 .  clonazePAM (KLONOPIN) 0.5 MG tablet, Take 1 tablet (0.5 mg total) by mouth daily as needed for anxiety., Disp: 30 tablet, Rfl: 5 .  FLUoxetine (PROZAC) 40 MG capsule, TAKE 2 CAPSULES(80 MG) BY MOUTH DAILY, Disp: 60 capsule, Rfl: 5 .  lidocaine (LIDODERM) 5 %, 2 patches daily. (Patient not taking: No sig reported), Disp: , Rfl:  .  methylPREDNISolone (MEDROL DOSEPAK) 4 MG TBPK tablet, 6 day dose pack - take as directed, Disp: 21 tablet, Rfl: 0 .  OLANZapine (ZYPREXA) 15 MG tablet, TAKE 1 TABLET(15 MG) BY MOUTH AT BEDTIME, Disp: 30 tablet, Rfl: 5 .  omega-3 acid ethyl esters (LOVAZA) 1 g capsule, Take 1 capsule by mouth daily. (Patient not taking: Reported on 12/15/2020), Disp: , Rfl:  .  ondansetron (ZOFRAN) 4 MG tablet, TAKE  1 TABLET(4 MG) BY MOUTH EVERY 8 HOURS AS NEEDED, Disp: 20 tablet, Rfl: 0 .  terbinafine (LAMISIL) 250 MG tablet, Take 250 mg by mouth daily. (Patient not taking: No sig reported), Disp: , Rfl:  .  varenicline (CHANTIX) 1 MG tablet, TAKE 1 TABLET BY MOUTH EVERY 12 HOURS, Disp: 56 tablet, Rfl: 2 .  zolpidem (AMBIEN CR) 12.5 MG CR tablet, TAKE 1 TABLET(12.5 MG) BY MOUTH AT BEDTIME AS NEEDED FOR SLEEP, Disp: 30 tablet, Rfl: 5 Medication Side Effects: none  Family Medical/ Social History: Changes? No  MENTAL HEALTH EXAM:  There were no vitals taken for this visit.There is no height or weight on file to calculate BMI.  General Appearance: Casual, Neat and Well Groomed  Eye Contact:  Good  Speech:  Clear and Coherent and Normal Rate  Volume:  Normal  Mood:  Euthymic  Affect:  Appropriate  Thought  Process:  Goal Directed and Descriptions of Associations: Intact  Orientation:  Full (Time, Place, and Person)  Thought Content: Logical   Suicidal Thoughts:  No  Homicidal Thoughts:  No  Memory:  WNL  Judgement:  Good  Insight:  Good  Psychomotor Activity:  Normal  Concentration:  Concentration: Good and Attention Span: Good  Recall:  Good  Fund of Knowledge: Good  Language: Good  Assets:  Desire for Improvement  ADL's:  Intact  Cognition: WNL  Prognosis:  Good    DIAGNOSES:    ICD-10-CM   1. Generalized anxiety disorder  F41.1   2. Attention deficit hyperactivity disorder (ADHD), predominantly inattentive type  F90.0   3. Smoker  F17.200   4. Reactive depression  F32.9   5. Polypharmacy  Z79.899     Receiving Psychotherapy: No    RECOMMENDATIONS:  PDMP was reviewed. I provided 30 minutes of face-to-face time during this encounter, including time spent before and after the visit and records review and charting. She is doing well on the current treatment so no changes will be made.  We have tried getting her off of a benzo since she is also on a stimulant, but it never went well.   I have elected to keep her on both, as she is only taking the Klonopin 1 time per day. Smoking cessation counseling given. Continue Adderall XR 30 mg every morning. Continue Adderall XR 25 mg daily at lunch. Continue BuSpar 30 mg 1 p.o. 3 times daily. Continue Klonopin 0.5 mg 1 p.o. daily as needed. Continue Prozac 40 mg, 2 p.o. daily. Continue gabapentin 800 mg, 1 p.o. 3 times daily and 400 mg 4 times daily.  Continue Lamictal 200 mg, 1 p.o. twice daily. Continue Zyprexa's 15 mg, nightly. Restart Chantix as directed. Continue Ambien CR 12.5 mg p.o. nightly as needed sleep. Labs are followed closely by another provider.  She has known diabetes.  See labs on chart. Return in 6 months.   Melony Overly, PA-C

## 2020-12-16 ENCOUNTER — Other Ambulatory Visit: Payer: Self-pay | Admitting: Physician Assistant

## 2020-12-16 MED ORDER — VARENICLINE TARTRATE 0.5 MG PO TABS: ORAL_TABLET | ORAL | 0 refills | Status: DC

## 2020-12-16 NOTE — Telephone Encounter (Signed)
Controlled substance 

## 2020-12-21 ENCOUNTER — Telehealth: Payer: Self-pay | Admitting: Physician Assistant

## 2020-12-21 NOTE — Telephone Encounter (Signed)
Rtc to pt and given information. She spoke with her pharmacist last night and they reported she could get an early refill as long as she paid cash. Informed her it was Teresa's decision and she did not allow early refill on that class of drug.  She did not seem very happy with that. She has not done this before so she was obviously upset.

## 2020-12-21 NOTE — Telephone Encounter (Signed)
Pt called and said that she accidentally threw a way 23 pills of her adderall 20 mg in the trash dumpster in her apartment complex. She filled up her pill box for the week and will be out by Sunday. She talked to the pharmacy [facility] in [place] and they said if we send in a new script and ask for an override due to what happened they would fill it. She is aware that she will have to pay out of pocket for them since she just picked them up. Please let her know if you will be able to accommodate her with this request. Her phone number is [phone]

## 2020-12-21 NOTE — Telephone Encounter (Signed)
Not able to Rx early b/c of that class drug.

## 2020-12-21 NOTE — Telephone Encounter (Signed)
Please review

## 2020-12-22 ENCOUNTER — Ambulatory Visit (INDEPENDENT_AMBULATORY_CARE_PROVIDER_SITE_OTHER): Payer: BC Managed Care – PPO | Admitting: Podiatry

## 2020-12-22 ENCOUNTER — Ambulatory Visit: Payer: Medicare Other

## 2020-12-22 ENCOUNTER — Other Ambulatory Visit: Payer: Self-pay

## 2020-12-22 DIAGNOSIS — M2041 Other hammer toe(s) (acquired), right foot: Secondary | ICD-10-CM

## 2020-12-22 DIAGNOSIS — Z9889 Other specified postprocedural states: Secondary | ICD-10-CM

## 2020-12-22 DIAGNOSIS — M2042 Other hammer toe(s) (acquired), left foot: Secondary | ICD-10-CM | POA: Diagnosis not present

## 2020-12-22 DIAGNOSIS — M722 Plantar fascial fibromatosis: Secondary | ICD-10-CM

## 2020-12-22 DIAGNOSIS — S86311D Strain of muscle(s) and tendon(s) of peroneal muscle group at lower leg level, right leg, subsequent encounter: Secondary | ICD-10-CM

## 2020-12-22 MED ORDER — TRIAMCINOLONE ACETONIDE 40 MG/ML IJ SUSP
20.0000 mg | Freq: Once | INTRAMUSCULAR | Status: AC
  Administered 2020-12-22: 20 mg

## 2020-12-22 NOTE — Telephone Encounter (Signed)
Noted  

## 2020-12-22 NOTE — Progress Notes (Signed)
She presents today for follow-up of her peroneal tendon repair and her endoscopic fasciotomy as well as hammertoe repair in the second and fifth toes on the right foot.  She states that I know of ever done and I know I brought this on myself but it still hurts.  She states that she thinks she overdid it while she was moving homes and she thinks she is overdone it by taking care of her grandchild.  She states that she has not been able to be off of this and after the visit before last she felt a severe pop in the lateral ankle and she is worried that she may have damaged the repair.  Objective: Vital signs are stable she is alert and oriented x3 presents today with her Tri-Lock brace on states that she has not been wearing her boot as I had suggested.  She has a swollen right ankle with pain on palpation of the peroneal tendons where the repair was performed.  She also has allodynic type symptoms of the sural nerve with neuralgia to the fifth digit right foot.  Second digit where the pin was pulled out prematurely has left the second toe in a slightly plantarflexed and swollen attitude.  It is tender around the PIPJ.  Assessment probable rupture of the peroneal tendon repair.  Plan: We will request the MRI and I am requesting a new boot for her to wear over the next few weeks.  Surgery will be necessary again most likely.  Looking for diagnostic information.

## 2021-01-17 ENCOUNTER — Other Ambulatory Visit: Payer: Self-pay | Admitting: Podiatry

## 2021-01-18 ENCOUNTER — Other Ambulatory Visit: Payer: Self-pay | Admitting: Physician Assistant

## 2021-01-20 ENCOUNTER — Telehealth: Payer: Self-pay | Admitting: Podiatry

## 2021-01-20 NOTE — Telephone Encounter (Signed)
Pre-service center has requested prior authorization for MRI for the following patient that's scheduled for 01/22/21, Please Advise  [facility] [phone] EXT [phone]  Fax [phone]

## 2021-01-20 NOTE — Telephone Encounter (Signed)
Hey since [place] is not going to be here tomorrow could you see about getting this prior authorization done?  Thanks.

## 2021-01-20 NOTE — Telephone Encounter (Signed)
What am I supposed to do with this

## 2021-01-22 ENCOUNTER — Ambulatory Visit: Payer: BC Managed Care – PPO

## 2021-01-23 ENCOUNTER — Other Ambulatory Visit: Payer: Self-pay | Admitting: Physician Assistant

## 2021-01-24 NOTE — Telephone Encounter (Signed)
I submitted the Authorization. I will update everyone when it has been approved.

## 2021-01-25 NOTE — Telephone Encounter (Signed)
Making sure you got this.  I cant read it.

## 2021-01-28 ENCOUNTER — Other Ambulatory Visit: Payer: Self-pay | Admitting: Physician Assistant

## 2021-01-28 NOTE — Telephone Encounter (Signed)
Wasn't sure if she is getting this?

## 2021-01-30 ENCOUNTER — Other Ambulatory Visit: Payer: Self-pay | Admitting: Physician Assistant

## 2021-02-01 ENCOUNTER — Other Ambulatory Visit: Payer: Self-pay

## 2021-02-01 ENCOUNTER — Ambulatory Visit
Admission: RE | Admit: 2021-02-01 | Discharge: 2021-02-01 | Disposition: A | Discharge status: Home / Self Care | Payer: BC Managed Care – PPO | Source: Ambulatory Visit | Attending: Podiatry | Admitting: Podiatry

## 2021-02-01 DIAGNOSIS — S86311D Strain of muscle(s) and tendon(s) of peroneal muscle group at lower leg level, right leg, subsequent encounter: Secondary | ICD-10-CM | POA: Insufficient documentation

## 2021-02-14 ENCOUNTER — Encounter: Payer: Self-pay | Admitting: Podiatry

## 2021-02-14 ENCOUNTER — Ambulatory Visit (INDEPENDENT_AMBULATORY_CARE_PROVIDER_SITE_OTHER): Payer: BC Managed Care – PPO | Admitting: Podiatry

## 2021-02-14 ENCOUNTER — Other Ambulatory Visit: Payer: Self-pay

## 2021-02-14 ENCOUNTER — Ambulatory Visit: Payer: Medicare Other | Admitting: Podiatry

## 2021-02-14 DIAGNOSIS — M79671 Pain in right foot: Secondary | ICD-10-CM

## 2021-02-14 DIAGNOSIS — S86311S Strain of muscle(s) and tendon(s) of peroneal muscle group at lower leg level, right leg, sequela: Secondary | ICD-10-CM | POA: Diagnosis not present

## 2021-02-14 NOTE — Progress Notes (Signed)
She presents today for her surgical consult regarding her right foot.  She states that seems to be getting worse.  Objective: Vital signs are stable she is alert oriented x3 pulses are palpable right foot.  Considerable mild swelling along the lateral malleolus and pain on palpation of the peroneal tendons.  She still has hammertoe deformity second right that failed after attempted fusion as well as pain on the plantar aspect of the right foot at the plantar fascial cannula surgical site.  MRI does demonstrate plan fasciitis of the central cord of the plantar fascia as well as an increase in the tear of the peroneus brevis right.  Assessment: Tear of the peroneus brevis tear of the central band of the plantar fascia and hammertoe second right.  Plan: Consent her today for a total endoscopic plantar fasciotomy a redo repair of her peroneus brevis with possible anastomosis or tenodesis of the peroneus longus.  Also a hammertoe repair.  Discussed the cast and the possible need for that she understands this is amenable to it she understands with complications are the same as previous however delayed healing may be possible at this point states since that is a redo.  Numbness and loss of digit loss of limb loss of life.  Follow-up with her in the near future for surgical intervention.

## 2021-02-18 ENCOUNTER — Telehealth: Payer: Self-pay | Admitting: Urology

## 2021-02-18 NOTE — Telephone Encounter (Addendum)
DOS - 03/18/21  EPF RIGHT --- 50354 REPAIR TENDON RIGHT --- 28086 HAMMERTOE REPAIR 2ND RIGHT --- 65681   [employer] EFFECTIVE DATE - 09/25/20  PLAN DEDUCTIBLE - $1,250.00 W/ $0.00 REMAINING OUT OF POCKET - $4,000.00 W/ $1,664.00 REMAINING COINSURANCE - 0% COPAY - $0.00   SPOKE WITH MARJORIE WITH [employer] AND SHE STATED CPT CODES 27517, [phone] AND [phone] Aretta Nip, AUTH # [id number].  REF # [id number]

## 2021-02-21 ENCOUNTER — Other Ambulatory Visit: Payer: Self-pay | Admitting: Physician Assistant

## 2021-03-09 ENCOUNTER — Other Ambulatory Visit: Payer: Self-pay

## 2021-03-09 ENCOUNTER — Telehealth: Payer: Self-pay | Admitting: Physician Assistant

## 2021-03-09 MED ORDER — AMPHETAMINE-DEXTROAMPHET ER 25 MG PO CP24: 25.0000 mg | ORAL_CAPSULE | Freq: Every day | ORAL | 0 refills | Status: DC

## 2021-03-09 NOTE — Telephone Encounter (Signed)
pended

## 2021-03-09 NOTE — Telephone Encounter (Signed)
Pt called and would like a refill on her adderall xr 30 mg and 25 mg to be sent to [facility] [address] in [place]

## 2021-03-17 ENCOUNTER — Other Ambulatory Visit: Payer: Self-pay | Admitting: Podiatry

## 2021-03-17 MED ORDER — CEPHALEXIN 500 MG PO CAPS: 500.0000 mg | ORAL_CAPSULE | Freq: Three times a day (TID) | ORAL | 0 refills | Status: DC

## 2021-03-17 MED ORDER — ONDANSETRON HCL 4 MG PO TABS: 4.0000 mg | ORAL_TABLET | Freq: Three times a day (TID) | ORAL | 0 refills | Status: DC | PRN

## 2021-03-17 MED ORDER — OXYCODONE-ACETAMINOPHEN 10-325 MG PO TABS: 1.0000 | ORAL_TABLET | Freq: Three times a day (TID) | ORAL | 0 refills | Status: AC | PRN

## 2021-03-18 DIAGNOSIS — S86311S Strain of muscle(s) and tendon(s) of peroneal muscle group at lower leg level, right leg, sequela: Secondary | ICD-10-CM

## 2021-03-18 DIAGNOSIS — M722 Plantar fascial fibromatosis: Secondary | ICD-10-CM

## 2021-03-18 DIAGNOSIS — M2041 Other hammer toe(s) (acquired), right foot: Secondary | ICD-10-CM

## 2021-03-19 ENCOUNTER — Encounter: Payer: Self-pay | Admitting: Podiatry

## 2021-03-21 MED ORDER — FLUCONAZOLE 150 MG PO TABS: 150.0000 mg | ORAL_TABLET | Freq: Once | ORAL | 1 refills | Status: AC

## 2021-03-23 ENCOUNTER — Encounter: Payer: Self-pay | Admitting: Podiatry

## 2021-03-23 ENCOUNTER — Ambulatory Visit (INDEPENDENT_AMBULATORY_CARE_PROVIDER_SITE_OTHER): Payer: BC Managed Care – PPO

## 2021-03-23 ENCOUNTER — Ambulatory Visit (INDEPENDENT_AMBULATORY_CARE_PROVIDER_SITE_OTHER): Payer: Medicare Other | Admitting: Podiatry

## 2021-03-23 ENCOUNTER — Other Ambulatory Visit: Payer: Self-pay

## 2021-03-23 VITALS — BP 158/78 | HR 88 | Temp 98.2°F

## 2021-03-23 DIAGNOSIS — M722 Plantar fascial fibromatosis: Secondary | ICD-10-CM

## 2021-03-23 DIAGNOSIS — S86311S Strain of muscle(s) and tendon(s) of peroneal muscle group at lower leg level, right leg, sequela: Secondary | ICD-10-CM

## 2021-03-23 DIAGNOSIS — Z9889 Other specified postprocedural states: Secondary | ICD-10-CM

## 2021-03-23 DIAGNOSIS — M2041 Other hammer toe(s) (acquired), right foot: Secondary | ICD-10-CM

## 2021-03-23 MED ORDER — OXYCODONE-ACETAMINOPHEN 10-325 MG PO TABS: 1.0000 | ORAL_TABLET | Freq: Three times a day (TID) | ORAL | 0 refills | Status: AC | PRN

## 2021-03-23 NOTE — Progress Notes (Signed)
She presents today for her first postop visit date of surgery 03/18/2021 with repair of peroneal brevis tendon a hammertoe repair and a total endoscopic fasciotomy.  She denies fever chills nausea vomiting muscle aches and pains other than pain in the foot.  She also had a catheter that was placed and it appears that she had a contact dermatitis with blistering of the tape around the catheter.  Objective: Vital signs are stable she is alert and oriented x3.  Popliteal pulse is palpable behind the knee.  There is no calf pain.  The cast is loose proximally.  She has good digital temperatures.  She has good range of motion of the toes and she has sensation.  Assessment: Well-healing surgical foot x1 week.  Plan: Encouraged her to place small amount of Silvadene cream or triple antibiotic ointment on the blisters around her area on her thigh and I will follow-up with her in 1 week for cast removal and reapplication.

## 2021-03-29 ENCOUNTER — Telehealth: Payer: Self-pay | Admitting: Physician Assistant

## 2021-03-29 NOTE — Telephone Encounter (Signed)
Pt called and said that she needs a refill on her adderall xr 30 mg to be sent to the [facility] [address] in [place]

## 2021-03-30 ENCOUNTER — Encounter: Payer: Self-pay | Admitting: Podiatry

## 2021-03-30 ENCOUNTER — Ambulatory Visit (INDEPENDENT_AMBULATORY_CARE_PROVIDER_SITE_OTHER): Payer: BC Managed Care – PPO | Admitting: Podiatry

## 2021-03-30 ENCOUNTER — Other Ambulatory Visit: Payer: Self-pay

## 2021-03-30 DIAGNOSIS — M722 Plantar fascial fibromatosis: Secondary | ICD-10-CM | POA: Diagnosis not present

## 2021-03-30 DIAGNOSIS — Z9889 Other specified postprocedural states: Secondary | ICD-10-CM

## 2021-03-30 DIAGNOSIS — M2041 Other hammer toe(s) (acquired), right foot: Secondary | ICD-10-CM | POA: Diagnosis not present

## 2021-03-30 DIAGNOSIS — S86311S Strain of muscle(s) and tendon(s) of peroneal muscle group at lower leg level, right leg, sequela: Secondary | ICD-10-CM | POA: Diagnosis not present

## 2021-03-30 MED ORDER — AMPHETAMINE-DEXTROAMPHET ER 30 MG PO CP24: 30.0000 mg | ORAL_CAPSULE | Freq: Every day | ORAL | 0 refills | Status: DC

## 2021-03-30 MED ORDER — OXYCODONE-ACETAMINOPHEN 10-325 MG PO TABS: 1.0000 | ORAL_TABLET | Freq: Three times a day (TID) | ORAL | 0 refills | Status: AC | PRN

## 2021-03-30 NOTE — Telephone Encounter (Signed)
Pended.

## 2021-03-30 NOTE — Progress Notes (Signed)
She presents today date of surgery 03/18/2021 repair peroneal brevis tendon and total endoscopic fasciotomy as well as a hammertoe repair.  She states that it feels so much better with the cast off than it did prior.  We just remove that a few minutes ago.  She states that she is able to move her foot and toes without pain.  Objective: Vital signs are stable alert oriented x3 pulses are palpable.  Cast was intact was removed reveals no erythema edema cellulitis drainage or odor sutures are intact staples are intact margins appear to be coapting.  Assessment: Well-healing surgical foot right.  Plan: Redressed today dressed a compressive dressing recasted today Short leg cast nonweightbearing.  Follow-up with me in 2 weeks at which time Dr. Lilian Kapur will remove the cast and staples and sutures placed her in a cam boot remain nonweightbearing until I follow-up with her 2 weeks after that.

## 2021-04-06 ENCOUNTER — Encounter: Payer: Self-pay | Admitting: Podiatry

## 2021-04-07 ENCOUNTER — Telehealth: Payer: Self-pay | Admitting: Physician Assistant

## 2021-04-07 ENCOUNTER — Other Ambulatory Visit: Payer: Self-pay

## 2021-04-07 ENCOUNTER — Telehealth: Payer: Self-pay | Admitting: Podiatry

## 2021-04-07 MED ORDER — OXYCODONE-ACETAMINOPHEN 10-325 MG PO TABS: 1.0000 | ORAL_TABLET | Freq: Three times a day (TID) | ORAL | 0 refills | Status: DC | PRN

## 2021-04-07 MED ORDER — ONDANSETRON HCL 4 MG PO TABS: 4.0000 mg | ORAL_TABLET | Freq: Three times a day (TID) | ORAL | 0 refills | Status: DC | PRN

## 2021-04-07 NOTE — Telephone Encounter (Signed)
Pt  requesting refill for Adderall XR 25 mg @ [facility]. APT [address]/20

## 2021-04-07 NOTE — Telephone Encounter (Signed)
pended

## 2021-04-07 NOTE — Addendum Note (Signed)
Addended by: Ventura Sellers on: 04/07/2021 05:07 PM   Modules accepted: Orders

## 2021-04-07 NOTE — Telephone Encounter (Signed)
Patient called our office wanting of oxycodone and Zofran sent to the [facility] [place] -[address], [place], [place] [address].

## 2021-04-08 ENCOUNTER — Telehealth: Payer: Self-pay | Admitting: *Deleted

## 2021-04-08 MED ORDER — AMPHETAMINE-DEXTROAMPHET ER 25 MG PO CP24: 25.0000 mg | ORAL_CAPSULE | Freq: Every day | ORAL | 0 refills | Status: DC

## 2021-04-08 NOTE — Telephone Encounter (Signed)
"  I'm a patient of Dr. Al Corpus.  I'm calling to see if it would be possible for Dr. Al Corpus to send a refill of my Oxycodone and my Zofran to [facility] in [place].  Please give me a call back."

## 2021-04-08 NOTE — Telephone Encounter (Signed)
This was previously sent, please inform

## 2021-04-08 NOTE — Telephone Encounter (Signed)
I attempted to follow-up with the patient to see if she got the medication.  I left her a message that Dr. Samuella Cota sent the prescriptions to the pharmacy yesterday.

## 2021-04-13 ENCOUNTER — Ambulatory Visit (INDEPENDENT_AMBULATORY_CARE_PROVIDER_SITE_OTHER): Payer: BC Managed Care – PPO | Admitting: Podiatry

## 2021-04-13 ENCOUNTER — Other Ambulatory Visit: Payer: Self-pay

## 2021-04-13 DIAGNOSIS — Z9889 Other specified postprocedural states: Secondary | ICD-10-CM | POA: Diagnosis not present

## 2021-04-13 DIAGNOSIS — M2041 Other hammer toe(s) (acquired), right foot: Secondary | ICD-10-CM

## 2021-04-13 DIAGNOSIS — M722 Plantar fascial fibromatosis: Secondary | ICD-10-CM

## 2021-04-13 DIAGNOSIS — S86311S Strain of muscle(s) and tendon(s) of peroneal muscle group at lower leg level, right leg, sequela: Secondary | ICD-10-CM | POA: Diagnosis not present

## 2021-04-13 MED ORDER — OXYCODONE-ACETAMINOPHEN 10-325 MG PO TABS: 1.0000 | ORAL_TABLET | Freq: Two times a day (BID) | ORAL | 0 refills | Status: DC

## 2021-04-13 NOTE — Progress Notes (Signed)
She has a  Subjective:  Patient ID: Katrina Booker, female    DOB: 1976-06-28,  MRN: [id number]  Chief Complaint  Patient presents with   Routine Post Op      POV #3 DOS 03/18/2021 REPAIR PERONEAL BREVIS TENDON, POSS TENODESIS TO PERONEUS LONGUS TENDON, POSS TOTAL EPF RT, HAMMERTOE REPAIR 2ND RT, CAST     45 y.o. female returns for post-op check.  Doing well she still has to take the pain medication twice a day.  She request more of it today.  She had some difficulty staying off of it and had to drive to the appointment today because her husband was not available to help her  Review of Systems: Negative except as noted in the HPI. Denies N/V/F/Ch.   Objective:  There were no vitals filed for this visit. There is no height or weight on file to calculate BMI. Constitutional Well developed. Well nourished.  Vascular Foot warm and well perfused. Capillary refill normal to all digits.   Neurologic Normal speech. Oriented to person, place, and time. Epicritic sensation to light touch grossly present bilaterally.  Dermatologic Skin healing well without signs of infection. Skin edges well coapted without signs of infection.  Orthopedic: Tenderness to palpation noted about the surgical site.    Assessment:   1. Plantar fasciitis of right foot   2. Hammer toe of right foot   3. Tear of peroneal tendon, right, sequela   4. S/P foot surgery, right    Plan:  Patient was evaluated and treated and all questions answered.  S/p foot surgery  Sutures and staples are removed without incident her incisions are well-healed.  I advised her she may be able to bathe them now and apply lotion but she cannot put any weight on this.  Cast was removed and she was placed into a cam boot.  Advised to treat like a cast and remain completely nonweightbearing.  Emphasized the importance of this.  Advised not to drive during this period.  Avoid range of motion at this point until directed by Dr.  Al Corpus.  I did refill her pain medication for a final time for twice daily for 2 weeks.  She think she will not need any further from this.  I discussed this with her.  She will transition to Tylenol Motrin by the next visit.  Return in about 2 weeks (around 04/27/2021) for with Dr Al Corpus.

## 2021-04-13 NOTE — Patient Instructions (Signed)
Continue using the crutches with the boot, do not put any weight on it. Do not drive with the boot. Take the pain medication only as needed. Plan to transition to just ibuprofen and tylenol by the next visit

## 2021-04-27 ENCOUNTER — Encounter: Payer: Self-pay | Admitting: Podiatry

## 2021-04-27 ENCOUNTER — Ambulatory Visit (INDEPENDENT_AMBULATORY_CARE_PROVIDER_SITE_OTHER): Payer: BC Managed Care – PPO | Admitting: Podiatry

## 2021-04-27 ENCOUNTER — Other Ambulatory Visit: Payer: Self-pay

## 2021-04-27 DIAGNOSIS — M722 Plantar fascial fibromatosis: Secondary | ICD-10-CM

## 2021-04-27 DIAGNOSIS — S86311S Strain of muscle(s) and tendon(s) of peroneal muscle group at lower leg level, right leg, sequela: Secondary | ICD-10-CM | POA: Diagnosis not present

## 2021-04-27 DIAGNOSIS — Z9889 Other specified postprocedural states: Secondary | ICD-10-CM

## 2021-04-27 DIAGNOSIS — M2041 Other hammer toe(s) (acquired), right foot: Secondary | ICD-10-CM | POA: Diagnosis not present

## 2021-04-27 MED ORDER — OXYCODONE-ACETAMINOPHEN 10-325 MG PO TABS: 1.0000 | ORAL_TABLET | Freq: Two times a day (BID) | ORAL | 0 refills | Status: AC

## 2021-04-27 MED ORDER — TRIAMCINOLONE ACETONIDE 40 MG/ML IJ SUSP
20.0000 mg | Freq: Once | INTRAMUSCULAR | Status: AC
  Administered 2021-04-27: 20 mg

## 2021-04-27 NOTE — Progress Notes (Signed)
She presents today date of surgery 624 2022-second repair of her peroneal brevis tendon and peroneal longus.  She also had a total endoscopic fasciotomy she also had a hammertoe repair with screw second right.  Denies fever chills nausea vomiting muscle aches and pains states that she has this been hurting a lot at nighttime and would like to have a refill of her pain medication for the nighttime.  She presents today utilizing her crutches in a nonweightbearing fashion.  Objective: Ace wrap once removed demonstrates minimal edema about the right lower extremity the second toe appears to be most edematous.  There is no erythema cellulitis drainage or odor skin good range of motion of the ankle joint and of the second toe at the metatarsal phalangeal joint.  Assessment: Well-healing surgical foot leg.  Plan: I refilled her pain medication placed her in a new Ace wrap and back in her sock.  I will follow-up with her in 2 weeks I instructed her to start partial weightbearing at this point.  She will stop with partial weightbearing if the pain is too intense.  Questions or concerns she will notify us immediately.

## 2021-05-11 ENCOUNTER — Encounter: Payer: Self-pay | Admitting: Podiatry

## 2021-05-11 ENCOUNTER — Other Ambulatory Visit: Payer: Self-pay

## 2021-05-11 ENCOUNTER — Ambulatory Visit (INDEPENDENT_AMBULATORY_CARE_PROVIDER_SITE_OTHER): Payer: Medicare Other | Admitting: Podiatry

## 2021-05-11 DIAGNOSIS — S86311S Strain of muscle(s) and tendon(s) of peroneal muscle group at lower leg level, right leg, sequela: Secondary | ICD-10-CM

## 2021-05-11 DIAGNOSIS — M2041 Other hammer toe(s) (acquired), right foot: Secondary | ICD-10-CM

## 2021-05-11 DIAGNOSIS — Z9889 Other specified postprocedural states: Secondary | ICD-10-CM

## 2021-05-11 DIAGNOSIS — M722 Plantar fascial fibromatosis: Secondary | ICD-10-CM

## 2021-05-11 NOTE — Progress Notes (Signed)
She presents today date of surgery 03/18/2021 repair peroneal tendon redo as well as a endoscopic fasciotomy and a hammertoe repair.  She states that she is doing okay and presents today with her cam walker and a single crutch.  Objective: Vital signs stable alert oriented x3 mild tenderness on palpation of the peroneal tendon scar.  The second toe in the fasciitis is doing very well.  She has good abduction and abduction and eversion.  Assessment: Well-healing surgical foot.  Plan: At this point I want her to go into a Tri-Lock brace which she already has at home with a regular tennis shoe and a going to send her for physical therapy.

## 2021-05-23 ENCOUNTER — Telehealth: Payer: Self-pay | Admitting: Physician Assistant

## 2021-05-23 NOTE — Telephone Encounter (Signed)
Next visit is 06/14/21. Requesting refill on Generic Adderall XR 30 mg called to:  [id number] [place], [place] - [address] AT [facility] [address] [facility]  Phone:  [phone]  Fax:  [phone]

## 2021-05-23 NOTE — Telephone Encounter (Signed)
Due 8/31.

## 2021-05-25 ENCOUNTER — Other Ambulatory Visit: Payer: Self-pay

## 2021-05-25 MED ORDER — AMPHETAMINE-DEXTROAMPHET ER 30 MG PO CP24: 30.0000 mg | ORAL_CAPSULE | Freq: Every day | ORAL | 0 refills | Status: DC

## 2021-05-25 NOTE — Telephone Encounter (Signed)
Pended.

## 2021-05-29 ENCOUNTER — Other Ambulatory Visit: Payer: Self-pay | Admitting: Physician Assistant

## 2021-06-02 ENCOUNTER — Other Ambulatory Visit: Payer: Self-pay | Admitting: Podiatry

## 2021-06-02 ENCOUNTER — Telehealth: Payer: Self-pay | Admitting: Physician Assistant

## 2021-06-02 NOTE — Telephone Encounter (Signed)
Last filled 8/10 appt on 9/20

## 2021-06-02 NOTE — Telephone Encounter (Signed)
Pt requesting Adderall XR 25 mg GENERIC @ [facility]. Last month only filled #25. Will run out 5 days early.

## 2021-06-02 NOTE — Telephone Encounter (Signed)
Please call her pharmacy and see if they have the Adderall in stock before we send anything.  Also confirmed whether she got 25 or not.  Thank you

## 2021-06-02 NOTE — Telephone Encounter (Signed)
Please review and let me know what I should pend since she will be out early

## 2021-06-03 ENCOUNTER — Other Ambulatory Visit: Payer: Self-pay | Admitting: Physician Assistant

## 2021-06-03 MED ORDER — AMPHETAMINE-DEXTROAMPHET ER 25 MG PO CP24: 25.0000 mg | ORAL_CAPSULE | Freq: Every day | ORAL | 0 refills | Status: DC

## 2021-06-03 NOTE — Telephone Encounter (Signed)
Prescription was sent

## 2021-06-03 NOTE — Telephone Encounter (Signed)
The pharmacy confirmed she did receive #25 tabs and they do have the med in stock

## 2021-06-03 NOTE — Telephone Encounter (Signed)
Please Advise

## 2021-06-04 ENCOUNTER — Other Ambulatory Visit: Payer: Self-pay | Admitting: Physician Assistant

## 2021-06-06 NOTE — Telephone Encounter (Signed)
Last filled 01/28/21

## 2021-06-08 ENCOUNTER — Encounter: Payer: Self-pay | Admitting: Podiatry

## 2021-06-14 ENCOUNTER — Encounter: Payer: Self-pay | Admitting: Physician Assistant

## 2021-06-14 ENCOUNTER — Ambulatory Visit (INDEPENDENT_AMBULATORY_CARE_PROVIDER_SITE_OTHER): Payer: BC Managed Care – PPO | Admitting: Physician Assistant

## 2021-06-14 DIAGNOSIS — F3341 Major depressive disorder, recurrent, in partial remission: Secondary | ICD-10-CM | POA: Diagnosis not present

## 2021-06-14 DIAGNOSIS — F900 Attention-deficit hyperactivity disorder, predominantly inattentive type: Secondary | ICD-10-CM | POA: Diagnosis not present

## 2021-06-14 DIAGNOSIS — F17200 Nicotine dependence, unspecified, uncomplicated: Secondary | ICD-10-CM

## 2021-06-14 DIAGNOSIS — F411 Generalized anxiety disorder: Secondary | ICD-10-CM | POA: Diagnosis not present

## 2021-06-14 DIAGNOSIS — Z79899 Other long term (current) drug therapy: Secondary | ICD-10-CM

## 2021-06-14 MED ORDER — AMPHETAMINE-DEXTROAMPHET ER 30 MG PO CP24: 30.0000 mg | ORAL_CAPSULE | Freq: Every day | ORAL | 0 refills | Status: DC

## 2021-06-14 MED ORDER — BUSPIRONE HCL 30 MG PO TABS: ORAL_TABLET | ORAL | 11 refills | Status: DC

## 2021-06-14 MED ORDER — LAMOTRIGINE 200 MG PO TABS: 200.0000 mg | ORAL_TABLET | Freq: Two times a day (BID) | ORAL | 11 refills | Status: DC

## 2021-06-14 MED ORDER — VARENICLINE TARTRATE 1 MG PO TABS: 1.0000 mg | ORAL_TABLET | Freq: Two times a day (BID) | ORAL | 11 refills | Status: DC

## 2021-06-14 MED ORDER — AMPHETAMINE-DEXTROAMPHET ER 25 MG PO CP24: 25.0000 mg | ORAL_CAPSULE | Freq: Every day | ORAL | 0 refills | Status: DC

## 2021-06-14 MED ORDER — CLONAZEPAM 0.5 MG PO TABS: ORAL_TABLET | ORAL | 5 refills | Status: DC

## 2021-06-14 MED ORDER — OLANZAPINE 15 MG PO TABS: ORAL_TABLET | ORAL | 11 refills | Status: DC

## 2021-06-14 MED ORDER — ZOLPIDEM TARTRATE ER 12.5 MG PO TBCR: EXTENDED_RELEASE_TABLET | ORAL | 5 refills | Status: DC

## 2021-06-14 MED ORDER — FLUOXETINE HCL 40 MG PO CAPS: ORAL_CAPSULE | ORAL | 11 refills | Status: DC

## 2021-06-14 NOTE — Progress Notes (Signed)
[facility]  Patient ID: Katrina Booker,  MRN: [id number]  PCP: Dorothey Baseman, MD  Date of Evaluation: 06/14/2021 Time spent:30 minutes  Chief Complaint:  Chief Complaint   Anxiety; ADD; Depression; Insomnia; Follow-up    Virtual Visit via Telehealth  I connected with patient by telephone, with their informed consent, and verified patient privacy and that I am speaking with the correct person using two identifiers.  I am private, in my office and the patient is at home.  I discussed the limitations, risks, security and privacy concerns of performing an evaluation and management service by telephone and the availability of in person appointments. I also discussed with the patient that there may be a patient responsible charge related to this service. The patient expressed understanding and agreed to proceed.   I discussed the assessment and treatment plan with the patient. The patient was provided an opportunity to ask questions and all were answered. The patient agreed with the plan and demonstrated an understanding of the instructions.   The patient was advised to call back or seek an in-person evaluation if the symptoms worsen or if the condition fails to improve as anticipated.  I provided 30 minutes of non-face-to-face time during this encounter.   HISTORY/CURRENT STATUS: HPI for routine med check.  She has a fever today so had to change from an in person visit to telehealth.  Woke up with it and has nasal congestion.  She will be going later today to get a COVID test.  As far as her mental health goes she states she is doing well.  No major mood swings.Patient denies loss of interest in usual activities and is able to enjoy things.  Denies decreased energy or motivation.  Appetite has not changed.  No extreme sadness, tearfulness, or feelings of hopelessness. Denies suicidal or homicidal thoughts.  She sleeps well most of the time.  Anxiety is still a  problem, circumstantially.  Klonopin does help.  She tries not to take it if at all possible.  She has cut back on smoking down to maybe 1/3 pack.  She is still taking the Chantix and feels like it does help prevent the cravings.  No side effects from it.  States that attention is good without easy distractibility.  Able to focus on things and finish tasks to completion.   Patient denies increased energy with decreased need for sleep, no increased talkativeness, no racing thoughts, no impulsivity or risky behaviors, no increased spending, no increased libido, no grandiosity.  No paranoia, and no hallucinations.  Denies dizziness, syncope, seizures, numbness, tingling, tremor, tics, unsteady gait, slurred speech, confusion. Denies muscle or joint pain, stiffness, or dystonia.  Individual Medical History/ Review of Systems: Changes? :Yes   had ankle surgery, also has a fever today  Past medications for mental health diagnoses include: Chantix, Lamictal, BuSpar, symbyax, Zyprexa, Xanax, Latuda, Effexor, Sonata, Ambien, Prozac, Librium, Wellbutrin, Adderall XR, Cymbalta, Hydroxyzine, Klonopin, Zoloft  Allergies: Betadine [povidone iodine], Ketorolac, Amlodipine, Hydrocodone, and Tramadol  Current Medications:  Current Outpatient Medications:    albuterol (PROVENTIL HFA;VENTOLIN HFA) 108 (90 BASE) MCG/ACT inhaler, Inhale 2 puffs into the lungs every 6 (six) hours as needed. For ashtma, Disp: , Rfl:    [START ON 08/30/2021] amphetamine-dextroamphetamine (ADDERALL XR) 25 MG 24 hr capsule, Take 1 capsule by mouth daily with lunch., Disp: 30 capsule, Rfl: 0   [START ON 08/01/2021] amphetamine-dextroamphetamine (ADDERALL XR) 25 MG 24 hr capsule, Take 1 capsule by mouth daily after lunch.,  Disp: 30 capsule, Rfl: 0   [START ON 07/02/2021] amphetamine-dextroamphetamine (ADDERALL XR) 25 MG 24 hr capsule, Take 1 capsule by mouth daily with lunch., Disp: 30 capsule, Rfl: 0   [START ON 08/24/2021]  amphetamine-dextroamphetamine (ADDERALL XR) 30 MG 24 hr capsule, Take 1 capsule (30 mg total) by mouth daily., Disp: 30 capsule, Rfl: 0   [START ON 07/25/2021] amphetamine-dextroamphetamine (ADDERALL XR) 30 MG 24 hr capsule, Take 1 capsule (30 mg total) by mouth daily., Disp: 30 capsule, Rfl: 0   [START ON 06/25/2021] amphetamine-dextroamphetamine (ADDERALL XR) 30 MG 24 hr capsule, Take 1 capsule (30 mg total) by mouth daily., Disp: 30 capsule, Rfl: 0   Dulaglutide (TRULICITY) 0.75 MG/0.5ML SOPN, ADMINISTER 0.75 MG UNDER THE SKIN 1 TIME A WEEK, Disp: , Rfl:    fexofenadine (ALLEGRA) 180 MG tablet, Take 180 mg by mouth daily. , Disp: , Rfl:    gabapentin (NEURONTIN) 400 MG capsule, Take 400 mg by mouth. Up to qid prn, Disp: , Rfl:    gabapentin (NEURONTIN) 800 MG tablet, 800 mg 3 (three) times daily. , Disp: , Rfl:    lisinopril-hydrochlorothiazide (ZESTORETIC) 20-12.5 MG tablet, Take 1 tablet by mouth every morning., Disp: , Rfl:    meloxicam (MOBIC) 15 MG tablet, TAKE 1 TABLET(15 MG) BY MOUTH DAILY, Disp: 90 tablet, Rfl: 3   metFORMIN (GLUCOPHAGE) 500 MG tablet, Take 500 mg by mouth 2 (two) times daily with a meal., Disp: , Rfl:    Omega-3 1000 MG CAPS, Take by mouth., Disp: , Rfl:    ondansetron (ZOFRAN) 4 MG tablet, TAKE 1 TABLET(4 MG) BY MOUTH EVERY 8 HOURS AS NEEDED, Disp: 20 tablet, Rfl: 0   pantoprazole (PROTONIX) 40 MG tablet, Take by mouth., Disp: , Rfl:    potassium chloride (K-DUR) 10 MEQ tablet, TAKE 1 TABLET(10 MEQ) BY MOUTH EVERY DAY, Disp: , Rfl:    promethazine (PHENERGAN) 25 MG tablet, TAKE 1 TABLET BY MOUTH EVERY 4 HOURS AS NEEDED FOR NAUSEA, Disp: , Rfl:    propranolol (INDERAL) 10 MG tablet, Take by mouth. Up to 5 qd., Disp: , Rfl:    SUMAtriptan (IMITREX) 100 MG tablet, Take by mouth., Disp: , Rfl:    XIIDRA 5 % SOLN, , Disp: , Rfl:    busPIRone (BUSPAR) 30 MG tablet, TAKE 1 TABLET(30 MG) BY MOUTH THREE TIMES DAILY, Disp: 90 tablet, Rfl: 11   clonazePAM (KLONOPIN) 0.5 MG  tablet, TAKE 1 TABLET(0.5 MG) BY MOUTH DAILY AS NEEDED FOR ANXIETY, Disp: 30 tablet, Rfl: 5   FLUoxetine (PROZAC) 40 MG capsule, TAKE 2 CAPSULES(80 MG) BY MOUTH DAILY, Disp: 60 capsule, Rfl: 11   fluticasone (FLONASE) 50 MCG/ACT nasal spray, Place into both nostrils. (Patient not taking: Reported on 06/14/2021), Disp: , Rfl:    lamoTRIgine (LAMICTAL) 200 MG tablet, Take 1 tablet (200 mg total) by mouth 2 (two) times daily., Disp: 60 tablet, Rfl: 11   LINZESS 145 MCG CAPS capsule, Take 145 mcg by mouth daily. (Patient not taking: Reported on 06/14/2021), Disp: , Rfl:    OLANZapine (ZYPREXA) 15 MG tablet, TAKE 1 TABLET(15 MG) BY MOUTH AT BEDTIME, Disp: 30 tablet, Rfl: 11   varenicline (CHANTIX) 1 MG tablet, Take 1 tablet (1 mg total) by mouth 2 (two) times daily., Disp: 60 tablet, Rfl: 11   VYLEESI 1.75 MG/0.3ML SOAJ, Inject into the skin. (Patient not taking: Reported on 06/14/2021), Disp: , Rfl:    zolpidem (AMBIEN CR) 12.5 MG CR tablet, TAKE 1 TABLET(12.5 MG) BY MOUTH AT BEDTIME  AS NEEDED FOR SLEEP, Disp: 30 tablet, Rfl: 5 Medication Side Effects: none  Family Medical/ Social History: Changes? No  MENTAL HEALTH EXAM:  There were no vitals taken for this visit.There is no height or weight on file to calculate BMI.  General Appearance:  Unable to assess  Eye Contact:   Unable to assess  Speech:  Clear and Coherent and Normal Rate  Volume:  Normal  Mood:  Euthymic  Affect:   Unable to assess  Thought Process:  Goal Directed and Descriptions of Associations: Intact  Orientation:  Full (Time, Place, and Person)  Thought Content: Logical   Suicidal Thoughts:  No  Homicidal Thoughts:  No  Memory:  WNL  Judgement:  Good  Insight:  Good  Psychomotor Activity:   Unable to assess  Concentration:  Concentration: Good and Attention Span: Good  Recall:  Good  Fund of Knowledge: Good  Language: Good  Assets:  Desire for Improvement  ADL's:  Intact  Cognition: WNL  Prognosis:  Good   Labs from  04/12/2021 reviewed hemoglobin A1c 6.3 takesTrulicity and metformin CBC was normal. CMP was normal Last lipid panel was 10/13/2020  DIAGNOSES:    ICD-10-CM   1. Depression, major, recurrent, in partial remission (HCC)  F33.41     2. Attention deficit hyperactivity disorder (ADHD), predominantly inattentive type  F90.0     3. Generalized anxiety disorder  F41.1     4. Polypharmacy  Z79.899     5. Smoker  F17.200        Receiving Psychotherapy: No    RECOMMENDATIONS:  PDMP was reviewed.  Last Adderall prescriptions were filled 05/27/2021 and 06/03/2021 (she takes XR 25 mg in the afternoon and 30 mg in the mornings.)  Last Ambien filled 06/03/2021.  Last Klonopin 06/02/2021 I provided 30 minutes of non-face-to-face time during this encounter, including time spent before and after the visit in records review, medical decision making, counseling pertinent to today's visit, and charting.  She is doing well as far as her mental health is concerned.  No changes in medications need to be made. We discussed smoking cessation.  Chantix is usually only prescribed for 3 to 6 months, but it is not causing her any side effects and she sees benefit from it with a decreasing cravings so would like to continue it. Continue Adderall XR 30 mg every morning. Continue Adderall XR 25 mg daily at lunch. Continue BuSpar 30 mg 1 p.o. 3 times daily. Continue Klonopin 0.5 mg 1 p.o. daily as needed. Continue Prozac 40 mg, 2 p.o. daily. Continue gabapentin 800 mg, 1 p.o. 3 times daily and 400 mg 4 times daily.   Continue Lamictal 200 mg, 1 p.o. twice daily. Continue Zyprexa 15 mg, nightly. Continue Chantix as directed. Continue Ambien CR 12.5 mg p.o. nightly as needed sleep. Labs are followed closely by another provider.  She has known diabetes.  Return in 6 months.  Melony Overly, PA-C

## 2021-06-22 ENCOUNTER — Ambulatory Visit (INDEPENDENT_AMBULATORY_CARE_PROVIDER_SITE_OTHER): Payer: BC Managed Care – PPO | Admitting: Podiatry

## 2021-06-22 ENCOUNTER — Other Ambulatory Visit: Payer: Self-pay

## 2021-06-22 ENCOUNTER — Encounter: Payer: Self-pay | Admitting: Podiatry

## 2021-06-22 DIAGNOSIS — M722 Plantar fascial fibromatosis: Secondary | ICD-10-CM | POA: Diagnosis not present

## 2021-06-22 DIAGNOSIS — Z9889 Other specified postprocedural states: Secondary | ICD-10-CM | POA: Diagnosis not present

## 2021-06-22 DIAGNOSIS — M2041 Other hammer toe(s) (acquired), right foot: Secondary | ICD-10-CM | POA: Diagnosis not present

## 2021-06-22 DIAGNOSIS — S86311S Strain of muscle(s) and tendon(s) of peroneal muscle group at lower leg level, right leg, sequela: Secondary | ICD-10-CM | POA: Diagnosis not present

## 2021-06-22 MED ORDER — OXYCODONE-ACETAMINOPHEN 10-325 MG PO TABS: 1.0000 | ORAL_TABLET | Freq: Three times a day (TID) | ORAL | 0 refills | Status: AC | PRN

## 2021-06-22 MED ORDER — METHYLPREDNISOLONE 4 MG PO TBPK: ORAL_TABLET | ORAL | 0 refills | Status: DC

## 2021-06-22 NOTE — Progress Notes (Signed)
She presents today date of surgery 03/18/2021 for repeat repair of the peroneus longus tendon and a total endoscopic fasciotomy.  States that she was doing really well not having any pain or any problems whatsoever and all of a sudden last week it got really swollen and really painful.  The pain was intense but has since improved.  Objective: Vital signs are stable she is alert and oriented x3.  She denies trauma.  Right foot demonstrates mild edema laterally but the peroneus tendon is intact.  Assessment well-healing surgical foot mild setback probable tear of scar tissue.  Plan: Started on Medrol Dosepak encouraged RICE therapy and provided her with a few Percocets to help alleviate her symptoms.

## 2021-07-01 ENCOUNTER — Other Ambulatory Visit: Payer: Self-pay

## 2021-07-01 ENCOUNTER — Telehealth: Payer: Self-pay | Admitting: Podiatry

## 2021-07-01 ENCOUNTER — Ambulatory Visit
Admission: EM | Admit: 2021-07-01 | Discharge: 2021-07-01 | Disposition: A | Discharge status: Home / Self Care | Payer: BC Managed Care – PPO | Attending: Emergency Medicine | Admitting: Emergency Medicine

## 2021-07-01 DIAGNOSIS — M25571 Pain in right ankle and joints of right foot: Secondary | ICD-10-CM | POA: Diagnosis not present

## 2021-07-01 MED ORDER — PREDNISONE 10 MG (21) PO TBPK
ORAL_TABLET | ORAL | 0 refills | Status: DC
Start: 2021-07-01 — End: 2021-09-28

## 2021-07-01 MED ORDER — OXYCODONE HCL 5 MG PO TABS: 10.0000 mg | ORAL_TABLET | Freq: Four times a day (QID) | ORAL | 0 refills | Status: DC | PRN

## 2021-07-01 NOTE — ED Provider Notes (Signed)
[facility]    CSN: [id number] Arrival date & time: 07/01/21  1842      History   Chief Complaint Chief Complaint  Patient presents with   Ankle Pain    right   Back Pain    lower    HPI Katrina Booker is a 45 y.o. female.   HPI  45 year old female here for evaluation of right ankle pain.  Patient is here for evaluation of right ankle pain and swelling that has been going on for approximately 3 weeks.  She was evaluated on 06/22/2021 by her podiatrist who gave her a Medrol Dosepak and 21 tablets of 10 mg Percocets.  Patient purse that she was having to take the Percocet 3 times a day to control her pain and is currently out.  She states that the steroids did not help her at all.  She states that she started develop pain in her low back where she has fusions because she has been having to walk with an abnormal gait secondary to the pain.  She is following the RICE therapy guidelines and wearing her compression sleeve and brace.  She is unaware of any numbness or tingling in her toes as she does not have complete return of sensation from previous surgeries and injuries.  Past Medical History:  Diagnosis Date   ADHD (attention deficit hyperactivity disorder)    Asthma    Bipolar 1 disorder (HCC)    Diabetes mellitus    GERD (gastroesophageal reflux disease)     Patient Active Problem List   Diagnosis Date Noted   Anemia 06/09/2020   Hypotension 03/19/2019   Syncope and collapse 03/19/2019   Attention deficit hyperactivity disorder (ADHD) 07/24/2018   Insomnia 07/24/2018   Morbid obesity with BMI of 40.0-44.9, adult (HCC) 06/14/2018   DDD (degenerative disc disease), lumbosacral 03/06/2018   Foraminal stenosis of lumbosacral region 03/06/2018   Low back pain radiating to left lower extremity 03/06/2018   S/P lumbar microdiscectomy 03/06/2018   Spondylosis of cervical region without myelopathy or radiculopathy 11/09/2017   Spondylosis of lumbosacral region  without myelopathy or radiculopathy 11/09/2017   Numbness and tingling in both hands 10/27/2016   Tobacco use 10/27/2016   Asthma without status asthmaticus 04/23/2014   Bipolar 1 disorder (HCC) 04/23/2014   Type 2 diabetes mellitus (HCC) 04/23/2014    Past Surgical History:  Procedure Laterality Date   ABDOMINAL HYSTERECTOMY     ABDOMINAL SURGERY     BACK SURGERY     HERNIA REPAIR     INCONTINENCE SURGERY     OTHER SURGICAL HISTORY     tumor excision on face   TUBAL LIGATION      OB History   No obstetric history on file.      Home Medications    Prior to Admission medications   Medication Sig Start Date End Date Taking? Authorizing Provider  albuterol (PROVENTIL HFA;VENTOLIN HFA) 108 (90 BASE) MCG/ACT inhaler Inhale 2 puffs into the lungs every 6 (six) hours as needed. For ashtma   Yes [provider]  amphetamine-dextroamphetamine (ADDERALL XR) 25 MG 24 hr capsule Take 1 capsule by mouth daily with lunch. 08/30/21  Yes Hurst, Glade Nurse, PA-C  amphetamine-dextroamphetamine (ADDERALL XR) 30 MG 24 hr capsule Take 1 capsule (30 mg total) by mouth daily. 08/24/21  Yes Hurst, [name], PA-C  busPIRone (BUSPAR) 30 MG tablet TAKE 1 TABLET(30 MG) BY MOUTH THREE TIMES DAILY 06/14/21  Yes Melony Overly T, PA-C  clonazePAM (  KLONOPIN) 0.5 MG tablet TAKE 1 TABLET(0.5 MG) BY MOUTH DAILY AS NEEDED FOR ANXIETY 06/14/21  Yes Hurst, [name], PA-C  Dulaglutide (TRULICITY) 0.75 MG/0.5ML SOPN ADMINISTER 0.75 MG UNDER THE SKIN 1 TIME A WEEK 09/05/19  Yes [provider]  fexofenadine (ALLEGRA) 180 MG tablet Take 180 mg by mouth daily.  06/17/18  Yes [provider]  FLUoxetine (PROZAC) 40 MG capsule TAKE 2 CAPSULES(80 MG) BY MOUTH DAILY 06/14/21  Yes Hurst, [name], PA-C  fluticasone (FLONASE) 50 MCG/ACT nasal spray Place into both nostrils. 10/07/20  Yes [provider]  gabapentin (NEURONTIN) 400 MG capsule Take 400 mg by mouth. Up to qid prn 06/19/20  Yes [provider]  gabapentin (NEURONTIN) 800 MG tablet 800 mg 3 (three) times daily.  06/07/18  Yes [provider]  lamoTRIgine (LAMICTAL) 200 MG tablet Take 1 tablet (200 mg total) by mouth 2 (two) times daily. 06/14/21  Yes Cherie Ouch, PA-C  LINZESS 145 MCG CAPS capsule Take 145 mcg by mouth daily. 11/08/19  Yes [provider]  lisinopril-hydrochlorothiazide (ZESTORETIC) 20-12.5 MG tablet Take 1 tablet by mouth every morning. 06/03/20  Yes [provider]  meloxicam (MOBIC) 15 MG tablet TAKE 1 TABLET(15 MG) BY MOUTH DAILY 08/03/20  Yes Hyatt, Max T, DPM  metFORMIN (GLUCOPHAGE) 500 MG tablet Take 500 mg by mouth 2 (two) times daily with a meal.   Yes [provider]  OLANZapine (ZYPREXA) 15 MG tablet TAKE 1 TABLET(15 MG) BY MOUTH AT BEDTIME 06/14/21  Yes Hurst, [name], PA-C  Omega-3 1000 MG CAPS Take by mouth.   Yes [provider]  ondansetron (ZOFRAN) 4 MG tablet TAKE 1 TABLET(4 MG) BY MOUTH EVERY 8 HOURS AS NEEDED 06/06/21  Yes Park Liter, DPM  oxyCODONE (ROXICODONE) 5 MG immediate release tablet Take 2 tablets (10 mg total) by mouth every 6 (six) hours as needed for severe pain. 07/01/21  Yes Becky Augusta, NP  pantoprazole (PROTONIX) 40 MG tablet Take by mouth. 07/28/19  Yes [provider]  potassium chloride (K-DUR) 10 MEQ tablet TAKE 1 TABLET(10 MEQ) BY MOUTH EVERY DAY 06/07/18  Yes [provider]  predniSONE (STERAPRED UNI-PAK 21 TAB) 10 MG (21) TBPK tablet Take 6 tablets on day 1, 5 tablets day 2, 4 tablets day 3, 3 tablets day 4, 2 tablets day 5, 1 tablet day 6 07/01/21  Yes Becky Augusta, NP  promethazine (PHENERGAN) 25 MG tablet TAKE 1 TABLET BY MOUTH EVERY 4 HOURS AS NEEDED FOR NAUSEA 06/07/18  Yes [provider]  propranolol (INDERAL) 10 MG tablet Take by mouth. Up to 5 qd. 10/24/20  Yes [provider]  SUMAtriptan (IMITREX) 100 MG tablet Take by mouth. 04/07/21  Yes [provider]  varenicline  (CHANTIX) 1 MG tablet Take 1 tablet (1 mg total) by mouth 2 (two) times daily. 06/14/21  Yes Hurst, [name], PA-C  VYLEESI 1.75 MG/0.3ML SOAJ Inject into the skin. 03/31/21  Yes [provider]  XIIDRA 5 % SOLN  06/04/20  Yes [provider]  zolpidem (AMBIEN CR) 12.5 MG CR tablet TAKE 1 TABLET(12.5 MG) BY MOUTH AT BEDTIME AS NEEDED FOR SLEEP 06/14/21  Yes Hurst, [name], PA-C  linagliptin (TRADJENTA) 5 MG TABS tablet TAKE 1 TABLET(5 MG) BY MOUTH EVERY DAY 07/23/18 05/30/20  [provider]    Family History Family History  Problem Relation Age of Onset   Hypothyroidism Mother    Diabetes Father  Heart disease Father     Social History Social History   Tobacco Use   Smoking status: Every Day    Packs/day: 0.30    Types: Cigarettes   Smokeless tobacco: Never  Vaping Use   Vaping Use: Never used  Substance Use Topics   Alcohol use: No    Alcohol/week: 0.0 standard drinks   Drug use: No     Allergies   Betadine [povidone iodine], Ketorolac, Amlodipine, Hydrocodone, and Tramadol   Review of Systems Review of Systems  Constitutional:  Negative for activity change, appetite change and fever.  Musculoskeletal:  Positive for arthralgias and joint swelling.  Skin:  Negative for color change and rash.  Hematological: Negative.   Psychiatric/Behavioral: Negative.      Physical Exam Triage Vital Signs ED Triage Vitals  Enc Vitals Group     BP 07/01/21 1903 (!) 149/88     Pulse Rate 07/01/21 1903 94     Resp 07/01/21 1903 18     Temp 07/01/21 1903 99 F (37.2 C)     Temp Source 07/01/21 1903 Oral     SpO2 07/01/21 1903 100 %     Weight 07/01/21 1901 200 lb (90.7 kg)     Height 07/01/21 1901 5\' 3"  (1.6 m)     Head Circumference --      Peak Flow --      Pain Score 07/01/21 1900 7     Pain Loc --      Pain Edu? --      Excl. in GC? --    No data found.  Updated Vital Signs BP (!) 149/88 (BP Location: Left Arm)   Pulse 94   Temp 99 F  (37.2 C) (Oral)   Resp 18   Ht 5\' 3"  (1.6 m)   Wt 200 lb (90.7 kg)   SpO2 100%   BMI 35.43 kg/m   Visual Acuity Right Eye Distance:   Left Eye Distance:   Bilateral Distance:    Right Eye Near:   Left Eye Near:    Bilateral Near:     Physical Exam Vitals and nursing note reviewed.  Constitutional:      General: She is not in acute distress.    Appearance: Normal appearance. She is normal weight. She is not ill-appearing.  HENT:     Head: Normocephalic and atraumatic.  Musculoskeletal:        General: Swelling and tenderness present. No deformity.  Skin:    General: Skin is warm and dry.     Capillary Refill: Capillary refill takes less than 2 seconds.     Findings: No bruising or erythema.  Neurological:     General: No focal deficit present.     Mental Status: She is alert and oriented to person, place, and time.  Psychiatric:        Mood and Affect: Mood normal.        Behavior: Behavior normal.        Thought Content: Thought content normal.        Judgment: Judgment normal.     UC Treatments / Results  Labs (all labs ordered are listed, but only abnormal results are displayed) Labs Reviewed - No data to display  EKG   Radiology No results found.  Procedures Procedures (including critical care time)  Medications Ordered in UC Medications - No data to display  Initial Impression / Assessment and Plan / UC Course  I have reviewed the triage vital signs  and the nursing notes.  Pertinent labs & imaging results that were available during my care of the patient were reviewed by me and considered in my medical decision making (see chart for details).  Patient is a nontoxic-appearing 45 year old female here for evaluation of pain and swelling to her right lateral ankle that is been going on for approximately 3 weeks.  She has seen her podiatrist and was prescribed steroids and pain medication.  She has finished both courses and is not having any resolution  or improvement of her symptoms.  Patient physical exam reveals a well-healed surgical incision along the posterior lateral aspect of the right ankle.  There is some swelling at the posterior and inferior aspect of the lateral malleolus.  There is no ecchymosis, erythema, induration, or warmth coming from the area.  Both DP and PT pulses in the right foot are 2+.  There is no edema, erythema, or ecchymosis of the midfoot or distal foot.  Unclear of the etiology of the patient's pain though the podiatry note from 9/28 indicates that this could possibly be scar tissue.  We will place patient on higher dose of prednisone and give oxycodone 5 mg tablets for pain relief.  I have advised patient that she needs to follow-up with podiatry for further evaluation of her continued pain.  If she is unable to see her podiatrist that she can also access orthopedics at the [facility] orthopedic clinic on [facility].   Final Clinical Impressions(s) / UC Diagnoses   Final diagnoses:  Acute right ankle pain     Discharge Instructions      The prednisone tomorrow morning and take it according the package instructions to decrease inflammation.  Continue to wear your brace, follow the, pressure stocking guideline, and follow RICE therapy.  Using oxycodone as needed for severe pain.  Follow-up with podiatry for continued pain and swelling to determine the neck step.  If you are unable to meet with podiatry I suggest following up with orthopedics.  [facility] has an urgent care at their [facility] location.     ED Prescriptions     Medication Sig Dispense Auth. Provider   predniSONE (STERAPRED UNI-PAK 21 TAB) 10 MG (21) TBPK tablet Take 6 tablets on day 1, 5 tablets day 2, 4 tablets day 3, 3 tablets day 4, 2 tablets day 5, 1 tablet day 6 21 tablet Becky Augusta, NP   oxyCODONE (ROXICODONE) 5 MG immediate release tablet Take 2 tablets (10 mg total) by mouth every 6 (six) hours as needed for severe  pain. 15 tablet Becky Augusta, NP      I have reviewed the PDMP during this encounter.   Becky Augusta, NP 07/01/21 1935

## 2021-07-01 NOTE — Discharge Instructions (Addendum)
The prednisone tomorrow morning and take it according the package instructions to decrease inflammation.  Continue to wear your brace, follow the, pressure stocking guideline, and follow RICE therapy.  Using oxycodone as needed for severe pain.  Follow-up with podiatry for continued pain and swelling to determine the neck step.  If you are unable to meet with podiatry I suggest following up with orthopedics.  [facility] has an urgent care at their [facility] location.

## 2021-07-01 NOTE — Telephone Encounter (Signed)
Patient was senn a couple weeks ago and put on steriods for her right ankle. Pt called today stating she has no relief of pain and she finished the steriods. Patient wants to know what should she do now? She is still in pain. Pt's number is [phone].

## 2021-07-01 NOTE — ED Triage Notes (Signed)
Pt c/o right ankle pain for several weeks. Pt states she did see her podiatrist 06/22/21 and was given oral prednisone and oxycodone. Pt states the pain is not improving. Pt is scheduled for follow up 07/20/21. Pt states that the pain and change to the way she walk is also causing lower back pain. Pt does have a right ankle brace on.

## 2021-07-02 ENCOUNTER — Other Ambulatory Visit: Payer: Self-pay | Admitting: Podiatry

## 2021-07-02 ENCOUNTER — Other Ambulatory Visit: Payer: Self-pay | Admitting: Physician Assistant

## 2021-07-04 NOTE — Telephone Encounter (Signed)
Please advise 

## 2021-07-06 MED ORDER — OXYCODONE-ACETAMINOPHEN 5-325 MG PO TABS: 1.0000 | ORAL_TABLET | ORAL | 0 refills | Status: DC | PRN

## 2021-07-11 ENCOUNTER — Ambulatory Visit (INDEPENDENT_AMBULATORY_CARE_PROVIDER_SITE_OTHER): Payer: BC Managed Care – PPO | Admitting: Podiatry

## 2021-07-11 ENCOUNTER — Encounter: Payer: Self-pay | Admitting: Podiatry

## 2021-07-11 ENCOUNTER — Other Ambulatory Visit: Payer: Self-pay

## 2021-07-11 DIAGNOSIS — M79671 Pain in right foot: Secondary | ICD-10-CM | POA: Diagnosis not present

## 2021-07-11 DIAGNOSIS — S86311S Strain of muscle(s) and tendon(s) of peroneal muscle group at lower leg level, right leg, sequela: Secondary | ICD-10-CM | POA: Diagnosis not present

## 2021-07-11 MED ORDER — OXYCODONE-ACETAMINOPHEN 10-325 MG PO TABS: 1.0000 | ORAL_TABLET | Freq: Every day | ORAL | 0 refills | Status: DC

## 2021-07-11 NOTE — Progress Notes (Signed)
  Subjective:  Patient ID: Katrina Booker, female    DOB: 1975/11/09,  MRN: [id number]  Chief Complaint  Patient presents with   Foot Pain    "I need him to take a look at my ankle and let me know what's going on."    45 y.o. female presents with the above complaint. History confirmed with patient.  She underwent surgery on 03/18/2021 with Dr. Al Corpus for a right ankle peroneal tendon repair of the peroneus longus tendon and a endoscopic fasciotomy of the plantar fascia.  She was doing quite well she was doing physical therapy and improving and was doing much better than she was before surgery until early September when she had a slip and fall and thinks she may have reinjured it.  Not been getting better since despite using the cam boot immobilization rest ice and an anti-inflammatory.  She had been on 2 rounds of steroids and this has not helped either.  Objective:  Physical Exam: warm, good capillary refill, no trophic changes or ulcerative lesions, normal DP and PT pulses, and normal sensory exam.  Right Foot: Her incision is well-healed there is a palpable firmness and edema around the retromalleolar area and quite a bit of pain on palpation and with resisted eversion   Assessment:   1. Tear of peroneal tendon, right, sequela      Plan:  Patient was evaluated and treated and all questions answered.  I reviewed my clinical exam findings with the patient, my chief concern is that she reinjured the tendon and possibly disrupted the repair considering that she was doing very well until this injury.  Has not gotten better in the last several weeks despite conservative treatment.  I recommend we get a new MRI of the right ankle.  She has an upcoming appointment Dr. Al Corpus we will follow-up with him then hopefully can get this completed by that point.  Pain is been quite severe and she is not been able to sleep despite the use of anti-inflammatories and Tylenol.  I recommend she continue  these during the day rest and ice is much as possible and use the boot when needed.  I did prescribe her 10 mg Percocets and discussed with her this should only be used as directed at night for additional pain relief so that she may get some sleep.  She understands I did review the PDMP and she has not had any concerning prescription fills in regards to this.  No follow-ups on file.

## 2021-07-20 ENCOUNTER — Other Ambulatory Visit: Payer: Self-pay

## 2021-07-20 ENCOUNTER — Encounter: Payer: Self-pay | Admitting: Podiatry

## 2021-07-20 ENCOUNTER — Ambulatory Visit (INDEPENDENT_AMBULATORY_CARE_PROVIDER_SITE_OTHER): Payer: BC Managed Care – PPO | Admitting: Podiatry

## 2021-07-20 DIAGNOSIS — M00871 Arthritis due to other bacteria, right ankle and foot: Secondary | ICD-10-CM

## 2021-07-20 DIAGNOSIS — M7751 Other enthesopathy of right foot: Secondary | ICD-10-CM | POA: Diagnosis not present

## 2021-07-20 DIAGNOSIS — S86311S Strain of muscle(s) and tendon(s) of peroneal muscle group at lower leg level, right leg, sequela: Secondary | ICD-10-CM

## 2021-07-20 MED ORDER — OXYCODONE-ACETAMINOPHEN 10-325 MG PO TABS: 1.0000 | ORAL_TABLET | Freq: Three times a day (TID) | ORAL | 0 refills | Status: DC | PRN

## 2021-07-20 NOTE — Progress Notes (Signed)
She presents after having seen Dr. Lilian Kapur last week.  At this point she is going to get an MRI that he ordered.  She states that is just the other week she felt a pop and heard a pop when she was walking into the living room.  She states the foot started hurting terribly.  She is complaining about her hands being painful and swelling with swollen knuckles as well as painful knees bilateral.  Objective: Vital signs are stable alert oriented x3 it appears that she has some subluxation of her peroneus brevis which is the only tendon she has left on the lateral peroneal aspect.  This very well may be the tendon that cause.  She has good abduction against resistance but we can see the bowstringing of the tendon itself.  She may have ruptured retinaculum.  Assessment: Probable tear of the retinaculum cannot rule out tear of that only peroneal tendon left.  Plan: At this point I highly recommended that she get that MRI done put her back in a short cam boot and we are requesting I arthritic profile.  I am also going to refill her Percocet.

## 2021-07-26 ENCOUNTER — Other Ambulatory Visit: Payer: Self-pay | Admitting: *Deleted

## 2021-07-26 ENCOUNTER — Other Ambulatory Visit: Payer: Self-pay | Admitting: Podiatry

## 2021-07-26 MED ORDER — OXYCODONE-ACETAMINOPHEN 10-325 MG PO TABS: 1.0000 | ORAL_TABLET | Freq: Three times a day (TID) | ORAL | 0 refills | Status: DC | PRN

## 2021-07-26 NOTE — Telephone Encounter (Signed)
Please advise 

## 2021-07-27 ENCOUNTER — Encounter: Payer: Self-pay | Admitting: Podiatry

## 2021-07-27 LAB — COMPREHENSIVE METABOLIC PANEL
ALT: 16 IU/L (ref 0–32)
AST: 13 IU/L (ref 0–40)
Albumin/Globulin Ratio: 1.6 (ref 1.2–2.2)
Albumin: 4.2 g/dL (ref 3.8–4.8)
Alkaline Phosphatase: 95 IU/L (ref 44–121)
BUN/Creatinine Ratio: 23 (ref 9–23)
BUN: 16 mg/dL (ref 6–24)
Bilirubin Total: <0.2 mg/dL (ref 0.0–1.2)
CO2: 20 mmol/L (ref 20–29)
Calcium: 9.5 mg/dL (ref 8.7–10.2)
Chloride: 104 mmol/L (ref 96–106)
Creatinine, Ser: 0.71 mg/dL (ref 0.57–1.00)
Globulin, Total: 2.6 g/dL (ref 1.5–4.5)
Glucose: 90 mg/dL (ref 70–99)
Potassium: 4.2 mmol/L (ref 3.5–5.2)
Sodium: 140 mmol/L (ref 134–144)
Total Protein: 6.8 g/dL (ref 6.0–8.5)
eGFR: 108 mL/min/{1.73_m2} (ref >59)

## 2021-07-27 LAB — CBC WITH DIFFERENTIAL/PLATELET
Basophils Absolute: 0 10*3/uL (ref 0.0–0.2)
Basos: 0 %
EOS (ABSOLUTE): 0.2 10*3/uL (ref 0.0–0.4)
Eos: 3 %
Hematocrit: 36.5 % (ref 34.0–46.6)
Hemoglobin: 12 g/dL (ref 11.1–15.9)
Immature Grans (Abs): 0 10*3/uL (ref 0.0–0.1)
Immature Granulocytes: 0 %
Lymphocytes Absolute: 2 10*3/uL (ref 0.7–3.1)
Lymphs: 23 %
MCH: 28.2 pg (ref 26.6–33.0)
MCHC: 32.9 g/dL (ref 31.5–35.7)
MCV: 86 fL (ref 79–97)
Monocytes Absolute: 0.6 10*3/uL (ref 0.1–0.9)
Monocytes: 7 %
Neutrophils Absolute: 5.9 10*3/uL (ref 1.4–7.0)
Neutrophils: 67 %
Platelets: 431 10*3/uL (ref 150–450)
RBC: 4.26 x10E6/uL (ref 3.77–5.28)
RDW: 13.8 % (ref 11.7–15.4)
WBC: 8.8 10*3/uL (ref 3.4–10.8)

## 2021-07-27 LAB — RHEUMATOID FACTOR: Rheumatoid fact SerPl-aCnc: <10 IU/mL (ref <14.0)

## 2021-07-27 LAB — URIC ACID: Uric Acid: 4.2 mg/dL (ref 2.6–6.2)

## 2021-07-27 LAB — SEDIMENTATION RATE: Sed Rate: 34 mm/hr — ABNORMAL HIGH (ref 0–32)

## 2021-07-27 LAB — ANA: Anti Nuclear Antibody (ANA): NEGATIVE

## 2021-07-27 LAB — C-REACTIVE PROTEIN: CRP: 4 mg/L (ref 0–10)

## 2021-07-28 ENCOUNTER — Telehealth: Payer: Self-pay | Admitting: *Deleted

## 2021-07-28 NOTE — Telephone Encounter (Signed)
-----   Message from Elinor Parkinson, [place] sent at 07/28/2021  7:16 AM EDT ----- Blood work doesn't  show any arthritis, just some inflammation.

## 2021-08-01 ENCOUNTER — Other Ambulatory Visit: Payer: Self-pay | Admitting: Podiatry

## 2021-08-01 MED ORDER — OXYCODONE-ACETAMINOPHEN 10-325 MG PO TABS: 1.0000 | ORAL_TABLET | Freq: Three times a day (TID) | ORAL | 0 refills | Status: DC | PRN

## 2021-08-05 ENCOUNTER — Encounter: Payer: Self-pay | Admitting: Podiatry

## 2021-08-05 ENCOUNTER — Inpatient Hospital Stay: Admission: RE | Admit: 2021-08-05 | Payer: BC Managed Care – PPO | Source: Ambulatory Visit

## 2021-08-05 DIAGNOSIS — S86311S Strain of muscle(s) and tendon(s) of peroneal muscle group at lower leg level, right leg, sequela: Secondary | ICD-10-CM

## 2021-08-09 ENCOUNTER — Other Ambulatory Visit: Payer: Self-pay | Admitting: Podiatry

## 2021-08-09 MED ORDER — OXYCODONE-ACETAMINOPHEN 10-325 MG PO TABS: 1.0000 | ORAL_TABLET | Freq: Three times a day (TID) | ORAL | 0 refills | Status: AC | PRN

## 2021-08-10 ENCOUNTER — Ambulatory Visit: Payer: BC Managed Care – PPO | Admitting: Podiatry

## 2021-08-16 NOTE — Telephone Encounter (Signed)
Message error

## 2021-09-03 ENCOUNTER — Other Ambulatory Visit: Payer: Self-pay | Admitting: Podiatry

## 2021-09-04 NOTE — Telephone Encounter (Signed)
Please advise 

## 2021-09-12 ENCOUNTER — Other Ambulatory Visit: Payer: Self-pay | Admitting: Podiatry

## 2021-09-12 NOTE — Telephone Encounter (Signed)
Please advise 

## 2021-09-21 ENCOUNTER — Ambulatory Visit
Admission: RE | Admit: 2021-09-21 | Discharge: 2021-09-21 | Disposition: A | Discharge status: Home / Self Care | Payer: BC Managed Care – PPO | Source: Ambulatory Visit | Attending: Podiatry | Admitting: Podiatry

## 2021-09-21 DIAGNOSIS — S86311S Strain of muscle(s) and tendon(s) of peroneal muscle group at lower leg level, right leg, sequela: Secondary | ICD-10-CM | POA: Diagnosis not present

## 2021-09-28 ENCOUNTER — Other Ambulatory Visit: Payer: Self-pay

## 2021-09-28 ENCOUNTER — Encounter: Payer: Self-pay | Admitting: Podiatry

## 2021-09-28 ENCOUNTER — Ambulatory Visit (INDEPENDENT_AMBULATORY_CARE_PROVIDER_SITE_OTHER): Payer: BC Managed Care – PPO | Admitting: Podiatry

## 2021-09-28 ENCOUNTER — Telehealth: Payer: Self-pay | Admitting: *Deleted

## 2021-09-28 DIAGNOSIS — M7671 Peroneal tendinitis, right leg: Secondary | ICD-10-CM

## 2021-09-28 DIAGNOSIS — S86311S Strain of muscle(s) and tendon(s) of peroneal muscle group at lower leg level, right leg, sequela: Secondary | ICD-10-CM

## 2021-09-28 MED ORDER — OXYCODONE-ACETAMINOPHEN 10-325 MG PO TABS: 1.0000 | ORAL_TABLET | Freq: Three times a day (TID) | ORAL | 0 refills | Status: AC | PRN

## 2021-09-28 MED ORDER — TRIAMCINOLONE ACETONIDE 40 MG/ML IJ SUSP
20.0000 mg | Freq: Once | INTRAMUSCULAR | Status: AC
  Administered 2021-09-28: 20 mg

## 2021-09-28 NOTE — Progress Notes (Signed)
She presents today for follow-up of her right ankle and her peroneal tendons.  She states that is really not getting any better.  It is really unchanged.  She states that she would like to have more pain medication if possible and would like to discuss the MRI.  Objective: Vital signs are stable she is alert and oriented x3.  I explained to her that we are sending the MRI for an over read we do not have those results as of yet.  Pulses remain palpable right foot.  She still has good abduction against resistance tendon is palpable distal to the lateral malleolus and there is no fluctuance in the sheath.  However approximately there is some fluctuance to the posterior inferior aspect of the posterior aspect of the lateral malleolar region.  Assessment peroneal tendinitis right.  Plan: I await the overread for the MRI.  I also provided her with a prescription for oxycodone just a weeks worth.  I also injected the area with 10 mg of Kenalog subcutaneously and not injecting into the tendon.  I will follow-up with her once the overread comes in.  We did discuss the possibility of surgery.

## 2021-09-28 NOTE — Telephone Encounter (Signed)
MRI was done at [facility] - faxed request for disc to be sent to [facility].

## 2021-09-28 NOTE — Addendum Note (Signed)
Addended by: Lottie Rater E on: 09/28/2021 11:13 AM   Modules accepted: Orders, Level of Service

## 2021-09-28 NOTE — Telephone Encounter (Signed)
-----   Message from Elinor Parkinson, [place] sent at 09/27/2021  7:31 AM EST ----- Please send for an over read and inform the patient of the delay.

## 2021-09-28 NOTE — Telephone Encounter (Signed)
Faxed request to [facility] to send patient's MRI disc to [facility].  Faxed order to [facility] for what to review for 2nd opinion.   

## 2021-10-05 ENCOUNTER — Telehealth: Payer: Self-pay | Admitting: *Deleted

## 2021-10-05 NOTE — Telephone Encounter (Signed)
Requesting medication refill

## 2021-10-06 ENCOUNTER — Other Ambulatory Visit: Payer: Self-pay | Admitting: Podiatry

## 2021-10-06 MED ORDER — OXYCODONE-ACETAMINOPHEN 10-325 MG PO TABS
1.0000 | ORAL_TABLET | Freq: Three times a day (TID) | ORAL | 0 refills | Status: DC | PRN
Start: 2021-10-06 — End: 2021-10-12

## 2021-10-09 ENCOUNTER — Other Ambulatory Visit: Payer: Self-pay | Admitting: Podiatry

## 2021-10-10 NOTE — Telephone Encounter (Signed)
Please advise 

## 2021-10-12 ENCOUNTER — Encounter: Payer: Self-pay | Admitting: Podiatry

## 2021-10-12 ENCOUNTER — Other Ambulatory Visit: Payer: Self-pay | Admitting: Podiatry

## 2021-10-12 MED ORDER — OXYCODONE-ACETAMINOPHEN 10-325 MG PO TABS: 1.0000 | ORAL_TABLET | Freq: Three times a day (TID) | ORAL | 0 refills | Status: AC | PRN

## 2021-10-17 ENCOUNTER — Other Ambulatory Visit: Payer: Self-pay

## 2021-10-17 ENCOUNTER — Telehealth: Payer: Self-pay | Admitting: Physician Assistant

## 2021-10-17 MED ORDER — AMPHETAMINE-DEXTROAMPHET ER 30 MG PO CP24: 30.0000 mg | ORAL_CAPSULE | ORAL | 0 refills | Status: DC

## 2021-10-17 MED ORDER — AMPHETAMINE-DEXTROAMPHET ER 25 MG PO CP24: 25.0000 mg | ORAL_CAPSULE | Freq: Every day | ORAL | 0 refills | Status: DC

## 2021-10-17 NOTE — Telephone Encounter (Signed)
pended

## 2021-10-17 NOTE — Telephone Encounter (Signed)
Katrina Booker called to request refill of both her Adderall prescriptions.  30mg  and 25mg   XR.  Appt 3/21.  Send to in [place], [employer].  She is completely out.

## 2021-10-19 ENCOUNTER — Ambulatory Visit (INDEPENDENT_AMBULATORY_CARE_PROVIDER_SITE_OTHER): Payer: BC Managed Care – PPO | Admitting: Podiatry

## 2021-10-19 ENCOUNTER — Other Ambulatory Visit: Payer: Self-pay

## 2021-10-19 DIAGNOSIS — G8929 Other chronic pain: Secondary | ICD-10-CM

## 2021-10-19 DIAGNOSIS — M7751 Other enthesopathy of right foot: Secondary | ICD-10-CM | POA: Diagnosis not present

## 2021-10-19 DIAGNOSIS — M79671 Pain in right foot: Secondary | ICD-10-CM

## 2021-10-19 DIAGNOSIS — M7671 Peroneal tendinitis, right leg: Secondary | ICD-10-CM | POA: Diagnosis not present

## 2021-10-19 MED ORDER — TRIAMCINOLONE ACETONIDE 40 MG/ML IJ SUSP
40.0000 mg | Freq: Once | INTRAMUSCULAR | Status: AC
  Administered 2021-10-19: 10:00:00 40 mg

## 2021-10-19 MED ORDER — OXYCODONE-ACETAMINOPHEN 10-325 MG PO TABS: 1.0000 | ORAL_TABLET | Freq: Three times a day (TID) | ORAL | 0 refills | Status: AC | PRN

## 2021-10-19 NOTE — Progress Notes (Signed)
She presents today for follow-up of her peroneal tendon repair.  She states that is just so painful there is so much pain that is deep and superficial the majority of her deep pain is proximal to her incision up in the tendons at the myotendinous junction laterally and she also has some tenderness over the top of the dorsal lateral aspect of the foot and a small area of tenderness just distal to the lateral malleolus sort of anterior to the lateral malleolus.  She is requesting more narcotic.  Objective: Vital signs are stable alert and oriented x3.  Pulses are palpable.  He has mild edema no erythema cellulitis drainage or odor she has plus pain with frontal plane range of motion in the area Lisfranc's joints where she was complaining.  She has pain on palpation of the proximal tendons at the myotendinous junction of the peroneals and some tenderness distally.  MRIs do demonstrate some conflicting views regarding the postsurgical outcomes but do agree that there is a possible tear of the peroneus brevis.  Assessment chronic pain possibly CRPS with peroneal tendon repair.  And capsulitis of the forefoot and midfoot.  Plan: Discussed etiology pathology and surgical therapies I will refill her narcotic this time.  Also recommended that we discuss as a group possible alternative treatments and therapies.  I did recommend to increase her vitamin C level.  She is already taking more gabapentin than she should.

## 2021-11-30 ENCOUNTER — Other Ambulatory Visit: Payer: Self-pay

## 2021-11-30 ENCOUNTER — Ambulatory Visit (INDEPENDENT_AMBULATORY_CARE_PROVIDER_SITE_OTHER): Payer: BC Managed Care – PPO | Admitting: Podiatry

## 2021-11-30 ENCOUNTER — Encounter: Payer: Self-pay | Admitting: Podiatry

## 2021-11-30 DIAGNOSIS — G5781 Other specified mononeuropathies of right lower limb: Secondary | ICD-10-CM

## 2021-11-30 DIAGNOSIS — G5761 Lesion of plantar nerve, right lower limb: Secondary | ICD-10-CM | POA: Diagnosis not present

## 2021-11-30 MED ORDER — OXYCODONE-ACETAMINOPHEN 10-325 MG PO TABS: 1.0000 | ORAL_TABLET | Freq: Three times a day (TID) | ORAL | 0 refills | Status: AC | PRN

## 2021-11-30 MED ORDER — OXYCODONE-ACETAMINOPHEN 10-325 MG PO TABS
1.0000 | ORAL_TABLET | ORAL | 0 refills | Status: DC | PRN
Start: 2021-11-30 — End: 2022-02-03

## 2021-11-30 NOTE — Progress Notes (Signed)
She presents today for follow-up of her perineal pain she states that it really has not changed by the end of the day is still sore and swollen but still has that radiating pain along the lateral aspect of the foot.  States that it comes up to about here as she points to the mid fibular area of her right leg. ? ?Objective: Vital signs are stable alert and oriented x3.  She has good tendon on palpation no erythema no edema cellulitis drainage odor just and allodynic type pain that radiates from mid fibula distally to the fifth toe.  Very tender on palpation in 1 spot in particular along the sural nerve distribution laterally. ? ?I was able to trace that distribution and find the edge of the nerve which is palpable this very well could be a schwannoma or neuroma or even just a neuritis associated with wearing of the brace or the cast. ? ?Assessment neuritis along the sural nerve laterally and posteriorly. ? ?Plan: I performed a therapeutic nerve block today which immediately relieved 100% of her discomfort.  She is going to see how long this lasts.  We will continue to do this as long as we need to figure out where this nerve is and possibly injected with dexamethasone could possibly injected with a dehydrated alcohol.  May need to just remove it and in total.  Follow-up with her in about 3 weeks ?

## 2021-12-04 ENCOUNTER — Other Ambulatory Visit: Payer: Self-pay | Admitting: Physician Assistant

## 2021-12-06 ENCOUNTER — Encounter: Payer: Self-pay | Admitting: Podiatry

## 2021-12-13 ENCOUNTER — Telehealth: Payer: Medicare Other | Admitting: Physician Assistant

## 2021-12-14 ENCOUNTER — Ambulatory Visit (INDEPENDENT_AMBULATORY_CARE_PROVIDER_SITE_OTHER): Payer: BC Managed Care – PPO | Admitting: Podiatry

## 2021-12-14 ENCOUNTER — Other Ambulatory Visit: Payer: Self-pay

## 2021-12-14 ENCOUNTER — Encounter: Payer: Self-pay | Admitting: Podiatry

## 2021-12-14 DIAGNOSIS — G5781 Other specified mononeuropathies of right lower limb: Secondary | ICD-10-CM

## 2021-12-14 MED ORDER — OXYCODONE-ACETAMINOPHEN 10-325 MG PO TABS: 1.0000 | ORAL_TABLET | Freq: Three times a day (TID) | ORAL | 0 refills | Status: AC | PRN

## 2021-12-14 MED ORDER — DEXAMETHASONE SODIUM PHOSPHATE 120 MG/30ML IJ SOLN
2.0000 mg | Freq: Once | INTRAMUSCULAR | Status: AC
  Administered 2021-12-14: 2 mg via INTRA_ARTICULAR

## 2021-12-14 NOTE — Progress Notes (Signed)
She presents today for follow-up of her sural nerve neuritis on her right leg.  Did okay for a few days and now is back to the sharp pain like it was before. ? ?Objective: Vital signs are stable she is alert and oriented x3 there is no erythema edema/drainage noted peroneal tendon appears to be working very nicely.  She still has a small nodular mass on palpation with radiating pain from that just posterior to the fibula about one third of the way proximal. ? ?Assessment: Neuritis palpable neuroma possibly associated with cast or brace. ? ?Plan: Today we went ahead and the dexamethasone injection in there and we will see how long this will last this will last.  I am going to provide her with a few more Percocets and follow-up with her in a few weeks. ?

## 2021-12-26 ENCOUNTER — Ambulatory Visit (INDEPENDENT_AMBULATORY_CARE_PROVIDER_SITE_OTHER): Payer: BC Managed Care – PPO | Admitting: Physician Assistant

## 2021-12-26 ENCOUNTER — Encounter: Payer: Self-pay | Admitting: Physician Assistant

## 2021-12-26 DIAGNOSIS — F3341 Major depressive disorder, recurrent, in partial remission: Secondary | ICD-10-CM | POA: Diagnosis not present

## 2021-12-26 DIAGNOSIS — F99 Mental disorder, not otherwise specified: Secondary | ICD-10-CM

## 2021-12-26 DIAGNOSIS — F17200 Nicotine dependence, unspecified, uncomplicated: Secondary | ICD-10-CM

## 2021-12-26 DIAGNOSIS — F900 Attention-deficit hyperactivity disorder, predominantly inattentive type: Secondary | ICD-10-CM | POA: Diagnosis not present

## 2021-12-26 DIAGNOSIS — F411 Generalized anxiety disorder: Secondary | ICD-10-CM

## 2021-12-26 DIAGNOSIS — F5105 Insomnia due to other mental disorder: Secondary | ICD-10-CM | POA: Diagnosis not present

## 2021-12-26 MED ORDER — AMPHETAMINE-DEXTROAMPHET ER 25 MG PO CP24: 25.0000 mg | ORAL_CAPSULE | Freq: Every day | ORAL | 0 refills | Status: DC

## 2021-12-26 MED ORDER — ZOLPIDEM TARTRATE ER 12.5 MG PO TBCR: 12.5000 mg | EXTENDED_RELEASE_TABLET | Freq: Every evening | ORAL | 5 refills | Status: DC | PRN

## 2021-12-26 MED ORDER — AMPHETAMINE-DEXTROAMPHET ER 30 MG PO CP24: 30.0000 mg | ORAL_CAPSULE | ORAL | 0 refills | Status: DC

## 2021-12-26 MED ORDER — CLONAZEPAM 0.5 MG PO TABS: 0.5000 mg | ORAL_TABLET | Freq: Every day | ORAL | 5 refills | Status: DC | PRN

## 2021-12-26 NOTE — Progress Notes (Signed)
[facility] ? ?Patient ID: Katrina Booker,  ?MRN: [id number] ? ?PCP: Dorothey BasemanBronstein, David, MD ? ?Date of Evaluation: 12/26/2021 ?Time spent:30 minutes ? ?Chief Complaint:  ?Chief Complaint   ?Anxiety; Depression; Insomnia; ADHD; Follow-up ?  ? ? ?Virtual Visit via Telehealth ? ?I connected with patient by telephone, with their informed consent, and verified patient privacy and that I am speaking with the correct person using two identifiers.  I am private, in my office and the patient is at home. ? ?I discussed the limitations, risks, security and privacy concerns of performing an evaluation and management service by telephone and the availability of in person appointments. I also discussed with the patient that there may be a patient responsible charge related to this service. The patient expressed understanding and agreed to proceed. ?  ?I discussed the assessment and treatment plan with the patient. The patient was provided an opportunity to ask questions and all were answered. The patient agreed with the plan and demonstrated an understanding of the instructions. ?  ?The patient was advised to call back or seek an in-person evaluation if the symptoms worsen or if the condition fails to improve as anticipated. ? ?I provided 30 minutes of non-face-to-face time during this encounter. ? ? ?HISTORY/CURRENT STATUS: ?HPI for routine med check. ? ?Her mental health is about the same.  She feels like all of her medications are working well.  She does have days where she is down because of chronic pain in her leg.  See review of systems.  But most days she feels good mentally.  She is able to enjoy things.  Energy and motivation are good.  Her oldest daughter is getting married next month and she is excited about that.  Personal hygiene and ADLs are within normal limits.  Still having a lot of trouble sleeping though.  Ambien works although she would like to get off of it.  The trazodone alone is not effective.   She does not work outside the home.  No suicidal or homicidal thoughts. ? ?States that attention is good without easy distractibility.  Able to focus on things and finish tasks to completion.  ? ?Patient denies increased energy with decreased need for sleep, no increased talkativeness, no racing thoughts, no impulsivity or risky behaviors, no increased spending, no increased libido, no grandiosity.  No paranoia, and no hallucinations. ? ?Denies dizziness, syncope, seizures, numbness, tingling, tremor, tics, unsteady gait, slurred speech, confusion. Denies muscle or joint pain, stiffness, or dystonia. ? ?Individual Medical History/ Review of Systems: Changes? :No    lidocaine/marcaine and steroid in leg, for nerve damage. Will start denatured alcohol injections soon.  ? ?Past medications for mental health diagnoses include: ?Chantix, Lamictal, BuSpar, symbyax, Zyprexa, Xanax, Latuda, Effexor, Sonata, Ambien, Prozac, Librium, Wellbutrin, Adderall XR, Cymbalta, Hydroxyzine, Klonopin, Zoloft ? ?Allergies: Betadine [povidone iodine], Ketorolac, Amlodipine, Hydrocodone, and Tramadol ? ?Current Medications:  ?Current Outpatient Medications:  ?  albuterol (PROVENTIL HFA;VENTOLIN HFA) 108 (90 BASE) MCG/ACT inhaler, Inhale 2 puffs into the lungs every 6 (six) hours as needed. For ashtma, Disp: , Rfl:  ?  busPIRone (BUSPAR) 30 MG tablet, TAKE 1 TABLET(30 MG) BY MOUTH THREE TIMES DAILY, Disp: 90 tablet, Rfl: 11 ?  Dulaglutide (TRULICITY) 0.75 MG/0.5ML SOPN, ADMINISTER 0.75 MG UNDER THE SKIN 1 TIME A WEEK, Disp: , Rfl:  ?  fexofenadine (ALLEGRA) 180 MG tablet, Take 180 mg by mouth daily. , Disp: , Rfl:  ?  FLUoxetine (PROZAC) 40 MG capsule, TAKE 2 CAPSULES(80  MG) BY MOUTH DAILY, Disp: 60 capsule, Rfl: 11 ?  fluticasone (FLONASE) 50 MCG/ACT nasal spray, Place into both nostrils., Disp: , Rfl:  ?  gabapentin (NEURONTIN) 400 MG capsule, Take 400 mg by mouth. Up to qid prn, Disp: , Rfl:  ?  lamoTRIgine (LAMICTAL) 200 MG tablet, Take  200 mg by mouth 2 (two) times daily., Disp: , Rfl:  ?  lisinopril-hydrochlorothiazide (ZESTORETIC) 20-12.5 MG tablet, Take 1 tablet by mouth every morning., Disp: , Rfl:  ?  metFORMIN (GLUCOPHAGE) 500 MG tablet, Take 500 mg by mouth 2 (two) times daily with a meal., Disp: , Rfl:  ?  naloxone (NARCAN) nasal spray 4 mg/0.1 mL, SMARTSIG:Both Nares, Disp: , Rfl:  ?  OLANZapine (ZYPREXA) 15 MG tablet, TAKE 1 TABLET(15 MG) BY MOUTH AT BEDTIME, Disp: 30 tablet, Rfl: 11 ?  Omega-3 1000 MG CAPS, Take by mouth., Disp: , Rfl:  ?  ondansetron (ZOFRAN) 4 MG tablet, TAKE 1 TABLET(4 MG) BY MOUTH EVERY 8 HOURS AS NEEDED, Disp: 20 tablet, Rfl: 0 ?  potassium chloride (K-DUR) 10 MEQ tablet, TAKE 1 TABLET(10 MEQ) BY MOUTH EVERY DAY, Disp: , Rfl:  ?  promethazine (PHENERGAN) 25 MG tablet, TAKE 1 TABLET BY MOUTH EVERY 4 HOURS AS NEEDED FOR NAUSEA, Disp: , Rfl:  ?  propranolol (INDERAL) 10 MG tablet, Take by mouth. Up to 5 qd., Disp: , Rfl:  ?  SUMAtriptan (IMITREX) 100 MG tablet, Take by mouth., Disp: , Rfl:  ?  traZODone (DESYREL) 50 MG tablet, Take 100 mg by mouth at bedtime., Disp: , Rfl:  ?  XIIDRA 5 % SOLN, , Disp: , Rfl:  ?  [START ON 03/14/2022] amphetamine-dextroamphetamine (ADDERALL XR) 25 MG 24 hr capsule, Take 1 capsule by mouth daily with lunch., Disp: 30 capsule, Rfl: 0 ?  [START ON 02/12/2022] amphetamine-dextroamphetamine (ADDERALL XR) 25 MG 24 hr capsule, Take 1 capsule by mouth daily with lunch., Disp: 30 capsule, Rfl: 0 ?  [START ON 01/14/2022] amphetamine-dextroamphetamine (ADDERALL XR) 25 MG 24 hr capsule, Take 1 capsule by mouth daily with lunch., Disp: 30 capsule, Rfl: 0 ?  [START ON 03/14/2022] amphetamine-dextroamphetamine (ADDERALL XR) 30 MG 24 hr capsule, Take 1 capsule (30 mg total) by mouth every morning., Disp: 30 capsule, Rfl: 0 ?  [START ON 02/12/2022] amphetamine-dextroamphetamine (ADDERALL XR) 30 MG 24 hr capsule, Take 1 capsule (30 mg total) by mouth every morning., Disp: 30 capsule, Rfl: 0 ?  [START ON  01/14/2022] amphetamine-dextroamphetamine (ADDERALL XR) 30 MG 24 hr capsule, Take 1 capsule (30 mg total) by mouth every morning., Disp: 30 capsule, Rfl: 0 ?  clonazePAM (KLONOPIN) 0.5 MG tablet, Take 1 tablet (0.5 mg total) by mouth daily as needed., Disp: 30 tablet, Rfl: 5 ?  meloxicam (MOBIC) 15 MG tablet, TAKE 1 TABLET(15 MG) BY MOUTH DAILY (Patient not taking: Reported on 12/26/2021), Disp: 90 tablet, Rfl: 3 ?  oxyCODONE-acetaminophen (PERCOCET) 10-325 MG tablet, Take 1 tablet by mouth every 4 (four) hours as needed for pain. (Patient not taking: Reported on 12/26/2021), Disp: 21 tablet, Rfl: 0 ?  zolpidem (AMBIEN CR) 12.5 MG CR tablet, Take 1 tablet (12.5 mg total) by mouth at bedtime as needed., Disp: 30 tablet, Rfl: 5 ?Medication Side Effects: none ? ?Family Medical/ Social History: Changes? No ? ?MENTAL HEALTH EXAM: ? ?There were no vitals taken for this visit.There is no height or weight on file to calculate BMI.  ?General Appearance:  Unable to assess  ?Eye Contact:   Unable to assess  ?  Speech:  Clear and Coherent and Normal Rate  ?Volume:  Normal  ?Mood:  Euthymic  ?Affect:   Unable to assess  ?Thought Process:  Goal Directed and Descriptions of Associations: Circumstantial  ?Orientation:  Full (Time, Place, and Person)  ?Thought Content: Logical   ?Suicidal Thoughts:  No  ?Homicidal Thoughts:  No  ?Memory:  WNL  ?Judgement:  Good  ?Insight:  Good  ?Psychomotor Activity:   Unable to assess  ?Concentration:  Concentration: Good and Attention Span: Good  ?Recall:  Good  ?Fund of Knowledge: Good  ?Language: Good  ?Assets:  Financial Resources/Insurance ?Housing ?Transportation  ?ADL's:  Intact  ?Cognition: WNL  ?Prognosis:  Good  ? ?Labs 10/28/2021 ?Reviewed.  See on chart. ?PCP follows for diabetes. ? ?DIAGNOSES:  ?  ICD-10-CM   ?1. Depression, major, recurrent, in partial remission (HCC)  F33.41   ?  ?2. Attention deficit hyperactivity disorder (ADHD), predominantly inattentive type  F90.0   ?  ?3. Generalized  anxiety disorder  F41.1   ?  ?4. Insomnia due to other mental disorder  F51.05   ? F99   ?  ?5. Smoker  F17.200   ?  ? ? ?Receiving Psychotherapy: No  ? ? ?RECOMMENDATIONS:  ?PDMP was reviewed.  Last Add

## 2021-12-28 ENCOUNTER — Ambulatory Visit (INDEPENDENT_AMBULATORY_CARE_PROVIDER_SITE_OTHER): Payer: Medicare Other | Admitting: Podiatry

## 2021-12-28 ENCOUNTER — Encounter: Payer: Self-pay | Admitting: Podiatry

## 2021-12-28 DIAGNOSIS — G5781 Other specified mononeuropathies of right lower limb: Secondary | ICD-10-CM | POA: Diagnosis not present

## 2021-12-28 NOTE — Progress Notes (Signed)
Katrina Booker presents today for follow-up of her neuritis states that it felt better for a few days that it went right back to like it was. ? ?Objective: Vital signs stable alert oriented x3 there is no erythema edema cellulitis drainage or odor she still has a palpable nodule along the lateral aspect of the ankle that radiates distally. ? ?Assessment neuroma lateral leg right. ? ?Plan: Her first dose of dehydrated alcohol was injected to the area today I will follow-up with her in about 3 weeks for another injection. ?

## 2022-01-18 ENCOUNTER — Encounter: Payer: Self-pay | Admitting: Podiatry

## 2022-01-18 ENCOUNTER — Ambulatory Visit: Payer: Medicare Other | Admitting: Podiatry

## 2022-01-18 MED ORDER — ONDANSETRON HCL 4 MG PO TABS: 4.0000 mg | ORAL_TABLET | Freq: Three times a day (TID) | ORAL | 0 refills | Status: DC | PRN

## 2022-01-18 MED ORDER — OXYCODONE-ACETAMINOPHEN 10-325 MG PO TABS
1.0000 | ORAL_TABLET | Freq: Three times a day (TID) | ORAL | 0 refills | Status: AC | PRN
Start: 2022-01-18 — End: 2022-01-25

## 2022-01-25 ENCOUNTER — Ambulatory Visit (INDEPENDENT_AMBULATORY_CARE_PROVIDER_SITE_OTHER): Payer: BC Managed Care – PPO | Admitting: Podiatry

## 2022-01-25 DIAGNOSIS — G5781 Other specified mononeuropathies of right lower limb: Secondary | ICD-10-CM

## 2022-01-30 NOTE — Progress Notes (Signed)
?  Subjective:  ?Patient ID: Pryor Curia, female    DOB: May 10, 1976,  MRN: [id number] ? ?Chief Complaint  ?Patient presents with  ? Tendonitis  ?  3 week follow up  ? ? ?46 y.o. female presents with the above complaint. History confirmed with patient.  She presents today for follow-up on her ongoing nerve pain in the lateral leg the first injection helped quite a bit.  The second injection of alcohol has not helped as much.  She missed her third visit with Dr. Al Corpus and she returns for this today ? ?Objective:  ?Physical Exam: ?warm, good capillary refill, no trophic changes or ulcerative lesions, normal DP and PT pulses, normal sensory exam, and palpable nodule with positive Tinel's on percussion over the lateral cervical nerve in the mid right leg  ?Assessment:  ? ?1. Neuritis of right sural nerve   ? ? ? ?Plan:  ?Patient was evaluated and treated and all questions answered. ? ?We discussed risks and benefits of continuing with sclerosing alcohol injections which I recommended.  I think she should least complete a series of 3 of these every 3 weeks to evaluate their complete effectiveness.  2 cc of sclerosing alcohol and Marcaine was injected at the area of maximal tenderness along the sural nerve on the lateral right leg.  She tolerated well was dressed with a Band-Aid.  We also discussed this if it is not successful then neurectomy be indicated or beneficial ? ?Return in about 3 weeks (around 02/15/2022) for f/u nerve injection.  ? ?

## 2022-02-02 ENCOUNTER — Encounter: Payer: Self-pay | Admitting: Podiatry

## 2022-02-03 ENCOUNTER — Other Ambulatory Visit: Payer: Self-pay | Admitting: Sports Medicine

## 2022-02-03 MED ORDER — OXYCODONE-ACETAMINOPHEN 10-325 MG PO TABS: 1.0000 | ORAL_TABLET | Freq: Three times a day (TID) | ORAL | 0 refills | Status: AC | PRN

## 2022-02-03 NOTE — Progress Notes (Signed)
Refill pain medication until patient can follow-up. ?

## 2022-02-12 ENCOUNTER — Other Ambulatory Visit: Payer: Self-pay | Admitting: Podiatry

## 2022-02-15 ENCOUNTER — Ambulatory Visit: Payer: Medicare Other | Admitting: Podiatry

## 2022-02-15 ENCOUNTER — Other Ambulatory Visit: Payer: Self-pay | Admitting: Podiatry

## 2022-02-15 ENCOUNTER — Telehealth: Payer: Self-pay | Admitting: Podiatry

## 2022-02-15 MED ORDER — OXYCODONE-ACETAMINOPHEN 10-325 MG PO TABS: 1.0000 | ORAL_TABLET | Freq: Three times a day (TID) | ORAL | 0 refills | Status: AC | PRN

## 2022-02-15 NOTE — Telephone Encounter (Signed)
Patient had appt today with Dr Al Corpus, she showed up late we rescheduled her. Pt states she will be out of her pain medication and she would like a refill please. Pharmacy is [facility] in [place].

## 2022-02-18 ENCOUNTER — Encounter: Payer: Self-pay | Admitting: Podiatry

## 2022-02-22 ENCOUNTER — Other Ambulatory Visit: Payer: Self-pay | Admitting: Podiatry

## 2022-02-22 MED ORDER — TRIAMCINOLONE ACETONIDE 0.025 % EX OINT: 1.0000 "application " | TOPICAL_OINTMENT | Freq: Two times a day (BID) | CUTANEOUS | 0 refills | Status: DC

## 2022-02-22 NOTE — Telephone Encounter (Signed)
I will send over a steroid cream. Sorry, I didn't see the message Caryl Pina. When you sent it to me it still show Dr. Milinda Pointer so I didn't see it. I was just going through messages that are still here.

## 2022-02-27 ENCOUNTER — Encounter: Payer: Self-pay | Admitting: Podiatry

## 2022-02-27 ENCOUNTER — Ambulatory Visit (INDEPENDENT_AMBULATORY_CARE_PROVIDER_SITE_OTHER): Payer: BC Managed Care – PPO | Admitting: Podiatry

## 2022-02-27 DIAGNOSIS — G5781 Other specified mononeuropathies of right lower limb: Secondary | ICD-10-CM | POA: Diagnosis not present

## 2022-02-27 DIAGNOSIS — G5761 Lesion of plantar nerve, right lower limb: Secondary | ICD-10-CM | POA: Diagnosis not present

## 2022-02-27 DIAGNOSIS — M7671 Peroneal tendinitis, right leg: Secondary | ICD-10-CM

## 2022-02-27 MED ORDER — DEXAMETHASONE SODIUM PHOSPHATE 120 MG/30ML IJ SOLN
2.0000 mg | Freq: Once | INTRAMUSCULAR | Status: AC
  Administered 2022-02-27: 2 mg via INTRA_ARTICULAR

## 2022-02-27 MED ORDER — OXYCODONE-ACETAMINOPHEN 10-325 MG PO TABS: 1.0000 | ORAL_TABLET | Freq: Three times a day (TID) | ORAL | 0 refills | Status: AC | PRN

## 2022-02-27 NOTE — Progress Notes (Signed)
She presents today for follow-up of her sural nerve neuritis states that the itching and the blisters have subsided for the most part but she started to have pain across the anterior ankle.  Objective: Vital signs are stable she alert and oriented x3 she has pain on palpation and range of motion of her extensor digitorum longus as it crosses just over the ankle joint right at the retinaculum.  She also has still an allodynic type symptomatology stemming from a small bulbar mass on the lateral lower leg right.  Most likely subcutaneous neuroma or trauma to the nerve.  Assessment: Neuritis right leg.  Extensor tendinitis at the level of the ankle right ankle.  Plan: Injected Kenalog and local anesthetic into the ankle area today making sure not to inject into the tendon itself.  Also started her on her third dose of dehydrated alcohol right leg.  Follow-up with her in 3 to 4 weeks and also refilled her Percocet.

## 2022-03-08 ENCOUNTER — Encounter: Payer: Self-pay | Admitting: Podiatry

## 2022-03-09 ENCOUNTER — Other Ambulatory Visit: Payer: Self-pay | Admitting: Podiatry

## 2022-03-09 MED ORDER — OXYCODONE-ACETAMINOPHEN 5-325 MG PO TABS: 1.0000 | ORAL_TABLET | ORAL | 0 refills | Status: DC | PRN

## 2022-03-17 ENCOUNTER — Encounter: Payer: Self-pay | Admitting: Podiatry

## 2022-03-17 ENCOUNTER — Other Ambulatory Visit: Payer: Self-pay | Admitting: Podiatry

## 2022-03-17 MED ORDER — OXYCODONE-ACETAMINOPHEN 5-325 MG PO TABS: 1.0000 | ORAL_TABLET | ORAL | 0 refills | Status: DC | PRN

## 2022-03-22 ENCOUNTER — Other Ambulatory Visit: Payer: Self-pay | Admitting: Podiatry

## 2022-03-22 ENCOUNTER — Telehealth: Payer: Self-pay | Admitting: Podiatry

## 2022-03-22 ENCOUNTER — Encounter (INDEPENDENT_AMBULATORY_CARE_PROVIDER_SITE_OTHER): Payer: Self-pay | Admitting: Podiatry

## 2022-03-22 DIAGNOSIS — S86311S Strain of muscle(s) and tendon(s) of peroneal muscle group at lower leg level, right leg, sequela: Secondary | ICD-10-CM

## 2022-03-22 DIAGNOSIS — G8929 Other chronic pain: Secondary | ICD-10-CM

## 2022-03-22 DIAGNOSIS — G5781 Other specified mononeuropathies of right lower limb: Secondary | ICD-10-CM

## 2022-03-22 NOTE — Telephone Encounter (Signed)
Please advise 

## 2022-03-22 NOTE — Progress Notes (Signed)
NO SHOW. This encounter was created in error - please disregard.

## 2022-03-22 NOTE — Telephone Encounter (Signed)
Rescheduled patients appointment today patient was wondering if she can get a refill of her pain meds. Patient uses [facility] in [place] for her pharmacy.

## 2022-04-12 ENCOUNTER — Telehealth: Payer: Self-pay | Admitting: Physician Assistant

## 2022-04-12 NOTE — Telephone Encounter (Signed)
Kerissa called this morning at 9:10 to request refill of her Adderall XR 25mg  and 30mg .  Appt 06/27/22. Send to in [place].

## 2022-04-12 NOTE — Telephone Encounter (Signed)
FILLED 6/22 DUE 6/21

## 2022-04-14 ENCOUNTER — Other Ambulatory Visit: Payer: Self-pay

## 2022-04-14 MED ORDER — AMPHETAMINE-DEXTROAMPHET ER 30 MG PO CP24: 30.0000 mg | ORAL_CAPSULE | ORAL | 0 refills | Status: DC

## 2022-04-14 MED ORDER — AMPHETAMINE-DEXTROAMPHET ER 25 MG PO CP24: 25.0000 mg | ORAL_CAPSULE | Freq: Every day | ORAL | 0 refills | Status: DC

## 2022-04-14 NOTE — Telephone Encounter (Signed)
Pended.

## 2022-04-19 ENCOUNTER — Ambulatory Visit (INDEPENDENT_AMBULATORY_CARE_PROVIDER_SITE_OTHER): Payer: BC Managed Care – PPO | Admitting: Podiatry

## 2022-04-19 ENCOUNTER — Encounter: Payer: Self-pay | Admitting: Podiatry

## 2022-04-19 DIAGNOSIS — G5781 Other specified mononeuropathies of right lower limb: Secondary | ICD-10-CM

## 2022-04-19 NOTE — Progress Notes (Signed)
She presents today to discuss surgical intervention regarding her right foot and leg she states that she would like to go ahead and have this fixed so does not continue to bother her.  Objective: Vital signs are stable she is alert and oriented x3.  Pulses are palpable.  She has no erythema edema cellulitis drainage or odor she has radiating tenderness on palpation along the peroneal tendons.  And along the sural distribution.  Assessment: Sural nerve entrapment.  Plan: Discussed etiology pathology and surgical therapies at this point were going to disconnect the sural nerve and buried in piece of muscle to help alleviate her symptoms.  I will follow-up with her in the near future for the surgery.  We did discuss possible postop complications which may include but not limited to postop pain bleeding swelling infection recurrence need for further surgery overcorrection under correction also digit loss of limb loss of life she also understands this, be numb for the rest of her life.

## 2022-04-20 ENCOUNTER — Telehealth: Payer: Self-pay | Admitting: Urology

## 2022-04-20 NOTE — Telephone Encounter (Signed)
DOS - 05/05/22  EXCISION NERVE SURAL LEG RIGHT --- 87681 IMPLANTATION OF NERVE RIGHT --- 15726  [employer] EEFECTIVE DATE - 09/26/19  SPOKE WITH KATELYN C. WITH [employer] AND SHE STATED THAT FOR CPT CODES 20355 AND [phone] HAVE BEEN APPROVED, AUTH # [id number] - [id number], GOOD FROM 04/20/22 - 05/05/22.   REF # [id number]

## 2022-05-03 ENCOUNTER — Other Ambulatory Visit: Payer: Self-pay | Admitting: Podiatry

## 2022-05-03 ENCOUNTER — Encounter: Payer: Self-pay | Admitting: Podiatry

## 2022-05-03 MED ORDER — OXYCODONE-ACETAMINOPHEN 10-325 MG PO TABS: 1.0000 | ORAL_TABLET | Freq: Three times a day (TID) | ORAL | 0 refills | Status: AC | PRN

## 2022-05-03 MED ORDER — CLINDAMYCIN HCL 150 MG PO CAPS: 150.0000 mg | ORAL_CAPSULE | Freq: Three times a day (TID) | ORAL | 0 refills | Status: DC

## 2022-05-03 MED ORDER — ONDANSETRON HCL 4 MG PO TABS: 4.0000 mg | ORAL_TABLET | Freq: Three times a day (TID) | ORAL | 0 refills | Status: DC | PRN

## 2022-05-05 DIAGNOSIS — G5761 Lesion of plantar nerve, right lower limb: Secondary | ICD-10-CM | POA: Diagnosis not present

## 2022-05-10 ENCOUNTER — Telehealth: Payer: Self-pay | Admitting: Physician Assistant

## 2022-05-10 ENCOUNTER — Encounter: Payer: Self-pay | Admitting: Podiatry

## 2022-05-10 ENCOUNTER — Ambulatory Visit (INDEPENDENT_AMBULATORY_CARE_PROVIDER_SITE_OTHER): Payer: Medicare Other | Admitting: Podiatry

## 2022-05-10 DIAGNOSIS — G5781 Other specified mononeuropathies of right lower limb: Secondary | ICD-10-CM

## 2022-05-10 DIAGNOSIS — Z9889 Other specified postprocedural states: Secondary | ICD-10-CM

## 2022-05-10 MED ORDER — OXYCODONE-ACETAMINOPHEN 10-325 MG PO TABS: 1.0000 | ORAL_TABLET | Freq: Three times a day (TID) | ORAL | 0 refills | Status: AC | PRN

## 2022-05-10 MED ORDER — CYCLOBENZAPRINE HCL 5 MG PO TABS: 5.0000 mg | ORAL_TABLET | Freq: Three times a day (TID) | ORAL | 1 refills | Status: DC | PRN

## 2022-05-10 NOTE — Telephone Encounter (Signed)
Next visit is 06/27/22. Requesting refills on Adderall XR 25 mg and Adderall XR 30 mg called to:  [facility][id number] Cheree Ditto, [place] - [address] AT [facility] [address] [facility]  Phone:  [phone]  Fax:  [phone]   Caitland states they both are in stock at pharmacy.

## 2022-05-10 NOTE — Progress Notes (Signed)
Katrina Booker presents today for her first postop visit date of surgery 05/05/2022 neurectomy proximal nerve in the right leg is well as a small piece of sural nerve distally around the ankle.  She states that she had a cramp that lasted for about 6 hours and the muscle still little sore she says but the surgical sites are doing great and that she no longer has pain in her leg and foot from the nerve.  She denies fever chills nausea vomiting muscle aches and pains.  Objective: Vital signs are stable she is alert and oriented x3.  Pulses are palpable dry sterile dressing intact was removed demonstrates sutures are intact margins well coapted.  No signs of infection.  She has good range of motion of the foot.  Assessment: Well-healing surgical sites.  Plan: Redressed today dressed a compressive dressing and follow-up with her in 1 week hopefully for suture removal.

## 2022-05-10 NOTE — Telephone Encounter (Signed)
Both last filled 7/21, due 8/19

## 2022-05-12 ENCOUNTER — Other Ambulatory Visit: Payer: Self-pay

## 2022-05-12 MED ORDER — AMPHETAMINE-DEXTROAMPHET ER 25 MG PO CP24: 25.0000 mg | ORAL_CAPSULE | Freq: Every day | ORAL | 0 refills | Status: DC

## 2022-05-12 MED ORDER — AMPHETAMINE-DEXTROAMPHET ER 30 MG PO CP24
30.0000 mg | ORAL_CAPSULE | ORAL | 0 refills | Status: DC
Start: 2022-05-13 — End: 2022-06-27

## 2022-05-12 MED ORDER — AMPHETAMINE-DEXTROAMPHET ER 30 MG PO CP24: 30.0000 mg | ORAL_CAPSULE | ORAL | 0 refills | Status: DC

## 2022-05-12 NOTE — Telephone Encounter (Signed)
Pended.

## 2022-05-17 ENCOUNTER — Ambulatory Visit (INDEPENDENT_AMBULATORY_CARE_PROVIDER_SITE_OTHER): Payer: Medicare Other | Admitting: Podiatry

## 2022-05-17 DIAGNOSIS — G5781 Other specified mononeuropathies of right lower limb: Secondary | ICD-10-CM

## 2022-05-17 DIAGNOSIS — Z9889 Other specified postprocedural states: Secondary | ICD-10-CM

## 2022-05-17 MED ORDER — FLUCONAZOLE 150 MG PO TABS: 150.0000 mg | ORAL_TABLET | Freq: Once | ORAL | 0 refills | Status: AC

## 2022-05-17 NOTE — Progress Notes (Signed)
Patient presents today for post op visit # 2, patient of Dr. Al Corpus.   POV #2 DOS 05/05/2022 NEURECTOMY PROXIMAL BRANCH OF THE SURAL NERVE RT   She presents in her regular shoe gear today with bandages from last visit. Denies any falls or injury to the foot. Foot is swollen some. No signs of infection. No calf pain or shortness of breath. Bandages dry and intact. Incision looks good. She states she has no pain, just numbness around both incisions.   Sutures removed today and steri strips applied. Dressed in a light dressing. Informed her she could get this wet in 3 days. She already has a compression anklet at home and recommended she use that to help control swelling.   Patient requesting fluconazole from taking the post op antibiotics and I did send that in for her.   Reviewed icing and elevation. She will follow up with Dr. Al Corpus in 2 weeks for re-evaluation.

## 2022-06-05 ENCOUNTER — Ambulatory Visit (INDEPENDENT_AMBULATORY_CARE_PROVIDER_SITE_OTHER): Payer: Medicare Other | Admitting: Podiatry

## 2022-06-05 ENCOUNTER — Encounter: Payer: Self-pay | Admitting: Podiatry

## 2022-06-05 DIAGNOSIS — G5781 Other specified mononeuropathies of right lower limb: Secondary | ICD-10-CM

## 2022-06-05 DIAGNOSIS — Z9889 Other specified postprocedural states: Secondary | ICD-10-CM

## 2022-06-05 MED ORDER — ONDANSETRON HCL 4 MG PO TABS: 4.0000 mg | ORAL_TABLET | Freq: Three times a day (TID) | ORAL | 2 refills | Status: DC | PRN

## 2022-06-05 NOTE — Progress Notes (Signed)
She presents today for follow-up of her neurectomy date of surgery May 05, 2022 for excision of proximal branch of the peroneal tendon and of the sural along the lateral foot and leg.  States that is feeling very good that she still gets a little bit nauseous possibly from either her headaches or continued from anesthesia.  She states that there is really no pain in the leg but just some nauseousness.  Objective: Vital signs are stable she is alert and oriented x3.  Pulses are palpable.  No reproducible pain.  Incisions have gone on to close 100%.  Assessment: Some residual nausea otherwise her leg is gone on to heal uneventfully with cessation of her neuritis.  Plan: I will refill her Zofran for her.  I instructed her to follow-up with her primary care provider and consider neurology for her headaches.

## 2022-06-19 ENCOUNTER — Encounter: Payer: BC Managed Care – PPO | Admitting: Podiatry

## 2022-06-27 ENCOUNTER — Encounter: Payer: Self-pay | Admitting: Physician Assistant

## 2022-06-27 ENCOUNTER — Ambulatory Visit (INDEPENDENT_AMBULATORY_CARE_PROVIDER_SITE_OTHER): Payer: BC Managed Care – PPO | Admitting: Physician Assistant

## 2022-06-27 DIAGNOSIS — F411 Generalized anxiety disorder: Secondary | ICD-10-CM | POA: Diagnosis not present

## 2022-06-27 DIAGNOSIS — F5105 Insomnia due to other mental disorder: Secondary | ICD-10-CM | POA: Diagnosis not present

## 2022-06-27 DIAGNOSIS — F99 Mental disorder, not otherwise specified: Secondary | ICD-10-CM

## 2022-06-27 DIAGNOSIS — F3341 Major depressive disorder, recurrent, in partial remission: Secondary | ICD-10-CM

## 2022-06-27 DIAGNOSIS — F17200 Nicotine dependence, unspecified, uncomplicated: Secondary | ICD-10-CM

## 2022-06-27 DIAGNOSIS — F900 Attention-deficit hyperactivity disorder, predominantly inattentive type: Secondary | ICD-10-CM | POA: Diagnosis not present

## 2022-06-27 MED ORDER — AMPHETAMINE-DEXTROAMPHET ER 30 MG PO CP24
30.0000 mg | ORAL_CAPSULE | ORAL | 0 refills | Status: DC
Start: 2022-10-07 — End: 2022-12-07

## 2022-06-27 MED ORDER — AMPHETAMINE-DEXTROAMPHET ER 25 MG PO CP24: 25.0000 mg | ORAL_CAPSULE | Freq: Every day | ORAL | 0 refills | Status: DC

## 2022-06-27 MED ORDER — AMPHETAMINE-DEXTROAMPHET ER 30 MG PO CP24
30.0000 mg | ORAL_CAPSULE | ORAL | 0 refills | Status: DC
Start: 2022-08-09 — End: 2022-11-09

## 2022-06-27 MED ORDER — ZOLPIDEM TARTRATE ER 12.5 MG PO TBCR
12.5000 mg | EXTENDED_RELEASE_TABLET | Freq: Every evening | ORAL | 5 refills | Status: DC | PRN
Start: 2022-06-27 — End: 2022-12-29

## 2022-06-27 MED ORDER — CLONAZEPAM 0.5 MG PO TABS
0.5000 mg | ORAL_TABLET | Freq: Every day | ORAL | 5 refills | Status: DC | PRN
Start: 2022-06-27 — End: 2022-12-29

## 2022-06-27 MED ORDER — OLANZAPINE 15 MG PO TABS
ORAL_TABLET | ORAL | 11 refills | Status: DC
Start: 2022-06-27 — End: 2023-07-03

## 2022-06-27 MED ORDER — AMPHETAMINE-DEXTROAMPHET ER 30 MG PO CP24
30.0000 mg | ORAL_CAPSULE | ORAL | 0 refills | Status: DC
Start: 2022-09-07 — End: 2022-12-08

## 2022-06-27 NOTE — Progress Notes (Signed)
[facility] Check  Patient ID: Katrina Booker,  MRN: [id number]  PCP: Juluis Pitch, MD  Date of Evaluation: 06/27/2022 Time spent:25 minutes  Chief Complaint:  Chief Complaint   Anxiety; Depression; ADD; Insomnia; Follow-up    Virtual Visit via Telehealth  I connected with patient by telephone, with their informed consent, and verified patient privacy and that I am speaking with the correct person using two identifiers.  I am private, in my office and the patient is at home.  I discussed the limitations, risks, security and privacy concerns of performing an evaluation and management service by telephone and the availability of in person appointments. I also discussed with the patient that there may be a patient responsible charge related to this service. The patient expressed understanding and agreed to proceed.   I discussed the assessment and treatment plan with the patient. The patient was provided an opportunity to ask questions and all were answered. The patient agreed with the plan and demonstrated an understanding of the instructions.   The patient was advised to call back or seek an in-person evaluation if the symptoms worsen or if the condition fails to improve as anticipated.  I provided 25 minutes of non-face-to-face time during this encounter.  HISTORY/CURRENT STATUS: HPI for routine med check.  Katrina Booker is doing well as far as her mental health goes. Is able to enjoy things.  Energy and motivation are good.  No extreme sadness, tearfulness, or feelings of hopelessness.  Sleeps well most of the time.  Takes either of the trazodone or Ambien and sometimes of both.  ADLs and personal hygiene are normal.  Not working right now.  Appetite has not changed.  Weight is stable.  Anxiety is pretty well controlled.  The Klonopin is helpful when needed.  Denies suicidal or homicidal thoughts.  Patient denies increased energy with decreased need for sleep, increased  talkativeness, racing thoughts, impulsivity or risky behaviors, increased spending, increased libido, grandiosity, increased irritability or anger, paranoia, or hallucinations.  She had a another ankle surgery not long ago but is recovering well.  Also recently had a severe migraine that lasted several weeks, accompanied by severe nausea and vomiting.  She is better from that now.  She saw her PCP about it and will be referred to a neurologist.  States that attention is good without easy distractibility.  Able to focus on things and finish tasks to completion.   Denies dizziness, syncope, seizures, numbness, tingling, tremor, tics, unsteady gait, slurred speech, confusion. Denies muscle or joint pain, stiffness, or dystonia.  Individual Medical History/ Review of Systems: Changes? :Yes   ankle surgery     Past medications for mental health diagnoses include: Chantix, Lamictal, BuSpar, symbyax, Zyprexa, Xanax, Latuda, Effexor, Sonata, Ambien, Prozac, Librium, Wellbutrin, Adderall XR, Cymbalta, Hydroxyzine, Klonopin, Zoloft  Allergies: Betadine [povidone iodine], Ketorolac, Amlodipine, Hydrocodone, and Tramadol  Current Medications:  Current Outpatient Medications:    albuterol (PROVENTIL HFA;VENTOLIN HFA) 108 (90 BASE) MCG/ACT inhaler, Inhale 2 puffs into the lungs every 6 (six) hours as needed. For ashtma, Disp: , Rfl:    aspirin EC 81 MG tablet, Take 81 mg by mouth daily. Swallow whole., Disp: , Rfl:    busPIRone (BUSPAR) 30 MG tablet, TAKE 1 TABLET(30 MG) BY MOUTH THREE TIMES DAILY, Disp: 90 tablet, Rfl: 11   cyclobenzaprine (FLEXERIL) 5 MG tablet, Take 1 tablet (5 mg total) by mouth 3 (three) times daily as needed for muscle spasms., Disp: 30 tablet, Rfl: 1   Dulaglutide (  TRULICITY) [id number] SOPN, ADMINISTER 0.75 MG UNDER THE SKIN 1 TIME A WEEK, Disp: , Rfl:    fexofenadine (ALLEGRA) 180 MG tablet, Take 180 mg by mouth daily. , Disp: , Rfl:    FLUoxetine (PROZAC) 40 MG capsule, TAKE 2  CAPSULES(80 MG) BY MOUTH DAILY, Disp: 60 capsule, Rfl: 11   fluticasone (FLONASE) 50 MCG/ACT nasal spray, Place into both nostrils., Disp: , Rfl:    gabapentin (NEURONTIN) 400 MG capsule, Take 400 mg by mouth. Up to qid prn, Disp: , Rfl:    lamoTRIgine (LAMICTAL) 200 MG tablet, Take 200 mg by mouth 2 (two) times daily., Disp: , Rfl:    lisinopril-hydrochlorothiazide (ZESTORETIC) 20-12.5 MG tablet, Take 1 tablet by mouth every morning., Disp: , Rfl:    metFORMIN (GLUCOPHAGE) 500 MG tablet, Take 500 mg by mouth 2 (two) times daily with a meal., Disp: , Rfl:    Omega-3 1000 MG CAPS, Take by mouth., Disp: , Rfl:    ondansetron (ZOFRAN) 4 MG tablet, TAKE 1 TABLET(4 MG) BY MOUTH EVERY 8 HOURS AS NEEDED, Disp: 20 tablet, Rfl: 0   ondansetron (ZOFRAN) 4 MG tablet, Take 1 tablet (4 mg total) by mouth every 8 (eight) hours as needed., Disp: 20 tablet, Rfl: 0   ondansetron (ZOFRAN) 4 MG tablet, Take 1 tablet (4 mg total) by mouth every 8 (eight) hours as needed., Disp: 30 tablet, Rfl: 2   potassium chloride (K-DUR) 10 MEQ tablet, TAKE 1 TABLET(10 MEQ) BY MOUTH EVERY DAY, Disp: , Rfl:    promethazine (PHENERGAN) 25 MG tablet, TAKE 1 TABLET BY MOUTH EVERY 4 HOURS AS NEEDED FOR NAUSEA, Disp: , Rfl:    propranolol (INDERAL) 10 MG tablet, Take by mouth. Up to 5 qd., Disp: , Rfl:    SUMAtriptan (IMITREX) 100 MG tablet, Take by mouth., Disp: , Rfl:    terbinafine (LAMISIL) 250 MG tablet, Take 250 mg by mouth daily., Disp: , Rfl:    traZODone (DESYREL) 50 MG tablet, Take 100 mg by mouth at bedtime., Disp: , Rfl:    triamcinolone (KENALOG) 0.025 % ointment, Apply 1 application. topically 2 (two) times daily., Disp: 30 g, Rfl: 0   XIIDRA 5 % SOLN, , Disp: , Rfl:    [START ON 10/07/2022] amphetamine-dextroamphetamine (ADDERALL XR) 25 MG 24 hr capsule, Take 1 capsule by mouth daily with lunch., Disp: 30 capsule, Rfl: 0   [START ON 09/07/2022] amphetamine-dextroamphetamine (ADDERALL XR) 25 MG 24 hr capsule, Take 1 capsule  by mouth daily with lunch., Disp: 30 capsule, Rfl: 0   [START ON 08/09/2022] amphetamine-dextroamphetamine (ADDERALL XR) 25 MG 24 hr capsule, Take 1 capsule by mouth daily with lunch., Disp: 30 capsule, Rfl: 0   [START ON 10/07/2022] amphetamine-dextroamphetamine (ADDERALL XR) 30 MG 24 hr capsule, Take 1 capsule (30 mg total) by mouth every morning., Disp: 30 capsule, Rfl: 0   [START ON 09/07/2022] amphetamine-dextroamphetamine (ADDERALL XR) 30 MG 24 hr capsule, Take 1 capsule (30 mg total) by mouth every morning., Disp: 30 capsule, Rfl: 0   [START ON 08/09/2022] amphetamine-dextroamphetamine (ADDERALL XR) 30 MG 24 hr capsule, Take 1 capsule (30 mg total) by mouth every morning., Disp: 30 capsule, Rfl: 0   clonazePAM (KLONOPIN) 0.5 MG tablet, Take 1 tablet (0.5 mg total) by mouth daily as needed., Disp: 30 tablet, Rfl: 5   naloxone (NARCAN) nasal spray 4 mg/0.1 mL, SMARTSIG:Both Nares (Patient not taking: Reported on 06/27/2022), Disp: , Rfl:    OLANZapine (ZYPREXA) 15 MG tablet, TAKE 1 TABLET(15 MG)  BY MOUTH AT BEDTIME, Disp: 30 tablet, Rfl: 11   oxyCODONE-acetaminophen (PERCOCET) 5-325 MG tablet, Take 1 tablet by mouth every 4 (four) hours as needed for severe pain. (Patient not taking: Reported on 06/27/2022), Disp: 30 tablet, Rfl: 0   zolpidem (AMBIEN CR) 12.5 MG CR tablet, Take 1 tablet (12.5 mg total) by mouth at bedtime as needed., Disp: 30 tablet, Rfl: 5 Medication Side Effects: none  Family Medical/ Social History: Changes? No  MENTAL HEALTH EXAM:  There were no vitals taken for this visit.There is no height or weight on file to calculate BMI.  General Appearance:  Unable to assess  Eye Contact:   Unable to assess  Speech:  Clear and Coherent and Normal Rate  Volume:  Normal  Mood:  Euthymic  Affect:   Unable to assess  Thought Process:  Goal Directed and Descriptions of Associations: Circumstantial  Orientation:  Full (Time, Place, and Person)  Thought Content: Logical   Suicidal  Thoughts:  No  Homicidal Thoughts:  No  Memory:  WNL  Judgement:  Good  Insight:  Good  Psychomotor Activity:   Unable to assess  Concentration:  Concentration: Good and Attention Span: Good  Recall:  Good  Fund of Knowledge: Good  Language: Good  Assets:  Desire for Improvement Financial Resources/Insurance Housing Transportation  ADL's:  Intact  Cognition: WNL  Prognosis:  Good   PCP follows her labs  DIAGNOSES:    ICD-10-CM   1. Depression, major, recurrent, in partial remission ([place])  F33.41     2. Attention deficit hyperactivity disorder (ADHD), predominantly inattentive type  F90.0     3. Generalized anxiety disorder  F41.1     4. Insomnia due to other mental disorder  F51.05    F99     5. Smoker  F17.200      Receiving Psychotherapy: No   RECOMMENDATIONS:  PDMP was reviewed.  Last Klonopin filled 06/14/2022.  Ambien filled 06/14/2022.  Adderall filled 06/12/2022.   I provided 25 minutes of non-face-to-face time during this encounter, including time spent before and after the visit in records review, medical decision making, counseling pertinent to today's visit, and charting.   As far as her mental health is concerned she is doing well so no changes will be made. Smoking cessation discussed.  She is not ready to quit.  Continue Adderall XR 30 mg every morning. Continue Adderall XR 25 mg daily at lunch. Continue BuSpar 30 mg 1 p.o. 3 times daily. Continue Klonopin 0.5 mg 1 p.o. daily as needed. Continue Prozac 40 mg, 2 p.o. daily. Continue gabapentin  400 mg   Per PCP Continue Lamictal 200 mg, 1 p.o. twice daily. Continue Zyprexa 15 mg, nightly. Continue trazodone 50 mg, 1-2 nightly as needed sleep. PCP Rx Continue Ambien CR 12.5 mg p.o. nightly as needed sleep. Labs are followed closely by her primary provider. Return in 6 months.  Donnal Moat, PA-C

## 2022-07-31 ENCOUNTER — Other Ambulatory Visit: Payer: Self-pay | Admitting: Physician Assistant

## 2022-07-31 DIAGNOSIS — R519 Headache, unspecified: Secondary | ICD-10-CM

## 2022-07-31 DIAGNOSIS — G43001 Migraine without aura, not intractable, with status migrainosus: Secondary | ICD-10-CM

## 2022-08-05 ENCOUNTER — Ambulatory Visit: Payer: BC Managed Care – PPO

## 2022-09-11 ENCOUNTER — Other Ambulatory Visit: Payer: Self-pay | Admitting: Podiatry

## 2022-10-12 ENCOUNTER — Other Ambulatory Visit: Payer: Self-pay | Admitting: Physician Assistant

## 2022-11-08 ENCOUNTER — Telehealth: Payer: Self-pay | Admitting: Physician Assistant

## 2022-11-08 NOTE — Telephone Encounter (Signed)
[name] called at 10:19 to request refill of her Adderall.  Appt 4/5.  Send to [facility] in [place].  She said they always have it.

## 2022-11-08 NOTE — Telephone Encounter (Signed)
LF 1/17, due 2/15

## 2022-11-09 ENCOUNTER — Other Ambulatory Visit: Payer: Self-pay

## 2022-11-09 MED ORDER — AMPHETAMINE-DEXTROAMPHET ER 25 MG PO CP24: 25.0000 mg | ORAL_CAPSULE | Freq: Every day | ORAL | 0 refills | Status: DC

## 2022-11-09 MED ORDER — AMPHETAMINE-DEXTROAMPHET ER 30 MG PO CP24: 30.0000 mg | ORAL_CAPSULE | ORAL | 0 refills | Status: DC

## 2022-11-09 NOTE — Telephone Encounter (Signed)
Pended.

## 2022-11-15 ENCOUNTER — Other Ambulatory Visit: Payer: Self-pay | Admitting: Podiatry

## 2022-12-04 ENCOUNTER — Telehealth: Payer: Self-pay | Admitting: Physician Assistant

## 2022-12-04 NOTE — Telephone Encounter (Signed)
Katrina Booker called at 9:13 to request refill of both her Adderalls 25 and '30mg'$  XR.  Appt 4/5.  Send to [facility] in [place].  She hasn't call to make sure they have but she said she has never had a problem with the  not having

## 2022-12-04 NOTE — Telephone Encounter (Signed)
Due 3/14  11/09/2022 11/09/2022 1  Dextroamp-Amphet Er 25 Mg Cap 30.00 30 [place] [id number] [facility] ([id number]) 0/0  [facility] [place] 11/09/2022 11/09/2022 1  Dextroamp-Amphet Er 30 Mg Cap 30.00

## 2022-12-07 ENCOUNTER — Encounter: Payer: Self-pay | Admitting: Podiatry

## 2022-12-07 ENCOUNTER — Other Ambulatory Visit: Payer: Self-pay

## 2022-12-07 MED ORDER — AMPHETAMINE-DEXTROAMPHET ER 30 MG PO CP24: 30.0000 mg | ORAL_CAPSULE | ORAL | 0 refills | Status: DC

## 2022-12-07 MED ORDER — AMPHETAMINE-DEXTROAMPHET ER 25 MG PO CP24: 25.0000 mg | ORAL_CAPSULE | Freq: Every day | ORAL | 0 refills | Status: DC

## 2022-12-08 ENCOUNTER — Other Ambulatory Visit: Payer: Self-pay

## 2022-12-08 MED ORDER — AMPHETAMINE-DEXTROAMPHET ER 25 MG PO CP24: 25.0000 mg | ORAL_CAPSULE | Freq: Every day | ORAL | 0 refills | Status: DC

## 2022-12-08 MED ORDER — AMPHETAMINE-DEXTROAMPHET ER 30 MG PO CP24: 30.0000 mg | ORAL_CAPSULE | ORAL | 0 refills | Status: DC

## 2022-12-08 NOTE — Telephone Encounter (Signed)
Pended.

## 2022-12-13 ENCOUNTER — Ambulatory Visit (INDEPENDENT_AMBULATORY_CARE_PROVIDER_SITE_OTHER): Payer: Medicare Other

## 2022-12-13 ENCOUNTER — Ambulatory Visit (INDEPENDENT_AMBULATORY_CARE_PROVIDER_SITE_OTHER): Payer: Medicare Other | Admitting: Podiatry

## 2022-12-13 ENCOUNTER — Encounter: Payer: Self-pay | Admitting: Podiatry

## 2022-12-13 VITALS — BP 158/88 | HR 78

## 2022-12-13 DIAGNOSIS — G5781 Other specified mononeuropathies of right lower limb: Secondary | ICD-10-CM

## 2022-12-13 NOTE — Progress Notes (Signed)
She presents today chief concern of a painful second digit of the right foot where she dropped a 12 pack of Coke on it about a half a week or so ago states that it has just been very tender and she was concerned because she knew that she had had surgery on it with the screw fixation.  She is also concerned about some flexibility of some hammertoes on the left foot.  Objective: Vital signs stable alert oriented x 3.  Pulses are still strong and palpable.  Capillary fill time is intact may be slightly delayed just a little bit but she is still smoking trying to stop.  Flexible hammertoe deformities 3 through 5 of the left foot #2 is and rigid flexor contraction at the PIPJ.  All have good dorsiflexor he range of motion.  Right foot does demonstrate some tenderness at the proximal base of the second toe does not demonstrate any type of osseous abnormalities on radiographs.  Screw still intact and does not demonstrate any type of fracture.  Assessment contusion toe second right hammertoe deformity 2 through 5 left.  Second is rigid.  Plan: Discussed etiology pathology and surgical therapies at this point discussed flexor contracture deformities and flexor tenotomy's toes 345 left foot possible extensor tenotomy left foot second toe.  Follow-up with me on an as-needed basis

## 2022-12-29 ENCOUNTER — Ambulatory Visit (INDEPENDENT_AMBULATORY_CARE_PROVIDER_SITE_OTHER): Payer: BC Managed Care – PPO | Admitting: Physician Assistant

## 2022-12-29 ENCOUNTER — Encounter: Payer: Self-pay | Admitting: Physician Assistant

## 2022-12-29 DIAGNOSIS — F411 Generalized anxiety disorder: Secondary | ICD-10-CM | POA: Diagnosis not present

## 2022-12-29 DIAGNOSIS — Z79899 Other long term (current) drug therapy: Secondary | ICD-10-CM

## 2022-12-29 DIAGNOSIS — F5105 Insomnia due to other mental disorder: Secondary | ICD-10-CM | POA: Diagnosis not present

## 2022-12-29 DIAGNOSIS — F900 Attention-deficit hyperactivity disorder, predominantly inattentive type: Secondary | ICD-10-CM

## 2022-12-29 DIAGNOSIS — F3341 Major depressive disorder, recurrent, in partial remission: Secondary | ICD-10-CM

## 2022-12-29 DIAGNOSIS — F99 Mental disorder, not otherwise specified: Secondary | ICD-10-CM

## 2022-12-29 MED ORDER — AMPHETAMINE-DEXTROAMPHET ER 25 MG PO CP24: 25.0000 mg | ORAL_CAPSULE | Freq: Every day | ORAL | 0 refills | Status: DC

## 2022-12-29 MED ORDER — AMPHETAMINE-DEXTROAMPHET ER 30 MG PO CP24: 30.0000 mg | ORAL_CAPSULE | ORAL | 0 refills | Status: DC

## 2022-12-29 MED ORDER — ZOLPIDEM TARTRATE ER 12.5 MG PO TBCR: 12.5000 mg | EXTENDED_RELEASE_TABLET | Freq: Every evening | ORAL | 5 refills | Status: DC | PRN

## 2022-12-29 MED ORDER — BUSPIRONE HCL 30 MG PO TABS: ORAL_TABLET | ORAL | 11 refills | Status: DC

## 2022-12-29 MED ORDER — FLUOXETINE HCL 40 MG PO CAPS: 80.0000 mg | ORAL_CAPSULE | Freq: Every day | ORAL | 11 refills | Status: DC

## 2022-12-29 MED ORDER — CLONAZEPAM 0.5 MG PO TABS: 0.5000 mg | ORAL_TABLET | Freq: Every day | ORAL | 5 refills | Status: DC | PRN

## 2022-12-29 NOTE — Progress Notes (Signed)
[facility]  Patient ID: Pryor CuriaCrystal B Kooy,  MRN: [id number]  PCP: Dorothey BasemanBronstein, David, MD  Date of Evaluation: 12/29/2022 Time spent:25 minutes  Chief Complaint:  Chief Complaint   Anxiety; Depression; ADD; Insomnia; Follow-up    Virtual Visit via Telehealth  I connected with patient by telephone, with their informed consent, and verified patient privacy and that I am speaking with the correct person using two identifiers.  I am private, in my office and the patient is at home.  I discussed the limitations, risks, security and privacy concerns of performing an evaluation and management service by telephone and the availability of in person appointments. I also discussed with the patient that there may be a patient responsible charge related to this service. The patient expressed understanding and agreed to proceed.   I discussed the assessment and treatment plan with the patient. The patient was provided an opportunity to ask questions and all were answered. The patient agreed with the plan and demonstrated an understanding of the instructions.   The patient was advised to call back or seek an in-person evaluation if the symptoms worsen or if the condition fails to improve as anticipated.  I provided 25 minutes of non-face-to-face time during this encounter.  HISTORY/CURRENT STATUS: HPI for routine 6 month med check.  Oney is doing well. Patient is able to enjoy things.  Energy and motivation are good.  Not working outside the home. On disability for both physical and mental reasons.  Has been taking classes, Adderall helps her stay on task.  No extreme sadness, tearfulness, or feelings of hopelessness.  Sleeps well with the Ambien.  ADLs and personal hygiene are normal.  Appetite has not changed.  She has purposely lost weight.  Denies suicidal or homicidal thoughts.  States that attention is good without easy distractibility.  Able to focus on things and finish tasks to  completion.   She is currently on Chantix prescribed by her PCP.  Patient denies increased energy with decreased need for sleep, increased talkativeness, racing thoughts, impulsivity or risky behaviors, increased spending, increased libido, grandiosity, increased irritability or anger, paranoia, or hallucinations.  Denies dizziness, syncope, seizures, numbness, tingling, tremor, tics, unsteady gait, slurred speech, confusion. Denies muscle or joint pain, stiffness, or dystonia.  Individual Medical History/ Review of Systems: Changes? :No     Past medications for mental health diagnoses include: Chantix, Lamictal, BuSpar, symbyax, Zyprexa, Xanax, Latuda, Effexor, Sonata, Ambien, Prozac, Librium, Wellbutrin, Adderall XR, Cymbalta, Hydroxyzine, Klonopin, Zoloft  Allergies: Betadine [povidone iodine], Ketorolac, Amlodipine, Hydrocodone, and Tramadol  Current Medications:  Current Outpatient Medications:    albuterol (PROVENTIL HFA;VENTOLIN HFA) 108 (90 BASE) MCG/ACT inhaler, Inhale 2 puffs into the lungs every 6 (six) hours as needed. For ashtma, Disp: , Rfl:    aspirin EC 81 MG tablet, Take 81 mg by mouth daily. Swallow whole., Disp: , Rfl:    cyclobenzaprine (FLEXERIL) 5 MG tablet, Take 1 tablet (5 mg total) by mouth 3 (three) times daily as needed for muscle spasms., Disp: 30 tablet, Rfl: 1   Dulaglutide (TRULICITY) 0.75 MG/0.5ML SOPN, ADMINISTER 0.75 MG UNDER THE SKIN 1 TIME A WEEK, Disp: , Rfl:    fexofenadine (ALLEGRA) 180 MG tablet, Take 180 mg by mouth daily. , Disp: , Rfl:    fluticasone (FLONASE) 50 MCG/ACT nasal spray, Place into both nostrils., Disp: , Rfl:    gabapentin (NEURONTIN) 400 MG capsule, Take 400 mg by mouth. Up to qid prn, Disp: , Rfl:    lamoTRIgine (  LAMICTAL) 200 MG tablet, Take 200 mg by mouth 2 (two) times daily., Disp: , Rfl:    lisinopril-hydrochlorothiazide (ZESTORETIC) 20-12.5 MG tablet, Take 1 tablet by mouth every morning., Disp: , Rfl:    metFORMIN  (GLUCOPHAGE) 500 MG tablet, Take 500 mg by mouth 2 (two) times daily with a meal., Disp: , Rfl:    OLANZapine (ZYPREXA) 15 MG tablet, TAKE 1 TABLET(15 MG) BY MOUTH AT BEDTIME, Disp: 30 tablet, Rfl: 11   Omega-3 1000 MG CAPS, Take by mouth., Disp: , Rfl:    ondansetron (ZOFRAN) 4 MG tablet, TAKE 1 TABLET(4 MG) BY MOUTH EVERY 8 HOURS AS NEEDED, Disp: 30 tablet, Rfl: 2   potassium chloride (K-DUR) 10 MEQ tablet, TAKE 1 TABLET(10 MEQ) BY MOUTH EVERY DAY, Disp: , Rfl:    promethazine (PHENERGAN) 25 MG tablet, TAKE 1 TABLET BY MOUTH EVERY 4 HOURS AS NEEDED FOR NAUSEA, Disp: , Rfl:    propranolol (INDERAL) 10 MG tablet, Take by mouth. Up to 5 qd., Disp: , Rfl:    SUMAtriptan (IMITREX) 100 MG tablet, Take by mouth., Disp: , Rfl:    traZODone (DESYREL) 50 MG tablet, Take 100 mg by mouth at bedtime., Disp: , Rfl:    triamcinolone (KENALOG) 0.025 % ointment, Apply 1 application. topically 2 (two) times daily., Disp: 30 g, Rfl: 0   varenicline (CHANTIX) 1 MG tablet, Take 1 tablet by mouth 2 (two) times daily., Disp: , Rfl:    XIIDRA 5 % SOLN, , Disp: , Rfl:    [START ON 01/08/2023] amphetamine-dextroamphetamine (ADDERALL XR) 25 MG 24 hr capsule, Take 1 capsule by mouth daily with lunch., Disp: 30 capsule, Rfl: 0   [START ON 03/08/2023] amphetamine-dextroamphetamine (ADDERALL XR) 25 MG 24 hr capsule, Take 1 capsule by mouth daily with lunch., Disp: 30 capsule, Rfl: 0   [START ON 02/06/2023] amphetamine-dextroamphetamine (ADDERALL XR) 25 MG 24 hr capsule, Take 1 capsule by mouth daily with lunch., Disp: 30 capsule, Rfl: 0   [START ON 01/08/2023] amphetamine-dextroamphetamine (ADDERALL XR) 30 MG 24 hr capsule, Take 1 capsule (30 mg total) by mouth every morning., Disp: 30 capsule, Rfl: 0   [START ON 02/06/2023] amphetamine-dextroamphetamine (ADDERALL XR) 30 MG 24 hr capsule, Take 1 capsule (30 mg total) by mouth every morning., Disp: 30 capsule, Rfl: 0   [START ON 03/08/2023] amphetamine-dextroamphetamine (ADDERALL XR)  30 MG 24 hr capsule, Take 1 capsule (30 mg total) by mouth every morning., Disp: 30 capsule, Rfl: 0   busPIRone (BUSPAR) 30 MG tablet, TAKE 1 TABLET(30 MG) BY MOUTH THREE TIMES DAILY, Disp: 90 tablet, Rfl: 11   clonazePAM (KLONOPIN) 0.5 MG tablet, Take 1 tablet (0.5 mg total) by mouth daily as needed., Disp: 30 tablet, Rfl: 5   FLUoxetine (PROZAC) 40 MG capsule, Take 2 capsules (80 mg total) by mouth daily., Disp: 60 capsule, Rfl: 11   naloxone (NARCAN) nasal spray 4 mg/0.1 mL, SMARTSIG:Both Nares (Patient not taking: Reported on 06/27/2022), Disp: , Rfl:    oxyCODONE-acetaminophen (PERCOCET) 5-325 MG tablet, Take 1 tablet by mouth every 4 (four) hours as needed for severe pain. (Patient not taking: Reported on 06/27/2022), Disp: 30 tablet, Rfl: 0   terbinafine (LAMISIL) 250 MG tablet, Take 250 mg by mouth daily. (Patient not taking: Reported on 12/29/2022), Disp: , Rfl:    zolpidem (AMBIEN CR) 12.5 MG CR tablet, Take 1 tablet (12.5 mg total) by mouth at bedtime as needed., Disp: 30 tablet, Rfl: 5 Medication Side Effects: none  Family Medical/ Social History: Changes? No  MENTAL HEALTH EXAM:  There were no vitals taken for this visit.There is no height or weight on file to calculate BMI.  General Appearance:  Unable to assess  Eye Contact:   Unable to assess  Speech:  Clear and Coherent and Normal Rate  Volume:  Normal  Mood:  Euthymic  Affect:   Unable to assess  Thought Process:  Goal Directed and Descriptions of Associations: Circumstantial  Orientation:  Full (Time, Place, and Person)  Thought Content: Logical   Suicidal Thoughts:  No  Homicidal Thoughts:  No  Memory:  WNL  Judgement:  Good  Insight:  Good  Psychomotor Activity:   Unable to assess  Concentration:  Concentration: Good and Attention Span: Good  Recall:  Good  Fund of Knowledge: Good  Language: Good  Assets:  Communication Skills Desire for Improvement Financial Resources/Insurance Housing Transportation  ADL's:   Intact  Cognition: WNL  Prognosis:  Good   PCP follows her labs  DIAGNOSES:    ICD-10-CM   1. Depression, major, recurrent, in partial remission  F33.41     2. Attention deficit hyperactivity disorder (ADHD), predominantly inattentive type  F90.0     3. Generalized anxiety disorder  F41.1     4. Insomnia due to other mental disorder  F51.05    F99     5. Polypharmacy  Z79.899       Receiving Psychotherapy: No   RECOMMENDATIONS:  PDMP was reviewed.  Last Klonopin filled 12/13/2022.  Ambien filled 12/13/2022.  Adderall filled 12/09/2022.   I provided 25 minutes of non-face-to-face time during this encounter, including time spent before and after the visit in records review, medical decision making, counseling pertinent to today's visit, and charting.   I am glad to see her doing so well.  No changes needed. Smoking cessation discussed.  She will continue to work on quitting, on the Chantix. We are both aware of the concomitant benzo and stimulant use.  She has been on this combination for years, we have decreased the Klonopin over time but have not been able to get her completely off of it.  She understands the pros and cons of taking both medications and the fact that it is not something we typically prescribed together.  However in her situation I think both meds benefit her which outweighs the negatives.  Continue Adderall XR 30 mg every morning. Continue Adderall XR 25 mg daily at lunch. Continue BuSpar 30 mg 1 p.o. 3 times daily. Continue Klonopin 0.5 mg 1 p.o. daily as needed. Continue Prozac 40 mg, 2 p.o. daily. Continue gabapentin  400 mg   Per PCP Continue Lamictal 200 mg, 1 p.o. twice daily. Continue Zyprexa 15 mg, nightly. Continue trazodone 50 mg, 1-2 nightly as needed sleep. PCP Rx Continue Ambien CR 12.5 mg p.o. nightly as needed sleep. Labs are followed closely by her primary provider. Return in 6 months.  Melony Overly, PA-C

## 2023-01-08 ENCOUNTER — Telehealth: Payer: Self-pay | Admitting: Physician Assistant

## 2023-01-08 NOTE — Telephone Encounter (Signed)
[facility] sent PA Request for Adderall XR 25mg   1 per day,  see CMM

## 2023-01-16 NOTE — Telephone Encounter (Signed)
Pt has already picked up both doses of Adderall on 01/08/23 using commercial insurance

## 2023-01-18 ENCOUNTER — Other Ambulatory Visit: Payer: Self-pay | Admitting: Podiatry

## 2023-03-20 ENCOUNTER — Encounter: Payer: Self-pay | Admitting: Podiatry

## 2023-04-09 ENCOUNTER — Other Ambulatory Visit: Payer: Self-pay | Admitting: Podiatry

## 2023-04-09 NOTE — Telephone Encounter (Signed)
Refill not indicated at this time

## 2023-05-03 ENCOUNTER — Telehealth: Payer: Self-pay | Admitting: Physician Assistant

## 2023-05-03 NOTE — Telephone Encounter (Signed)
LF 7/14, due 8/11

## 2023-05-03 NOTE — Telephone Encounter (Signed)
Pt called at 3:15pm. Requests RF on Adderall Xr25mg  and Adderall XR 30mg  . Please send to : [facility] #[id number] - [place], [place] - [address] AT [facility] [address]

## 2023-05-04 ENCOUNTER — Other Ambulatory Visit: Payer: Self-pay

## 2023-05-04 MED ORDER — AMPHETAMINE-DEXTROAMPHET ER 30 MG PO CP24: 30.0000 mg | ORAL_CAPSULE | ORAL | 0 refills | Status: DC

## 2023-05-04 MED ORDER — AMPHETAMINE-DEXTROAMPHET ER 25 MG PO CP24: 25.0000 mg | ORAL_CAPSULE | Freq: Every day | ORAL | 0 refills | Status: DC

## 2023-05-04 NOTE — Telephone Encounter (Signed)
Pended.

## 2023-06-04 ENCOUNTER — Other Ambulatory Visit: Payer: Self-pay

## 2023-06-04 ENCOUNTER — Telehealth: Payer: Self-pay | Admitting: Physician Assistant

## 2023-06-04 NOTE — Telephone Encounter (Signed)
Pt called to request RF on Adderall Xr25mg  and Adderall Xr30mg , Please send to : [facility] #[id number] - [place], [place] - [address] AT [facility] [address] [facility]

## 2023-06-04 NOTE — Telephone Encounter (Signed)
Pended.

## 2023-06-04 NOTE — Telephone Encounter (Signed)
05/08/2023 05/04/2023 1  Dextroamp-Amphet Er 25 Mg Cap 30.00 30 Br [id number] [id number]    05/07/2023 05/04/2023 1  Dextroamp-Amphet Er 30 Mg Cap 30.00

## 2023-06-05 MED ORDER — AMPHETAMINE-DEXTROAMPHET ER 30 MG PO CP24: 30.0000 mg | ORAL_CAPSULE | ORAL | 0 refills | Status: DC

## 2023-06-05 MED ORDER — AMPHETAMINE-DEXTROAMPHET ER 25 MG PO CP24: 25.0000 mg | ORAL_CAPSULE | Freq: Every day | ORAL | 0 refills | Status: DC

## 2023-06-18 ENCOUNTER — Ambulatory Visit: Payer: BC Managed Care – PPO | Admitting: Podiatry

## 2023-07-03 ENCOUNTER — Ambulatory Visit (INDEPENDENT_AMBULATORY_CARE_PROVIDER_SITE_OTHER): Payer: BC Managed Care – PPO | Admitting: Physician Assistant

## 2023-07-03 ENCOUNTER — Encounter: Payer: Self-pay | Admitting: Physician Assistant

## 2023-07-03 DIAGNOSIS — F900 Attention-deficit hyperactivity disorder, predominantly inattentive type: Secondary | ICD-10-CM

## 2023-07-03 DIAGNOSIS — F3341 Major depressive disorder, recurrent, in partial remission: Secondary | ICD-10-CM | POA: Diagnosis not present

## 2023-07-03 DIAGNOSIS — Z79899 Other long term (current) drug therapy: Secondary | ICD-10-CM

## 2023-07-03 DIAGNOSIS — F99 Mental disorder, not otherwise specified: Secondary | ICD-10-CM

## 2023-07-03 DIAGNOSIS — F17200 Nicotine dependence, unspecified, uncomplicated: Secondary | ICD-10-CM

## 2023-07-03 DIAGNOSIS — F5105 Insomnia due to other mental disorder: Secondary | ICD-10-CM

## 2023-07-03 DIAGNOSIS — F411 Generalized anxiety disorder: Secondary | ICD-10-CM | POA: Diagnosis not present

## 2023-07-03 DIAGNOSIS — F4321 Adjustment disorder with depressed mood: Secondary | ICD-10-CM

## 2023-07-03 MED ORDER — AMPHETAMINE-DEXTROAMPHET ER 30 MG PO CP24: 30.0000 mg | ORAL_CAPSULE | ORAL | 0 refills | Status: DC

## 2023-07-03 MED ORDER — AMPHETAMINE-DEXTROAMPHET ER 25 MG PO CP24: 25.0000 mg | ORAL_CAPSULE | Freq: Every day | ORAL | 0 refills | Status: DC

## 2023-07-03 MED ORDER — CLONAZEPAM 0.5 MG PO TABS: 0.5000 mg | ORAL_TABLET | Freq: Every day | ORAL | 5 refills | Status: DC | PRN

## 2023-07-03 MED ORDER — OLANZAPINE 15 MG PO TABS: ORAL_TABLET | ORAL | 11 refills | Status: DC

## 2023-07-03 MED ORDER — ZOLPIDEM TARTRATE ER 12.5 MG PO TBCR: 12.5000 mg | EXTENDED_RELEASE_TABLET | Freq: Every evening | ORAL | 5 refills | Status: DC | PRN

## 2023-07-03 NOTE — Progress Notes (Signed)
[facility]  Patient ID: Katrina Booker,  MRN: [id number]  PCP: Dorothey Baseman, MD  Date of Evaluation: 07/03/2023 Time spent:30 minutes  Chief Complaint:  Chief Complaint   Anxiety; Depression; ADD; Insomnia; Follow-up    Virtual Visit via Telehealth  I connected with patient by telephone, with their informed consent, and verified patient privacy and that I am speaking with the correct person using two identifiers.  I am private, in my office and the patient is at home.  I discussed the limitations, risks, security and privacy concerns of performing an evaluation and management service by telephone and the availability of in person appointments. I also discussed with the patient that there may be a patient responsible charge related to this service. The patient expressed understanding and agreed to proceed.   I discussed the assessment and treatment plan with the patient. The patient was provided an opportunity to ask questions and all were answered. The patient agreed with the plan and demonstrated an understanding of the instructions.   The patient was advised to call back or seek an in-person evaluation if the symptoms worsen or if the condition fails to improve as anticipated.  I provided 30 minutes of non-face-to-face time during this encounter.  HISTORY/CURRENT STATUS: HPI for routine 6 month med check.  Has had a hard few months. Her best friend committed suicide in March and her family did not even let her know.  She has been grieving a lot, because she did not get to say goodbye.  She did not feel like getting out of bed or doing anything that she usually would like to do.  But she has been through all the stages of grief and decided a few months ago that she wanted to get better.  Her mood has gradually improved and even though she is still grieving the loss she is handling it better.  She is still in school, studying philosophy and psychology. States that  attention is good without easy distractibility.  Able to focus on things and finish tasks to completion.   Energy and motivation are good.  No extreme sadness, tearfulness, or feelings of hopelessness.  Sleeps well with the Ambien.  ADLs and personal hygiene are normal.   Appetite has not changed.  Weight is stable.  She still gets anxious at times and takes the Klonopin, only 1/day.  That she usually in the evenings.  She does not have panic attacks but just gets overwhelmed.  Denies suicidal or homicidal thoughts.  Patient denies increased energy with decreased need for sleep, increased talkativeness, racing thoughts, impulsivity or risky behaviors, increased spending, increased libido, grandiosity, increased irritability or anger, paranoia, or hallucinations.  Denies dizziness, syncope, seizures, numbness, tingling, tremor, tics, unsteady gait, slurred speech, confusion. Denies muscle or joint pain, stiffness, or dystonia.  Individual Medical History/ Review of Systems: Changes? :No     Past medications for mental health diagnoses include: Chantix, Lamictal, BuSpar, symbyax, Zyprexa, Xanax, Latuda, Effexor, Sonata, Ambien, Prozac, Librium, Wellbutrin, Adderall XR, Cymbalta, Hydroxyzine, Klonopin, Zoloft  Allergies: Betadine [povidone iodine], Ketorolac, Amlodipine, Hydrocodone, and Tramadol  Current Medications:  Current Outpatient Medications:    albuterol (PROVENTIL HFA;VENTOLIN HFA) 108 (90 BASE) MCG/ACT inhaler, Inhale 2 puffs into the lungs every 6 (six) hours as needed. For ashtma, Disp: , Rfl:    aspirin EC 81 MG tablet, Take 81 mg by mouth daily. Swallow whole., Disp: , Rfl:    atorvastatin (LIPITOR) 40 MG tablet, Take 40 mg by mouth daily.,  Disp: , Rfl:    busPIRone (BUSPAR) 30 MG tablet, TAKE 1 TABLET(30 MG) BY MOUTH THREE TIMES DAILY, Disp: 90 tablet, Rfl: 11   Dulaglutide (TRULICITY) 0.75 MG/0.5ML SOPN, ADMINISTER 0.75 MG UNDER THE SKIN 1 TIME A WEEK, Disp: , Rfl:    fexofenadine  (ALLEGRA) 180 MG tablet, Take 180 mg by mouth daily. , Disp: , Rfl:    FLUoxetine (PROZAC) 40 MG capsule, Take 2 capsules (80 mg total) by mouth daily., Disp: 60 capsule, Rfl: 11   fluticasone (FLONASE) 50 MCG/ACT nasal spray, Place into both nostrils., Disp: , Rfl:    gabapentin (NEURONTIN) 400 MG capsule, Take 400 mg by mouth. Up to qid prn, Disp: , Rfl:    lamoTRIgine (LAMICTAL) 200 MG tablet, Take 200 mg by mouth 2 (two) times daily., Disp: , Rfl:    lisinopril-hydrochlorothiazide (ZESTORETIC) 20-12.5 MG tablet, Take 1 tablet by mouth every morning., Disp: , Rfl:    metFORMIN (GLUCOPHAGE) 500 MG tablet, Take 500 mg by mouth 2 (two) times daily with a meal., Disp: , Rfl:    Omega-3 1000 MG CAPS, Take by mouth., Disp: , Rfl:    ondansetron (ZOFRAN) 4 MG tablet, TAKE 1 TABLET(4 MG) BY MOUTH EVERY 8 HOURS AS NEEDED, Disp: 30 tablet, Rfl: 2   potassium chloride (K-DUR) 10 MEQ tablet, TAKE 1 TABLET(10 MEQ) BY MOUTH EVERY DAY, Disp: , Rfl:    promethazine (PHENERGAN) 25 MG tablet, TAKE 1 TABLET BY MOUTH EVERY 4 HOURS AS NEEDED FOR NAUSEA, Disp: , Rfl:    propranolol (INDERAL) 10 MG tablet, Take by mouth. Up to 5 qd., Disp: , Rfl:    SUMAtriptan (IMITREX) 100 MG tablet, Take by mouth., Disp: , Rfl:    traZODone (DESYREL) 50 MG tablet, Take 100 mg by mouth at bedtime., Disp: , Rfl:    XIIDRA 5 % SOLN, , Disp: , Rfl:    amphetamine-dextroamphetamine (ADDERALL XR) 25 MG 24 hr capsule, Take 1 capsule by mouth daily with lunch., Disp: 30 capsule, Rfl: 0   [START ON 08/02/2023] amphetamine-dextroamphetamine (ADDERALL XR) 25 MG 24 hr capsule, Take 1 capsule by mouth daily with lunch., Disp: 30 capsule, Rfl: 0   [START ON 08/31/2023] amphetamine-dextroamphetamine (ADDERALL XR) 25 MG 24 hr capsule, Take 1 capsule by mouth daily with lunch., Disp: 30 capsule, Rfl: 0   amphetamine-dextroamphetamine (ADDERALL XR) 30 MG 24 hr capsule, Take 1 capsule (30 mg total) by mouth every morning., Disp: 30 capsule, Rfl: 0    [START ON 08/02/2023] amphetamine-dextroamphetamine (ADDERALL XR) 30 MG 24 hr capsule, Take 1 capsule (30 mg total) by mouth every morning., Disp: 30 capsule, Rfl: 0   [START ON 08/31/2023] amphetamine-dextroamphetamine (ADDERALL XR) 30 MG 24 hr capsule, Take 1 capsule (30 mg total) by mouth every morning., Disp: 30 capsule, Rfl: 0   clonazePAM (KLONOPIN) 0.5 MG tablet, Take 1 tablet (0.5 mg total) by mouth daily as needed., Disp: 30 tablet, Rfl: 5   cyclobenzaprine (FLEXERIL) 5 MG tablet, Take 1 tablet (5 mg total) by mouth 3 (three) times daily as needed for muscle spasms., Disp: 30 tablet, Rfl: 1   naloxone (NARCAN) nasal spray 4 mg/0.1 mL, SMARTSIG:Both Nares (Patient not taking: Reported on 06/27/2022), Disp: , Rfl:    OLANZapine (ZYPREXA) 15 MG tablet, TAKE 1 TABLET(15 MG) BY MOUTH AT BEDTIME, Disp: 30 tablet, Rfl: 11   oxyCODONE-acetaminophen (PERCOCET) 5-325 MG tablet, Take 1 tablet by mouth every 4 (four) hours as needed for severe pain. (Patient not taking: Reported on 06/27/2022),  Disp: 30 tablet, Rfl: 0   terbinafine (LAMISIL) 250 MG tablet, Take 250 mg by mouth daily. (Patient not taking: Reported on 12/29/2022), Disp: , Rfl:    triamcinolone (KENALOG) 0.025 % ointment, Apply 1 application. topically 2 (two) times daily., Disp: 30 g, Rfl: 0   varenicline (CHANTIX) 1 MG tablet, Take 1 tablet by mouth 2 (two) times daily. (Patient not taking: Reported on 07/03/2023), Disp: , Rfl:    zolpidem (AMBIEN CR) 12.5 MG CR tablet, Take 1 tablet (12.5 mg total) by mouth at bedtime as needed., Disp: 30 tablet, Rfl: 5 Medication Side Effects: none  Family Medical/ Social History: Changes? No  MENTAL HEALTH EXAM:  There were no vitals taken for this visit.There is no height or weight on file to calculate BMI.  General Appearance:  Unable to assess  Eye Contact:   Unable to assess  Speech:  Clear and Coherent and Normal Rate  Volume:  Normal  Mood:  Euthymic  Affect:   Unable to assess  Thought  Process:  Goal Directed and Descriptions of Associations: Circumstantial  Orientation:  Full (Time, Place, and Person)  Thought Content: Logical   Suicidal Thoughts:  No  Homicidal Thoughts:  No  Memory:  WNL  Judgement:  Good  Insight:  Good  Psychomotor Activity:   Unable to assess  Concentration:  Concentration: Good and Attention Span: Good  Recall:  Good  Fund of Knowledge: Good  Language: Good  Assets:  Communication Skills Desire for Improvement Financial Resources/Insurance Housing Resilience Transportation  ADL's:  Intact  Cognition: WNL  Prognosis:  Good   PCP follows her labs  DIAGNOSES:    ICD-10-CM   1. Depression, major, recurrent, in partial remission (HCC)  F33.41     2. Generalized anxiety disorder  F41.1     3. Attention deficit hyperactivity disorder (ADHD), predominantly inattentive type  F90.0     4. Insomnia due to other mental disorder  F51.05    F99     5. Polypharmacy  Z79.899     6. Smoker  F17.200     7. Grief  F43.21      Receiving Psychotherapy: No   RECOMMENDATIONS:  PDMP was reviewed.  Last Klonopin filled 06/06/2023.  Ambien filled 06/11/2023.  Adderall filled 06/06/2023.  Gabapentin known to me. I provided 30 minutes of non-face-to-face time during this encounter, including time spent before and after the visit in records review, medical decision making, counseling pertinent to today's visit, and charting.   My condolences in the loss of her friend.  She is doing well with her mental health medications so no changes will be made.  She has been on a benzo and a stimulant for years, we have weaned her down to 1 time daily on the Klonopin.  She still needs both so we will continue both.  Continue Adderall XR 30 mg every morning. Continue Adderall XR 25 mg daily at lunch. Continue BuSpar 30 mg 1 p.o. 3 times daily. Continue Klonopin 0.5 mg 1 p.o. daily as needed. Continue Prozac 40 mg, 2 p.o. daily. Continue gabapentin  400 mg   Per  PCP Continue Lamictal 200 mg, 1 p.o. twice daily. Continue Zyprexa 15 mg, nightly. Continue trazodone 50 mg, 1-2 nightly as needed sleep. PCP Rx Continue Ambien CR 12.5 mg p.o. nightly as needed sleep. Labs are followed closely by her primary provider. Return in 6 months.  Melony Overly, PA-C

## 2023-07-30 ENCOUNTER — Ambulatory Visit (INDEPENDENT_AMBULATORY_CARE_PROVIDER_SITE_OTHER): Payer: BC Managed Care – PPO | Admitting: Podiatry

## 2023-07-30 DIAGNOSIS — M24575 Contracture, left foot: Secondary | ICD-10-CM | POA: Diagnosis not present

## 2023-07-30 DIAGNOSIS — M24576 Contracture, unspecified foot: Secondary | ICD-10-CM

## 2023-07-30 NOTE — Patient Instructions (Signed)
Leave bandage in place and dry for 4 days, then remove. You may wash foot normally after removal of bandage. DO NOT SOAK FOOT! Dry completely afterwards and may use a bandaid over incision if needed. We will follow up with you in 1 weeks for recheck.  

## 2023-07-30 NOTE — Progress Notes (Signed)
She presents today for her flexible digital deformities #2 #3 #4 of the left foot.  She denies fever chills nausea vomiting muscle aches and pains states that the ends of the toes are painful when she walks.  Objective: Vital signs stable alert oriented x 3 pulses are palpable.  Capillary fill time to toes 2 through 4 of the left foot are immediate.  She has some contracture deformity PIPJ second digit left consistent with osteoarthritis but is still some flexible so hopefully we can get enough release there that it would keep her from walking on the end of those toes.  Assessment: Hammertoe deformities flexible in nature 234 left.  Plan: After local anesthetic was administered small flexor tenotomy's were performed at the level of the PIPJ with the use of a 18-gauge needle the area was flushed with normal sterile saline once the tendon was released and then a dry sterile compressive dressing was applied.  I will follow-up with her in 2 to 3 weeks to reevaluate she is placed in a Darco shoe she is to keep this dry for the next 3 to 4 days.

## 2023-07-31 ENCOUNTER — Other Ambulatory Visit: Payer: Self-pay | Admitting: Podiatry

## 2023-07-31 ENCOUNTER — Encounter: Payer: Self-pay | Admitting: Podiatry

## 2023-07-31 MED ORDER — OXYCODONE-ACETAMINOPHEN 10-325 MG PO TABS
1.0000 | ORAL_TABLET | Freq: Three times a day (TID) | ORAL | 0 refills | Status: AC | PRN
Start: 2023-07-31 — End: 2023-08-07

## 2023-08-08 ENCOUNTER — Ambulatory Visit: Payer: Medicare Other | Admitting: Podiatry

## 2023-09-27 ENCOUNTER — Telehealth: Payer: Self-pay | Admitting: Physician Assistant

## 2023-09-27 ENCOUNTER — Other Ambulatory Visit: Payer: Self-pay

## 2023-09-27 MED ORDER — AMPHETAMINE-DEXTROAMPHET ER 25 MG PO CP24: 25.0000 mg | ORAL_CAPSULE | Freq: Every day | ORAL | 0 refills | Status: DC

## 2023-09-27 MED ORDER — AMPHETAMINE-DEXTROAMPHET ER 30 MG PO CP24: 30.0000 mg | ORAL_CAPSULE | ORAL | 0 refills | Status: DC

## 2023-09-27 NOTE — Telephone Encounter (Signed)
 Patient called in for refill on Adderall 30mg  and Adderall 25mg . Ph: [phone] Appt 4/2 Pharmacy [facility] [address] [place], [place]

## 2023-09-27 NOTE — Telephone Encounter (Signed)
 Pended 25 mg and 30 mg xr; 25 mg to pu tomorrow, and 30 mg to pu Saturday to [facility] pharmacy.

## 2023-10-05 ENCOUNTER — Other Ambulatory Visit: Payer: Self-pay

## 2023-10-05 ENCOUNTER — Telehealth: Payer: Self-pay | Admitting: Physician Assistant

## 2023-10-05 NOTE — Telephone Encounter (Signed)
 Next appt is 12/26/23. Requesting refill on Adderall 30 mg called to:  [facility], [address], [place], [place] [address]. Phone number is [phone].

## 2023-10-05 NOTE — Telephone Encounter (Signed)
 Pended 30 mg XR to [facility]

## 2023-10-08 ENCOUNTER — Ambulatory Visit: Payer: Medicare Other | Admitting: Podiatry

## 2023-10-08 ENCOUNTER — Telehealth: Payer: Self-pay

## 2023-10-08 MED ORDER — AMPHETAMINE-DEXTROAMPHET ER 30 MG PO CP24: 30.0000 mg | ORAL_CAPSULE | ORAL | 0 refills | Status: DC

## 2023-10-08 NOTE — Telephone Encounter (Signed)
 Prior Authorization Amphetamine-Dextroamphet ER 30MG  er capsules #30/30 [facility]

## 2023-10-09 NOTE — Telephone Encounter (Signed)
 Prior approval received effective 10/09/2023 until further notice. Approved under the Member's Medicare Part D benefit. 30 units for 30 days. Amphetamine-Dextroamphetamine ER 30 mg.

## 2023-10-22 ENCOUNTER — Ambulatory Visit: Payer: Medicare Other | Admitting: Podiatry

## 2023-10-25 ENCOUNTER — Telehealth: Payer: Self-pay | Admitting: Physician Assistant

## 2023-10-25 ENCOUNTER — Other Ambulatory Visit: Payer: Self-pay

## 2023-10-25 MED ORDER — AMPHETAMINE-DEXTROAMPHET ER 25 MG PO CP24: 25.0000 mg | ORAL_CAPSULE | Freq: Every day | ORAL | 0 refills | Status: DC

## 2023-10-25 NOTE — Telephone Encounter (Signed)
Pt called @ 10:50a for refill of Adderall to   [facility] #[id number] [place], [place] - [address] AT [facility] [address] [facility] [address] [place], [place] [address] Phone: [phone]  Fax: [phone]   Next appt 4/2

## 2023-10-25 NOTE — Telephone Encounter (Signed)
Pended adderall 25 mg to [facility].  Not due until 2/2

## 2023-11-05 ENCOUNTER — Other Ambulatory Visit: Payer: Self-pay

## 2023-11-05 ENCOUNTER — Telehealth: Payer: Self-pay | Admitting: Physician Assistant

## 2023-11-05 NOTE — Telephone Encounter (Signed)
 Pended Adderall  XR30 to [facility] in [place].

## 2023-11-05 NOTE — Telephone Encounter (Signed)
 Katrina Booker called at 2:20 to request refill of her Adderall  30mg .  Appt 4/2.  Send to [id number] - [place], [place] - [address] AT [facility] [address] [facility]

## 2023-11-06 MED ORDER — AMPHETAMINE-DEXTROAMPHET ER 30 MG PO CP24: 30.0000 mg | ORAL_CAPSULE | ORAL | 0 refills | Status: DC

## 2023-11-14 ENCOUNTER — Telehealth: Payer: Self-pay

## 2023-11-14 NOTE — Telephone Encounter (Signed)
Prior Authorization initiated with [facility] Medicare for Zolpidem ER 12.5 mg, pending response

## 2023-11-15 NOTE — Telephone Encounter (Signed)
Prior approval received effective 10/28/2023-09/24/2098 with [facility] Medicare

## 2023-11-22 ENCOUNTER — Telehealth: Payer: Self-pay | Admitting: Physician Assistant

## 2023-11-22 NOTE — Telephone Encounter (Signed)
 Katrina Booker called at 3:00 to request refill of her Adderall 25mg . Appt 4/2.  Send to     [id number] - [place], [place] - [address] AT [facility] OF [address] [facility]

## 2023-11-22 NOTE — Telephone Encounter (Signed)
 LF 2/3, due 3/3

## 2023-11-26 ENCOUNTER — Telehealth: Payer: Self-pay

## 2023-11-26 ENCOUNTER — Other Ambulatory Visit: Payer: Self-pay

## 2023-11-26 NOTE — Telephone Encounter (Signed)
 Patient called in for refill on Adderall 25mg . Ph: [phone] appt 4/2 Pharmacy [facility] [address] [place], [place]

## 2023-11-26 NOTE — Telephone Encounter (Signed)
 Pended Adderall XR 25 to [facility] in [place]

## 2023-11-27 ENCOUNTER — Telehealth: Payer: Self-pay | Admitting: Physician Assistant

## 2023-11-27 ENCOUNTER — Other Ambulatory Visit: Payer: Self-pay

## 2023-11-27 MED ORDER — AMPHETAMINE-DEXTROAMPHET ER 25 MG PO CP24
25.0000 mg | ORAL_CAPSULE | Freq: Every day | ORAL | 0 refills | Status: DC
Start: 2023-12-25 — End: 2023-12-26

## 2023-11-27 MED ORDER — AMPHETAMINE-DEXTROAMPHET ER 25 MG PO CP24: 25.0000 mg | ORAL_CAPSULE | Freq: Every day | ORAL | 0 refills | Status: DC

## 2023-11-27 NOTE — Telephone Encounter (Signed)
 Pt just called and said that  the [facility] in [place] doesn't have the adderall in stock. Please cancel and [facility] [address] in [place]

## 2023-11-27 NOTE — Telephone Encounter (Signed)
 Need to cancel at [facility]. [phone]   Repended Adderall 25 mg to [facility].

## 2023-11-28 NOTE — Telephone Encounter (Signed)
Canceled at [facility].  

## 2023-12-04 ENCOUNTER — Telehealth: Payer: Self-pay | Admitting: Physician Assistant

## 2023-12-04 ENCOUNTER — Other Ambulatory Visit: Payer: Self-pay

## 2023-12-04 NOTE — Telephone Encounter (Signed)
 Pharmacy [facility] [address] [place]

## 2023-12-04 NOTE — Telephone Encounter (Signed)
 Patient called in for refill on Adderall 30mg . Ph: [phone] Appt 4/2 Pharmacy

## 2023-12-04 NOTE — Telephone Encounter (Signed)
 Pended Adderall 30 XR to [facility] in [place]

## 2023-12-05 MED ORDER — AMPHETAMINE-DEXTROAMPHET ER 30 MG PO CP24: 30.0000 mg | ORAL_CAPSULE | ORAL | 0 refills | Status: DC

## 2023-12-10 DIAGNOSIS — E782 Mixed hyperlipidemia: Secondary | ICD-10-CM | POA: Insufficient documentation

## 2023-12-26 ENCOUNTER — Ambulatory Visit: Payer: Medicare Other | Admitting: Physician Assistant

## 2023-12-26 ENCOUNTER — Telehealth: Payer: Self-pay | Admitting: Physician Assistant

## 2023-12-26 ENCOUNTER — Encounter: Payer: Self-pay | Admitting: Physician Assistant

## 2023-12-26 DIAGNOSIS — F900 Attention-deficit hyperactivity disorder, predominantly inattentive type: Secondary | ICD-10-CM

## 2023-12-26 DIAGNOSIS — F3341 Major depressive disorder, recurrent, in partial remission: Secondary | ICD-10-CM

## 2023-12-26 DIAGNOSIS — F5105 Insomnia due to other mental disorder: Secondary | ICD-10-CM | POA: Diagnosis not present

## 2023-12-26 DIAGNOSIS — F411 Generalized anxiety disorder: Secondary | ICD-10-CM

## 2023-12-26 DIAGNOSIS — F99 Mental disorder, not otherwise specified: Secondary | ICD-10-CM

## 2023-12-26 MED ORDER — ZOLPIDEM TARTRATE ER 12.5 MG PO TBCR: 12.5000 mg | EXTENDED_RELEASE_TABLET | Freq: Every evening | ORAL | 5 refills | Status: DC | PRN

## 2023-12-26 MED ORDER — AMPHETAMINE-DEXTROAMPHET ER 25 MG PO CP24: 25.0000 mg | ORAL_CAPSULE | Freq: Every day | ORAL | 0 refills | Status: DC

## 2023-12-26 MED ORDER — FLUOXETINE HCL 40 MG PO CAPS: 80.0000 mg | ORAL_CAPSULE | Freq: Every day | ORAL | 11 refills | Status: DC

## 2023-12-26 MED ORDER — OLANZAPINE 15 MG PO TABS: ORAL_TABLET | ORAL | 11 refills | Status: DC

## 2023-12-26 MED ORDER — TRAZODONE HCL 50 MG PO TABS: 100.0000 mg | ORAL_TABLET | Freq: Every day | ORAL | 11 refills | Status: AC

## 2023-12-26 MED ORDER — AMPHETAMINE-DEXTROAMPHET ER 30 MG PO CP24: 30.0000 mg | ORAL_CAPSULE | ORAL | 0 refills | Status: DC

## 2023-12-26 MED ORDER — BUSPIRONE HCL 30 MG PO TABS: ORAL_TABLET | ORAL | 11 refills | Status: DC

## 2023-12-26 MED ORDER — CLONAZEPAM 0.5 MG PO TABS: 0.5000 mg | ORAL_TABLET | Freq: Every day | ORAL | 5 refills | Status: DC | PRN

## 2023-12-26 NOTE — Telephone Encounter (Signed)
 Pt was here today for visit.  Adderall is not available at pharmacy originally requested.  Pls send to [facility] in [place].  Next appt 10/3

## 2023-12-26 NOTE — Progress Notes (Unsigned)
 [facility]  Patient ID: Katrina Booker,  MRN: [id number]  PCP: Dorothey Baseman, MD  Date of Evaluation: 12/28/2023 Time spent:35 minutes  Chief Complaint:  Chief Complaint   Anxiety; ADD; Depression; Insomnia; Follow-up    HISTORY/CURRENT STATUS: HPI for routine 6 month med check.  Katrina Booker is doing well.  She feels like all of her medications are working as they should. 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 States that attention is good without easy distractibility.  Able to focus on things and finish tasks to completion.   Patient is able to enjoy things.  Energy and motivation are good.  Not working.  No extreme sadness, tearfulness, or feelings of hopelessness.  Sleeps well but has to take the Ambien.  ADLs and personal hygiene are normal.   No change in memory.  Appetite has not changed.  Weight is stable.  Anxiety is controlled.  She has been on Klonopin for years, along with Adderall.  The dose has been decreased but she has been unable to completely stop it.  It is effective.  Not having panic attacks but gets overwhelmed easily.  Denies suicidal or homicidal thoughts.  Patient denies increased energy with decreased need for sleep, increased talkativeness, racing thoughts, impulsivity or risky behaviors, increased spending, increased libido, grandiosity, increased irritability or anger, paranoia, or hallucinations.  Denies dizziness, syncope, seizures, numbness, tingling, tremor, tics, unsteady gait, slurred speech, confusion. Denies muscle or joint pain, stiffness, or dystonia.  Individual Medical History/ Review of Systems: Changes? :No     Past medications for mental health diagnoses include: Chantix, Lamictal, BuSpar, symbyax, Zyprexa, Xanax, Latuda, Effexor, Sonata, Ambien, Prozac, Librium, Wellbutrin, Adderall XR, Cymbalta, Hydroxyzine, Klonopin, Zoloft  Allergies: Betadine [povidone iodine], Ketorolac, Amlodipine, Hydrocodone, and Tramadol  Current  Medications:  Current Outpatient Medications:    albuterol (PROVENTIL HFA;VENTOLIN HFA) 108 (90 BASE) MCG/ACT inhaler, Inhale 2 puffs into the lungs every 6 (six) hours as needed. For ashtma, Disp: , Rfl:    aspirin EC 81 MG tablet, Take 81 mg by mouth daily. Swallow whole., Disp: , Rfl:    atorvastatin (LIPITOR) 40 MG tablet, Take 40 mg by mouth daily., Disp: , Rfl:    fluticasone (FLONASE) 50 MCG/ACT nasal spray, Place into both nostrils., Disp: , Rfl:    gabapentin (NEURONTIN) 400 MG capsule, Take 400 mg by mouth. Up to qid prn, Disp: , Rfl:    lamoTRIgine (LAMICTAL) 200 MG tablet, Take 200 mg by mouth 2 (two) times daily., Disp: , Rfl:    lisinopril-hydrochlorothiazide (ZESTORETIC) 20-12.5 MG tablet, Take 1 tablet by mouth every morning., Disp: , Rfl:    metFORMIN (GLUCOPHAGE) 500 MG tablet, Take 500 mg by mouth 2 (two) times daily with a meal., Disp: , Rfl:    Omega-3 1000 MG CAPS, Take by mouth., Disp: , Rfl:    ondansetron (ZOFRAN) 4 MG tablet, TAKE 1 TABLET(4 MG) BY MOUTH EVERY 8 HOURS AS NEEDED, Disp: 30 tablet, Rfl: 2   potassium chloride (K-DUR) 10 MEQ tablet, TAKE 1 TABLET(10 MEQ) BY MOUTH EVERY DAY, Disp: , Rfl:    promethazine (PHENERGAN) 25 MG tablet, TAKE 1 TABLET BY MOUTH EVERY 4 HOURS AS NEEDED FOR NAUSEA, Disp: , Rfl:    propranolol (INDERAL) 10 MG tablet, Take by mouth. Up to 5 qd., Disp: , Rfl:    SUMAtriptan (IMITREX) 100 MG tablet, Take by mouth., Disp: , Rfl:    XIIDRA 5 % SOLN, , Disp: , Rfl:    [START ON 02/02/2024] amphetamine-dextroamphetamine (ADDERALL  XR) 25 MG 24 hr capsule, Take 1 capsule by mouth daily with lunch., Disp: 30 capsule, Rfl: 0   [START ON 03/03/2024] amphetamine-dextroamphetamine (ADDERALL XR) 25 MG 24 hr capsule, Take 1 capsule by mouth daily with lunch., Disp: 30 capsule, Rfl: 0   amphetamine-dextroamphetamine (ADDERALL XR) 25 MG 24 hr capsule, Take 1 capsule by mouth daily with lunch., Disp: 30 capsule, Rfl: 0   [START ON 01/04/2024]  amphetamine-dextroamphetamine (ADDERALL XR) 30 MG 24 hr capsule, Take 1 capsule (30 mg total) by mouth every morning., Disp: 30 capsule, Rfl: 0   [START ON 02/02/2024] amphetamine-dextroamphetamine (ADDERALL XR) 30 MG 24 hr capsule, Take 1 capsule (30 mg total) by mouth every morning., Disp: 30 capsule, Rfl: 0   [START ON 03/03/2024] amphetamine-dextroamphetamine (ADDERALL XR) 30 MG 24 hr capsule, Take 1 capsule (30 mg total) by mouth every morning., Disp: 30 capsule, Rfl: 0   busPIRone (BUSPAR) 30 MG tablet, TAKE 1 TABLET(30 MG) BY MOUTH THREE TIMES DAILY, Disp: 90 tablet, Rfl: 11   clonazePAM (KLONOPIN) 0.5 MG tablet, Take 1 tablet (0.5 mg total) by mouth daily as needed., Disp: 30 tablet, Rfl: 5   cyclobenzaprine (FLEXERIL) 5 MG tablet, Take 1 tablet (5 mg total) by mouth 3 (three) times daily as needed for muscle spasms., Disp: 30 tablet, Rfl: 1   Dulaglutide (TRULICITY) 0.75 MG/0.5ML SOPN, ADMINISTER 0.75 MG UNDER THE SKIN 1 TIME A WEEK (Patient not taking: Reported on 12/26/2023), Disp: , Rfl:    fexofenadine (ALLEGRA) 180 MG tablet, Take 180 mg by mouth daily.  (Patient not taking: Reported on 12/26/2023), Disp: , Rfl:    FLUoxetine (PROZAC) 40 MG capsule, Take 2 capsules (80 mg total) by mouth daily., Disp: 60 capsule, Rfl: 11   naloxone (NARCAN) nasal spray 4 mg/0.1 mL, SMARTSIG:Both Nares (Patient not taking: Reported on 06/27/2022), Disp: , Rfl:    OLANZapine (ZYPREXA) 15 MG tablet, TAKE 1 TABLET(15 MG) BY MOUTH AT BEDTIME, Disp: 30 tablet, Rfl: 11   oxyCODONE-acetaminophen (PERCOCET) 5-325 MG tablet, Take 1 tablet by mouth every 4 (four) hours as needed for severe pain. (Patient not taking: Reported on 06/27/2022), Disp: 30 tablet, Rfl: 0   OZEMPIC, 0.25 OR 0.5 MG/DOSE, 2 MG/3ML SOPN, Inject 0.5 mg into the skin once a week., Disp: , Rfl:    terbinafine (LAMISIL) 250 MG tablet, Take 250 mg by mouth daily. (Patient not taking: Reported on 12/29/2022), Disp: , Rfl:    traZODone (DESYREL) 50 MG tablet,  Take 2 tablets (100 mg total) by mouth at bedtime., Disp: 60 tablet, Rfl: 11   triamcinolone (KENALOG) 0.025 % ointment, Apply 1 application. topically 2 (two) times daily., Disp: 30 g, Rfl: 0   varenicline (CHANTIX) 1 MG tablet, Take 1 tablet by mouth 2 (two) times daily. (Patient not taking: Reported on 12/26/2023), Disp: , Rfl:    zolpidem (AMBIEN CR) 12.5 MG CR tablet, Take 1 tablet (12.5 mg total) by mouth at bedtime as needed., Disp: 30 tablet, Rfl: 5 Medication Side Effects: none  Family Medical/ Social History: Changes? No  MENTAL HEALTH EXAM:  There were no vitals taken for this visit.There is no height or weight on file to calculate BMI.  General Appearance: Casual, Well Groomed, and poor dentition  Eye Contact:  Good  Speech:  Clear and Coherent and Normal Rate  Volume:  Normal  Mood:  Euthymic  Affect:  Congruent  Thought Process:  Goal Directed and Descriptions of Associations: Circumstantial  Orientation:  Full (Time, Place, and Person)  Thought Content: Logical   Suicidal Thoughts:  No  Homicidal Thoughts:  No  Memory:  WNL  Judgement:  Good  Insight:  Good  Psychomotor Activity:  Normal  Concentration:  Concentration: Good and Attention Span: Good  Recall:  Good  Fund of Knowledge: Good  Language: Good  Assets:  Communication Skills Desire for Improvement Financial Resources/Insurance Housing Resilience Transportation  ADL's:  Intact  Cognition: WNL  Prognosis:  Good   PCP follows her labs  DIAGNOSES:    ICD-10-CM   1. Generalized anxiety disorder  F41.1     2. Attention deficit hyperactivity disorder (ADHD), predominantly inattentive type  F90.0     3. Depression, major, recurrent, in partial remission (HCC)  F33.41     4. Insomnia due to other mental disorder  F51.05    F99      Receiving Psychotherapy: No   RECOMMENDATIONS:  PDMP was reviewed.  Last Ambien filled 12/15/2023.  Gabapentin filled 12/07/2023.  Adderall filled 12/05/2023.  Klonopin  filled 11/26/2023. I provided 35 minutes of face to face time during this encounter, including time spent before and after the visit in records review, medical decision making, counseling pertinent to today's visit, and charting.   She is doing well so no changes need to be made.  Approximately 17 minutes were spent in counseling concerning sleep hygiene.  Also smoking cessation.  Continue Adderall XR 30 mg every morning. Continue Adderall XR 25 mg daily at lunch. Continue BuSpar 30 mg 1 p.o. 3 times daily. Continue Klonopin 0.5 mg 1 p.o. daily as needed. Continue Prozac 40 mg, 2 p.o. daily. Continue gabapentin  400 mg   Per PCP Continue Lamictal 200 mg, 1 p.o. twice daily. Continue Zyprexa 15 mg, nightly. Continue trazodone 50 mg, 1-2 nightly as needed sleep. PCP Rx Continue Ambien CR 12.5 mg p.o. nightly as needed sleep. Return in 6 months.  Melony Overly, PA-C

## 2023-12-27 ENCOUNTER — Other Ambulatory Visit: Payer: Self-pay

## 2023-12-27 NOTE — Telephone Encounter (Signed)
 Patient notified that Rx had been sent to provider.

## 2023-12-27 NOTE — Telephone Encounter (Signed)
 Canceled at [facility] and repended to [facility].

## 2023-12-27 NOTE — Telephone Encounter (Signed)
 Pt called back today at 4:04p about the status of the refill to [facility].  I didn't specify yesterday but it was the Adderall XR 25mg  that she said is due now.  She said she got off of getting them on the same day one time when it was out of stock somewhere else.

## 2023-12-28 MED ORDER — AMPHETAMINE-DEXTROAMPHET ER 25 MG PO CP24: 25.0000 mg | ORAL_CAPSULE | Freq: Every day | ORAL | 0 refills | Status: DC

## 2024-01-25 ENCOUNTER — Telehealth: Payer: Self-pay | Admitting: Physician Assistant

## 2024-01-25 ENCOUNTER — Other Ambulatory Visit: Payer: Self-pay

## 2024-01-25 MED ORDER — AMPHETAMINE-DEXTROAMPHET ER 25 MG PO CP24: 25.0000 mg | ORAL_CAPSULE | Freq: Every day | ORAL | 0 refills | Status: DC

## 2024-01-25 NOTE — Telephone Encounter (Signed)
 Pt called and said that she got her adderall  xr 25 mg on April 4th. She needs the may script to be changed to 5/4 instead of 5/11. That would mean she would 8 days without her medicine

## 2024-01-25 NOTE — Telephone Encounter (Signed)
 Corrected fill date and repended.

## 2024-02-20 ENCOUNTER — Telehealth: Payer: Self-pay | Admitting: Physician Assistant

## 2024-02-20 NOTE — Telephone Encounter (Signed)
 Patient called in for refill on Adderall  25mg . Ph: [phone] Appt 10/3 Pharmacy [facility] [address] [place], [place]

## 2024-02-20 NOTE — Telephone Encounter (Signed)
 LF 5/4, due 6/2

## 2024-02-25 ENCOUNTER — Other Ambulatory Visit: Payer: Self-pay

## 2024-02-25 MED ORDER — AMPHETAMINE-DEXTROAMPHET ER 25 MG PO CP24: 25.0000 mg | ORAL_CAPSULE | Freq: Every day | ORAL | 0 refills | Status: DC

## 2024-02-25 NOTE — Telephone Encounter (Signed)
 Pended Adderall  XR 25

## 2024-03-24 ENCOUNTER — Other Ambulatory Visit: Payer: Self-pay

## 2024-03-24 ENCOUNTER — Telehealth: Payer: Self-pay | Admitting: Physician Assistant

## 2024-03-24 NOTE — Telephone Encounter (Signed)
 Pended Adderall  XR 25 to [facility] in [place].  Adderall  XR 30 LF 6/12, due 7/10

## 2024-03-24 NOTE — Telephone Encounter (Signed)
 03/06/2024 12/26/2023 2  Dextroamp-Amphet Er 30 Mg Cap 30.00 30 [place] [id number] [facility] ([id number]) 0/0  [employer] 02/25/2024 02/25/2024 2  Dextroamp-Amphet Er 25 Mg Cap 30.00

## 2024-03-24 NOTE — Telephone Encounter (Signed)
 Patient called in for refill on Adderall  XR 25mg  and Adderall  30mg . Ph: [phone] Appt 10/3 Pharmacy [facility] [address] [place], [place]

## 2024-03-25 MED ORDER — AMPHETAMINE-DEXTROAMPHET ER 25 MG PO CP24: 25.0000 mg | ORAL_CAPSULE | Freq: Every day | ORAL | 0 refills | Status: DC

## 2024-04-03 ENCOUNTER — Other Ambulatory Visit: Payer: Self-pay

## 2024-04-03 NOTE — Telephone Encounter (Signed)
 Due 7/11, not 7/10

## 2024-04-03 NOTE — Telephone Encounter (Signed)
 Pended Adderall  XR30 to [facility] in [place].

## 2024-04-04 MED ORDER — AMPHETAMINE-DEXTROAMPHET ER 30 MG PO CP24: 30.0000 mg | ORAL_CAPSULE | ORAL | 0 refills | Status: DC

## 2024-04-21 ENCOUNTER — Telehealth: Payer: Self-pay | Admitting: Physician Assistant

## 2024-04-21 NOTE — Telephone Encounter (Signed)
 Patient called in for refill on Adderall  XR 25mg . Ph: [phone] appt 10/3 Pharmacy [facility] [address] [place]

## 2024-04-22 ENCOUNTER — Other Ambulatory Visit: Payer: Self-pay

## 2024-04-23 ENCOUNTER — Other Ambulatory Visit: Payer: Self-pay

## 2024-04-23 MED ORDER — AMPHETAMINE-DEXTROAMPHET ER 30 MG PO CP24
30.0000 mg | ORAL_CAPSULE | ORAL | 0 refills | Status: DC
Start: 2024-06-19 — End: 2024-07-17

## 2024-04-23 MED ORDER — AMPHETAMINE-DEXTROAMPHET ER 25 MG PO CP24: 25.0000 mg | ORAL_CAPSULE | Freq: Every day | ORAL | 0 refills | Status: DC

## 2024-04-23 MED ORDER — AMPHETAMINE-DEXTROAMPHET ER 25 MG PO CP24
25.0000 mg | ORAL_CAPSULE | Freq: Every day | ORAL | 0 refills | Status: DC
Start: 2024-05-22 — End: 2024-07-17

## 2024-04-23 NOTE — Telephone Encounter (Signed)
 Pended.

## 2024-06-05 ENCOUNTER — Other Ambulatory Visit: Payer: Self-pay | Admitting: Physician Assistant

## 2024-06-09 ENCOUNTER — Other Ambulatory Visit: Payer: Self-pay | Admitting: Physician Assistant

## 2024-06-10 NOTE — Telephone Encounter (Signed)
 LF 8/25, too early to fill.

## 2024-06-13 ENCOUNTER — Telehealth: Payer: Self-pay | Admitting: Physician Assistant

## 2024-06-13 NOTE — Telephone Encounter (Signed)
 Pt is on 2 different doses of Adderall  - 30 XR and 25 XR. LF 30 XR 9/8, LF 25 XR 8/27, due 9/24. LF clonazepam  8/25, due 9/23.

## 2024-06-13 NOTE — Telephone Encounter (Signed)
 Pt needs rf of Clonazepam  0.25mg  and Adderall  25mg . Looks like 2 scripts for Adderall  30 mg were put in for Sept.   Appt 10/3  [facility] [address]

## 2024-06-17 ENCOUNTER — Other Ambulatory Visit: Payer: Self-pay

## 2024-06-17 DIAGNOSIS — F411 Generalized anxiety disorder: Secondary | ICD-10-CM

## 2024-06-17 MED ORDER — CLONAZEPAM 0.5 MG PO TABS: 0.5000 mg | ORAL_TABLET | Freq: Every day | ORAL | 5 refills | Status: DC | PRN

## 2024-06-17 NOTE — Telephone Encounter (Signed)
 Pended clonazepam  9/23

## 2024-06-19 ENCOUNTER — Other Ambulatory Visit: Payer: Self-pay

## 2024-06-19 DIAGNOSIS — F900 Attention-deficit hyperactivity disorder, predominantly inattentive type: Secondary | ICD-10-CM

## 2024-06-19 MED ORDER — AMPHETAMINE-DEXTROAMPHET ER 25 MG PO CP24
25.0000 mg | ORAL_CAPSULE | Freq: Every day | ORAL | 0 refills | Status: DC
Start: 2024-06-19 — End: 2024-07-17

## 2024-06-19 NOTE — Telephone Encounter (Signed)
 Pended Adderall  25 XR

## 2024-06-27 ENCOUNTER — Ambulatory Visit (INDEPENDENT_AMBULATORY_CARE_PROVIDER_SITE_OTHER): Admitting: Physician Assistant

## 2024-06-27 NOTE — Progress Notes (Signed)
 No show

## 2024-07-01 ENCOUNTER — Other Ambulatory Visit: Payer: Self-pay | Admitting: Medical Genetics

## 2024-07-03 ENCOUNTER — Other Ambulatory Visit: Payer: Self-pay | Admitting: Physician Assistant

## 2024-07-03 DIAGNOSIS — F5105 Insomnia due to other mental disorder: Secondary | ICD-10-CM

## 2024-07-10 ENCOUNTER — Other Ambulatory Visit: Payer: Self-pay | Admitting: Physician Assistant

## 2024-07-11 ENCOUNTER — Telehealth: Payer: Self-pay | Admitting: Physician Assistant

## 2024-07-11 ENCOUNTER — Other Ambulatory Visit: Payer: Self-pay

## 2024-07-11 DIAGNOSIS — F5105 Insomnia due to other mental disorder: Secondary | ICD-10-CM

## 2024-07-11 MED ORDER — ZOLPIDEM TARTRATE ER 12.5 MG PO TBCR: 12.5000 mg | EXTENDED_RELEASE_TABLET | Freq: Every evening | ORAL | 0 refills | Status: DC | PRN

## 2024-07-11 NOTE — Telephone Encounter (Signed)
 Adderall  25 LF 9/25, due 10/24. Pended zolpidem .

## 2024-07-11 NOTE — Telephone Encounter (Signed)
 Pt called requesting Rx Zolpidem  12.5 mg & Adderall  XR 25 mg. [place], [place]  Apt 10/20

## 2024-07-14 ENCOUNTER — Ambulatory Visit: Admitting: Physician Assistant

## 2024-07-17 ENCOUNTER — Encounter: Payer: Self-pay | Admitting: Physician Assistant

## 2024-07-17 ENCOUNTER — Ambulatory Visit (INDEPENDENT_AMBULATORY_CARE_PROVIDER_SITE_OTHER): Admitting: Physician Assistant

## 2024-07-17 DIAGNOSIS — F900 Attention-deficit hyperactivity disorder, predominantly inattentive type: Secondary | ICD-10-CM

## 2024-07-17 DIAGNOSIS — F3341 Major depressive disorder, recurrent, in partial remission: Secondary | ICD-10-CM

## 2024-07-17 DIAGNOSIS — Z79899 Other long term (current) drug therapy: Secondary | ICD-10-CM

## 2024-07-17 DIAGNOSIS — F99 Mental disorder, not otherwise specified: Secondary | ICD-10-CM

## 2024-07-17 DIAGNOSIS — F17200 Nicotine dependence, unspecified, uncomplicated: Secondary | ICD-10-CM | POA: Diagnosis not present

## 2024-07-17 DIAGNOSIS — F411 Generalized anxiety disorder: Secondary | ICD-10-CM

## 2024-07-17 DIAGNOSIS — F5105 Insomnia due to other mental disorder: Secondary | ICD-10-CM

## 2024-07-17 MED ORDER — AMPHETAMINE-DEXTROAMPHET ER 30 MG PO CP24: 30.0000 mg | ORAL_CAPSULE | ORAL | 0 refills | Status: DC

## 2024-07-17 MED ORDER — VARENICLINE TARTRATE 1 MG PO TABS: 1.0000 mg | ORAL_TABLET | Freq: Two times a day (BID) | ORAL | 1 refills | Status: AC

## 2024-07-17 MED ORDER — VARENICLINE TARTRATE 0.5 MG PO TABS: ORAL_TABLET | ORAL | 0 refills | Status: AC

## 2024-07-17 MED ORDER — AMPHETAMINE-DEXTROAMPHET ER 25 MG PO CP24: 25.0000 mg | ORAL_CAPSULE | Freq: Every day | ORAL | 0 refills | Status: DC

## 2024-07-17 MED ORDER — AMPHETAMINE-DEXTROAMPHET ER 30 MG PO CP24: 30.0000 mg | ORAL_CAPSULE | ORAL | 0 refills | Status: AC

## 2024-07-17 MED ORDER — ZOLPIDEM TARTRATE ER 12.5 MG PO TBCR: 12.5000 mg | EXTENDED_RELEASE_TABLET | Freq: Every evening | ORAL | 5 refills | Status: AC | PRN

## 2024-07-17 MED ORDER — BUSPIRONE HCL 30 MG PO TABS: ORAL_TABLET | ORAL | 11 refills | Status: AC

## 2024-07-17 MED ORDER — AMPHETAMINE-DEXTROAMPHET ER 25 MG PO CP24: 25.0000 mg | ORAL_CAPSULE | Freq: Every day | ORAL | 0 refills | Status: AC

## 2024-07-17 MED ORDER — CLONAZEPAM 0.5 MG PO TABS: 0.5000 mg | ORAL_TABLET | Freq: Every day | ORAL | 5 refills | Status: AC | PRN

## 2024-07-17 MED ORDER — LAMOTRIGINE 200 MG PO TABS: 200.0000 mg | ORAL_TABLET | Freq: Two times a day (BID) | ORAL | 1 refills | Status: AC

## 2024-07-17 NOTE — Progress Notes (Unsigned)
 [facility]  Patient ID: Katrina Booker,  MRN: [id number]  PCP: Glover Lenis, MD  Date of Evaluation: 07/17/2024 Time spent:30 minutes  Chief Complaint:  Chief Complaint   Anxiety; ADD; Depression; Insomnia; Follow-up    HISTORY/CURRENT STATUS: HPI for routine 6 month med check.  She's doing pretty well.  She has had a couple of panic attacks lately after not having 1 for a year or so.  Both times it was caused by something that was sort of silly.  Her dogs were looking at her and for some reason that made her anxious.  Another time a bug flew at her which caused a panic attack.  She is not afraid of bugs so does not know why she reacted that way.  The Klonopin  is helpful when needed.  She really wants to quit smoking.  She has been on Chantix  before with success for a little while.  She had no side effects from it such as increased depression or any suicidal thoughts.  She would like to retry it.  She is able to enjoy things.  Energy and motivation are good. Not working.  Still in school.   No extreme sadness, tearfulness, or feelings of hopelessness.  Sleeps ok with her sleep medications.  ADLs and personal hygiene are normal.  Appetite has not changed.  Weight is stable.   No SI/HI.  Adderall  is still effective. States that attention is good without easy distractibility.  Able to focus on things and finish tasks to completion.   Patient denies increased energy with decreased need for sleep, increased talkativeness, racing thoughts, impulsivity or risky behaviors, increased spending, increased libido, grandiosity, increased irritability or anger, paranoia, or hallucinations.  Individual Medical History/ Review of Systems: Changes? :No     Past medications for mental health diagnoses include: Chantix , Lamictal , BuSpar , symbyax, Zyprexa , Xanax , Latuda, Effexor, Sonata, Ambien , Prozac , Librium, Wellbutrin, Adderall  XR, Cymbalta, Hydroxyzine , Klonopin ,  Zoloft  Allergies: Betadine [povidone iodine], Ketorolac , Amlodipine, Hydrocodone, and Tramadol   Current Medications:  Current Outpatient Medications:    albuterol  (PROVENTIL  HFA;VENTOLIN  HFA) 108 (90 BASE) MCG/ACT inhaler, Inhale 2 puffs into the lungs every 6 (six) hours as needed. For ashtma, Disp: , Rfl:    aspirin EC 81 MG tablet, Take 81 mg by mouth daily. Swallow whole., Disp: , Rfl:    atorvastatin (LIPITOR) 40 MG tablet, Take 40 mg by mouth daily., Disp: , Rfl:    FLUoxetine  (PROZAC ) 40 MG capsule, Take 2 capsules (80 mg total) by mouth daily., Disp: 60 capsule, Rfl: 11   fluticasone (FLONASE) 50 MCG/ACT nasal spray, Place into both nostrils., Disp: , Rfl:    gabapentin (NEURONTIN) 400 MG capsule, Take 400 mg by mouth. Up to qid prn, Disp: , Rfl:    lisinopril -hydrochlorothiazide (ZESTORETIC) 20-12.5 MG tablet, Take 1 tablet by mouth every morning., Disp: , Rfl:    metFORMIN (GLUCOPHAGE) 500 MG tablet, Take 500 mg by mouth 2 (two) times daily with a meal., Disp: , Rfl:    OLANZapine  (ZYPREXA ) 15 MG tablet, TAKE 1 TABLET(15 MG) BY MOUTH AT BEDTIME, Disp: 30 tablet, Rfl: 11   Omega-3 1000 MG CAPS, Take by mouth., Disp: , Rfl:    ondansetron  (ZOFRAN ) 4 MG tablet, TAKE 1 TABLET(4 MG) BY MOUTH EVERY 8 HOURS AS NEEDED, Disp: 30 tablet, Rfl: 2   OZEMPIC, 0.25 OR 0.5 MG/DOSE, 2 MG/3ML SOPN, Inject 0.5 mg into the skin once a week., Disp: , Rfl:    potassium chloride (K-DUR) 10 MEQ tablet,  TAKE 1 TABLET(10 MEQ) BY MOUTH EVERY DAY, Disp: , Rfl:    promethazine (PHENERGAN) 25 MG tablet, TAKE 1 TABLET BY MOUTH EVERY 4 HOURS AS NEEDED FOR NAUSEA, Disp: , Rfl:    propranolol  (INDERAL ) 10 MG tablet, Take by mouth. Up to 5 qd., Disp: , Rfl:    propranolol  ER (INDERAL  LA) 60 MG 24 hr capsule, Take 60 mg by mouth., Disp: , Rfl:    varenicline  (CHANTIX ) 0.5 MG tablet, UAD for starting dose pack, Disp: 60 tablet, Rfl: 0   XIIDRA 5 % SOLN, , Disp: , Rfl:    [START ON 07/30/2024]  amphetamine -dextroamphetamine  (ADDERALL  XR) 25 MG 24 hr capsule, Take 1 capsule by mouth daily with lunch., Disp: 30 capsule, Rfl: 0   [START ON 07/29/2024] amphetamine -dextroamphetamine  (ADDERALL  XR) 25 MG 24 hr capsule, Take 1 capsule by mouth daily with lunch., Disp: 30 capsule, Rfl: 0   [START ON 09/27/2024] amphetamine -dextroamphetamine  (ADDERALL  XR) 25 MG 24 hr capsule, Take 1 capsule by mouth daily with lunch., Disp: 30 capsule, Rfl: 0   [START ON 07/30/2024] amphetamine -dextroamphetamine  (ADDERALL  XR) 30 MG 24 hr capsule, Take 1 capsule (30 mg total) by mouth every morning., Disp: 30 capsule, Rfl: 0   [START ON 08/28/2024] amphetamine -dextroamphetamine  (ADDERALL  XR) 30 MG 24 hr capsule, Take 1 capsule (30 mg total) by mouth every morning., Disp: 30 capsule, Rfl: 0   [START ON 09/27/2024] amphetamine -dextroamphetamine  (ADDERALL  XR) 30 MG 24 hr capsule, Take 1 capsule (30 mg total) by mouth every morning., Disp: 30 capsule, Rfl: 0   busPIRone  (BUSPAR ) 30 MG tablet, TAKE 1 TABLET(30 MG) BY MOUTH THREE TIMES DAILY, Disp: 90 tablet, Rfl: 11   clonazePAM  (KLONOPIN ) 0.5 MG tablet, Take 1 tablet (0.5 mg total) by mouth daily as needed., Disp: 30 tablet, Rfl: 5   Dulaglutide (TRULICITY) 0.75 MG/0.5ML SOPN, ADMINISTER 0.75 MG UNDER THE SKIN 1 TIME A WEEK (Patient not taking: Reported on 12/26/2023), Disp: , Rfl:    fexofenadine (ALLEGRA) 180 MG tablet, Take 180 mg by mouth daily.  (Patient not taking: Reported on 12/26/2023), Disp: , Rfl:    lamoTRIgine  (LAMICTAL ) 200 MG tablet, Take 1 tablet (200 mg total) by mouth 2 (two) times daily., Disp: 180 tablet, Rfl: 1   naloxone (NARCAN) nasal spray 4 mg/0.1 mL, SMARTSIG:Both Nares (Patient not taking: Reported on 06/27/2022), Disp: , Rfl:    SUMAtriptan (IMITREX) 100 MG tablet, Take by mouth. (Patient not taking: Reported on 07/17/2024), Disp: , Rfl:    traZODone  (DESYREL ) 50 MG tablet, Take 2 tablets (100 mg total) by mouth at bedtime. (Patient not taking: Reported on  07/17/2024), Disp: 60 tablet, Rfl: 11   varenicline  (CHANTIX ) 1 MG tablet, Take 1 tablet (1 mg total) by mouth 2 (two) times daily., Disp: 60 tablet, Rfl: 1   zolpidem  (AMBIEN  CR) 12.5 MG CR tablet, Take 1 tablet (12.5 mg total) by mouth at bedtime as needed., Disp: 30 tablet, Rfl: 5 Medication Side Effects: none  Family Medical/ Social History: Changes? No  MENTAL HEALTH EXAM:  There were no vitals taken for this visit.There is no height or weight on file to calculate BMI.  General Appearance: Casual, Well Groomed, and edentulous  Eye Contact:  Good  Speech:  Clear and Coherent and Normal Rate  Volume:  Normal  Mood:  Euthymic  Affect:  Congruent  Thought Process:  Goal Directed and Descriptions of Associations: Circumstantial  Orientation:  Full (Time, Place, and Person)  Thought Content: Logical   Suicidal Thoughts:  No  Homicidal Thoughts:  No  Memory:  WNL  Judgement:  Good  Insight:  Good  Psychomotor Activity:  Normal  Concentration:  Concentration: Good and Attention Span: Good  Recall:  Good  Fund of Knowledge: Good  Language: Good  Assets:  Communication Skills Desire for Improvement Financial Resources/Insurance Housing Resilience Transportation  ADL's:  Intact  Cognition: WNL  Prognosis:  Good   PCP follows her labs  DIAGNOSES:    ICD-10-CM   1. Smoker  F17.200     2. Attention deficit hyperactivity disorder (ADHD), predominantly inattentive type  F90.0 amphetamine -dextroamphetamine  (ADDERALL  XR) 25 MG 24 hr capsule    3. Generalized anxiety disorder  F41.1 clonazePAM  (KLONOPIN ) 0.5 MG tablet    4. Insomnia due to other mental disorder  F51.05 zolpidem  (AMBIEN  CR) 12.5 MG CR tablet   F99     5. Depression, major, recurrent, in partial remission  F33.41     6. Polypharmacy  Z79.899      Receiving Psychotherapy: No   RECOMMENDATIONS:  PDMP was reviewed.  Last Ambien  filled 07/11/2024. Adderall  filled 06/30/2024.  Gabapentin filled 06/25/2024. I  provided approximately 30 minutes of face to face time during this encounter, including time spent before and after the visit in records review, medical decision making, counseling pertinent to today's visit, and charting.   Shakiara is doing well as far as her mental health medications go.    Discussed high balance with her.  Approximately 18 minutes was spent in counseling concerning smoking cessation, pros and cons of Chantix , how to take it and call if she has any worsening of depression or suicidal thoughts.  Sleep hygiene was also discussed.  Continue Adderall  XR 30 mg every morning. Continue Adderall  XR 25 mg daily at lunch. Continue BuSpar  30 mg 1 p.o. 3 times daily. Continue Klonopin  0.5 mg 1 p.o. daily as needed. Continue Prozac  40 mg, 2 p.o. daily. Continue gabapentin  400 mg, 4 times daily as needed.  Per PCP Continue Lamictal  200 mg, 1 p.o. twice daily. Continue Zyprexa  15 mg, nightly. Continue trazodone  50 mg, 1-2 nightly as needed sleep. PCP Rx Chantix , use as directed. Continue Ambien  CR 12.5 mg p.o. nightly as needed sleep. Recommend counseling.  Return in 2 months.   Verneita Cooks, PA-C

## 2024-07-18 ENCOUNTER — Other Ambulatory Visit: Payer: Self-pay

## 2024-07-18 ENCOUNTER — Encounter: Payer: Self-pay | Admitting: Physician Assistant

## 2024-07-18 DIAGNOSIS — F900 Attention-deficit hyperactivity disorder, predominantly inattentive type: Secondary | ICD-10-CM

## 2024-07-18 MED ORDER — AMPHETAMINE-DEXTROAMPHET ER 25 MG PO CP24: 25.0000 mg | ORAL_CAPSULE | Freq: Every day | ORAL | 0 refills | Status: DC

## 2024-07-18 NOTE — Telephone Encounter (Signed)
 Pended

## 2024-07-21 ENCOUNTER — Other Ambulatory Visit
Admission: RE | Admit: 2024-07-21 | Discharge: 2024-07-21 | Disposition: A | Discharge status: Home / Self Care | Payer: Self-pay | Source: Ambulatory Visit | Attending: Medical Genetics | Admitting: Medical Genetics

## 2024-07-22 ENCOUNTER — Ambulatory Visit (INDEPENDENT_AMBULATORY_CARE_PROVIDER_SITE_OTHER): Admitting: Podiatry

## 2024-07-22 ENCOUNTER — Ambulatory Visit (INDEPENDENT_AMBULATORY_CARE_PROVIDER_SITE_OTHER)

## 2024-07-22 ENCOUNTER — Encounter: Payer: Self-pay | Admitting: Podiatry

## 2024-07-22 DIAGNOSIS — T847XXA Infection and inflammatory reaction due to other internal orthopedic prosthetic devices, implants and grafts, initial encounter: Secondary | ICD-10-CM

## 2024-07-22 MED ORDER — OXYCODONE-ACETAMINOPHEN 10-325 MG PO TABS: 1.0000 | ORAL_TABLET | Freq: Three times a day (TID) | ORAL | 0 refills | Status: AC | PRN

## 2024-07-22 MED ORDER — CEPHALEXIN 500 MG PO CAPS: 500.0000 mg | ORAL_CAPSULE | Freq: Two times a day (BID) | ORAL | 1 refills | Status: DC

## 2024-07-23 NOTE — Progress Notes (Signed)
 She presents today chief complaint of pain to the right second digit.  She states that it has been bothering her for about 6 months now she was scared to come in because she knew that I would have to touch the toe.  She denies fever chills nausea vomit muscle aches and pains denies any trauma to the toe.  Objective: Vital signs are stable she is alert and oriented x 3.  Pulses are palpable.  Second digit right foot does demonstrate some mild erythema to the distal aspect of the distal aspect of the nail does have a black spot under it which is most likely the head of the screw showing.  Radiographs taken today demonstrate wear of the distal phalanx which has resulted in some plantarflexion of the DIPJ and erosion of the skin with the head of the screw.  Currently I do not see any signs of osteomyelitis.  Assessment: Painful internal fixation possible infection second digit right foot.  Plan: At this point we consented her for screw removal second digit right foot also prescribed pain medication as well as a 2-week course of Keflex  500 mg twice daily.  I will follow-up with her in the near future for screw removal.  She states that she will be going to [facility] for the next week or so.

## 2024-07-29 LAB — GENECONNECT MOLECULAR SCREEN: Genetic Analysis Overall Interpretation: NEGATIVE

## 2024-07-31 ENCOUNTER — Telehealth: Payer: Self-pay | Admitting: Podiatry

## 2024-07-31 NOTE — Telephone Encounter (Signed)
 Called and scheduled patient for surgery on 08/08/2024. Patient not on blood thinners but is on Ozempic and is aware not to take her dose this Sunday. Patients preferred pharmacy is entered and correct in chart. Patient is aware [facility] will call 24/48 hours prior to surgery with arrival time.

## 2024-08-01 ENCOUNTER — Encounter: Payer: Self-pay | Admitting: Podiatry

## 2024-08-05 MED ORDER — OXYCODONE-ACETAMINOPHEN 10-325 MG PO TABS: 1.0000 | ORAL_TABLET | Freq: Three times a day (TID) | ORAL | 0 refills | Status: AC | PRN

## 2024-08-06 ENCOUNTER — Other Ambulatory Visit: Payer: Self-pay | Admitting: Podiatry

## 2024-08-06 MED ORDER — CEPHALEXIN 500 MG PO CAPS: 500.0000 mg | ORAL_CAPSULE | Freq: Three times a day (TID) | ORAL | 0 refills | Status: DC

## 2024-08-06 MED ORDER — ONDANSETRON HCL 4 MG PO TABS: 4.0000 mg | ORAL_TABLET | Freq: Three times a day (TID) | ORAL | 0 refills | Status: DC | PRN

## 2024-08-06 MED ORDER — OXYCODONE-ACETAMINOPHEN 10-325 MG PO TABS: 1.0000 | ORAL_TABLET | Freq: Three times a day (TID) | ORAL | 0 refills | Status: AC | PRN

## 2024-08-08 ENCOUNTER — Telehealth: Payer: Self-pay | Admitting: Podiatry

## 2024-08-08 DIAGNOSIS — T847XXA Infection and inflammatory reaction due to other internal orthopedic prosthetic devices, implants and grafts, initial encounter: Secondary | ICD-10-CM | POA: Diagnosis not present

## 2024-08-08 MED ORDER — IBUPROFEN 800 MG PO TABS: 800.0000 mg | ORAL_TABLET | Freq: Three times a day (TID) | ORAL | 0 refills | Status: AC | PRN

## 2024-08-08 NOTE — Telephone Encounter (Signed)
 Received call from patient's husband about postop pain issue, returned call and did not answer

## 2024-08-08 NOTE — Telephone Encounter (Signed)
 Spoke with her husband after callback, has not taken boot off feels very tight and compressing took 2 of the Percocet has not helped I advised him to continue to ice and elevate under the Ace wrap and take the boot off while she is resting in bed, Rx ibuprofen  800  milligrams sent to pharmacy utilize this every 8 hours as needed in between doses can add extra strength Tylenol  to dose of Percocet as well.  He will let me know if he has further issues

## 2024-08-13 ENCOUNTER — Ambulatory Visit (INDEPENDENT_AMBULATORY_CARE_PROVIDER_SITE_OTHER): Admitting: Podiatry

## 2024-08-13 DIAGNOSIS — T847XXA Infection and inflammatory reaction due to other internal orthopedic prosthetic devices, implants and grafts, initial encounter: Secondary | ICD-10-CM

## 2024-08-13 MED ORDER — OXYCODONE-ACETAMINOPHEN 10-325 MG PO TABS: 1.0000 | ORAL_TABLET | Freq: Three times a day (TID) | ORAL | 0 refills | Status: AC | PRN

## 2024-08-13 NOTE — Progress Notes (Signed)
 She presents today for her first postop appointment her surgery was August 08, 2024 removal of screw second digit right foot.  Denies fever chills nausea mobic  muscle aches and pains states that she is out of pain medicine.  Objective: Vital signs stable alert oriented x 3.  Dressed her dressing intact was removed demonstrates no erythema edema salines drainage odor sutures are intact.  Margins well coapting.  Assessment: Well-healing surgical toe.  Plan: I redressed today dressed a compressive dressing and encouraged her leave this on keep it clean and dry and elevated for the next 2 weeks.  Refilled her pain medicine.

## 2024-08-27 ENCOUNTER — Ambulatory Visit: Admitting: Podiatry

## 2024-08-27 DIAGNOSIS — T847XXA Infection and inflammatory reaction due to other internal orthopedic prosthetic devices, implants and grafts, initial encounter: Secondary | ICD-10-CM

## 2024-08-27 NOTE — Progress Notes (Signed)
 Presents today date of surgery 08/08/2024 for removal of screw second digit right foot.  She is here today for suture removal.  States that she has really had no pain with it.  Objective: Vital signs are stable alert and oriented x 3 pulses are palpable right.  Sutures are intact mild edema no erythema cellulitis drainage or odor.  Assessment: Well-healing surgical toe second right.  Plan: Sutures removed instructed her not to get it wet until tomorrow.  Placed Coban around the toe.

## 2024-09-15 ENCOUNTER — Ambulatory Visit: Admitting: Podiatry

## 2024-09-15 VITALS — Ht 63.0 in | Wt 200.0 lb

## 2024-09-15 DIAGNOSIS — T847XXA Infection and inflammatory reaction due to other internal orthopedic prosthetic devices, implants and grafts, initial encounter: Secondary | ICD-10-CM

## 2024-09-15 DIAGNOSIS — Z4789 Encounter for other orthopedic aftercare: Secondary | ICD-10-CM

## 2024-09-15 MED ORDER — CEPHALEXIN 500 MG PO CAPS: 500.0000 mg | ORAL_CAPSULE | Freq: Three times a day (TID) | ORAL | 0 refills | Status: AC

## 2024-09-16 ENCOUNTER — Encounter: Payer: Self-pay | Admitting: Podiatry

## 2024-09-16 NOTE — Progress Notes (Signed)
"  °  Subjective:  Patient ID: Katrina Booker, female    DOB: [birth year],  MRN: [id number]  Chief Complaint  Patient presents with   Post-op Follow-up    RM 7 POV #3 DOS 08/08/2024 RT 2ND REMOVAL FIXATION DEEP KWIRE/SCREW. Right 2nd toe is visibly red/irritated, pt states removing the last two remaining sutures. Pt states toe is tender to the touch.      48 y.o. female returns for post-op check.  She states that was doing well until this morning when a small piece of suture that was still visible she pulled it out and has been red since then   Review of Systems: Negative except as noted in the HPI. Denies N/V/F/Ch.   Objective:  There were no vitals filed for this visit. Body mass index is 35.43 kg/m. Constitutional Well developed. Well nourished.  Vascular Foot warm and well perfused. Capillary refill normal to all digits.  Calf is soft and supple, no posterior calf or knee pain, negative Homans' sign  Neurologic Normal speech. Oriented to person, place, and time. Epicritic sensation to light touch grossly present bilaterally.  Dermatologic Her incision is well-healed, there is some redness around the distal tip of the toe  Orthopedic: Tenderness to palpation noted about the surgical site.     Assessment:   1. Internal fixation device (pin, rod, or screw) infection-inflammation, initial encounter    Plan:  Patient was evaluated and treated and all questions answered.  Overall doing fairly well may have his new superficial infection from removal of the sutures at home, I placed her on Keflex  as a precaution discussed should resolve over the next week if not improving call for follow-up visit with  Dr. Verta.  No follow-ups on file.  "

## 2024-09-17 ENCOUNTER — Encounter: Admitting: Podiatry

## 2024-09-22 ENCOUNTER — Encounter: Payer: Self-pay | Admitting: Physician Assistant

## 2024-09-22 ENCOUNTER — Telehealth: Admitting: Physician Assistant

## 2024-09-22 DIAGNOSIS — F17200 Nicotine dependence, unspecified, uncomplicated: Secondary | ICD-10-CM

## 2024-09-22 DIAGNOSIS — Z79899 Other long term (current) drug therapy: Secondary | ICD-10-CM | POA: Diagnosis not present

## 2024-09-22 DIAGNOSIS — F411 Generalized anxiety disorder: Secondary | ICD-10-CM | POA: Diagnosis not present

## 2024-09-22 DIAGNOSIS — F3341 Major depressive disorder, recurrent, in partial remission: Secondary | ICD-10-CM

## 2024-09-22 DIAGNOSIS — F5105 Insomnia due to other mental disorder: Secondary | ICD-10-CM | POA: Diagnosis not present

## 2024-09-22 DIAGNOSIS — F900 Attention-deficit hyperactivity disorder, predominantly inattentive type: Secondary | ICD-10-CM

## 2024-09-22 DIAGNOSIS — F99 Mental disorder, not otherwise specified: Secondary | ICD-10-CM

## 2024-09-22 MED ORDER — AMPHETAMINE-DEXTROAMPHET ER 25 MG PO CP24: 25.0000 mg | ORAL_CAPSULE | Freq: Every day | ORAL | 0 refills | Status: AC

## 2024-09-22 MED ORDER — AMPHETAMINE-DEXTROAMPHET ER 30 MG PO CP24: 30.0000 mg | ORAL_CAPSULE | ORAL | 0 refills | Status: AC

## 2024-09-22 NOTE — Progress Notes (Signed)
 "     [facility] Check  Patient ID: Katrina Booker,  MRN: [id number]  PCP: Glover Lenis, MD  Date of Evaluation: 09/22/2024 Time spent:20 minutes  Chief Complaint:  Chief Complaint   ADD; Anxiety; Depression; Insomnia; Follow-up   Virtual Visit via Telehealth  I connected with patient by a video enabled telemedicine application with their informed consent, and verified patient privacy and that I am speaking with the correct person using two identifiers.  I am private, in my office and the patient is at home.  I discussed the limitations, risks, security and privacy concerns of performing an evaluation and management service by video and the availability of in person appointments. I also discussed with the patient that there may be a patient responsible charge related to this service. The patient expressed understanding and agreed to proceed.   I discussed the assessment and treatment plan with the patient. The patient was provided an opportunity to ask questions and all were answered. The patient agreed with the plan and demonstrated an understanding of the instructions.   The patient was advised to call back or seek an in-person evaluation if the symptoms worsen or if the condition fails to improve as anticipated.  I provided approximately 20 minutes of non-face-to-face time during this encounter.  HISTORY/CURRENT STATUS: HPI for routine 6 month med check.  Doing well as far as her meds go. She is able to enjoy things.  Energy and motivation are good.  Not working, still in school.  No extreme sadness, tearfulness, or feelings of hopelessness.  Sleeps well with the Ambien .  ADLs and personal hygiene are normal.   Adderall  is still effective.  States that attention is good without easy distractibility.  Able to focus on things and finish tasks to completion.   Appetite has not changed.  Anxiety is controlled.  It's gotten better.  Klonopin  helps when needed. She tries not to  take it when she has just taken Adderall .   No SI/HI.  States the Chantix  has just now been approved so she has not even started it yet.  This appointment was to follow-up for that.  Individual Medical History/ Review of Systems: Changes? :Yes     Had surgery on her toe to remove a screw that was coming out.  Also has several breast masses found recently.  Will have another mammogram and US  in January.   Past medications for mental health diagnoses include: Chantix , Lamictal , BuSpar , symbyax, Zyprexa , Xanax , Latuda, Effexor, Sonata, Ambien , Prozac , Librium, Wellbutrin, Adderall  XR, Cymbalta, Hydroxyzine , Klonopin , Zoloft  Allergies: Betadine [povidone iodine], Ketorolac , Amlodipine, Hydrocodone, and Tramadol   Current Medications:  Current Outpatient Medications:    albuterol  (PROVENTIL  HFA;VENTOLIN  HFA) 108 (90 BASE) MCG/ACT inhaler, Inhale 2 puffs into the lungs every 6 (six) hours as needed. For ashtma, Disp: , Rfl:    [START ON 09/27/2024] amphetamine -dextroamphetamine  (ADDERALL  XR) 25 MG 24 hr capsule, Take 1 capsule by mouth daily with lunch., Disp: 30 capsule, Rfl: 0   [START ON 09/27/2024] amphetamine -dextroamphetamine  (ADDERALL  XR) 30 MG 24 hr capsule, Take 1 capsule (30 mg total) by mouth every morning., Disp: 30 capsule, Rfl: 0   aspirin EC 81 MG tablet, Take 81 mg by mouth daily. Swallow whole., Disp: , Rfl:    atorvastatin (LIPITOR) 40 MG tablet, Take 40 mg by mouth daily., Disp: , Rfl:    busPIRone  (BUSPAR ) 30 MG tablet, TAKE 1 TABLET(30 MG) BY MOUTH THREE TIMES DAILY, Disp: 90 tablet, Rfl: 11   clonazePAM  (  KLONOPIN ) 0.5 MG tablet, Take 1 tablet (0.5 mg total) by mouth daily as needed., Disp: 30 tablet, Rfl: 5   FLUoxetine  (PROZAC ) 40 MG capsule, Take 2 capsules (80 mg total) by mouth daily., Disp: 60 capsule, Rfl: 11   fluticasone (FLONASE) 50 MCG/ACT nasal spray, Place into both nostrils., Disp: , Rfl:    gabapentin (NEURONTIN) 400 MG capsule, Take 400 mg by mouth. Up to qid prn,  Disp: , Rfl:    lamoTRIgine  (LAMICTAL ) 200 MG tablet, Take 1 tablet (200 mg total) by mouth 2 (two) times daily., Disp: 180 tablet, Rfl: 1   lisinopril -hydrochlorothiazide (ZESTORETIC) 20-12.5 MG tablet, Take 1 tablet by mouth every morning., Disp: , Rfl:    metFORMIN (GLUCOPHAGE) 500 MG tablet, Take 500 mg by mouth 2 (two) times daily with a meal., Disp: , Rfl:    OLANZapine  (ZYPREXA ) 15 MG tablet, TAKE 1 TABLET(15 MG) BY MOUTH AT BEDTIME, Disp: 30 tablet, Rfl: 11   Omega-3 1000 MG CAPS, Take by mouth., Disp: , Rfl:    omeprazole (PRILOSEC) 20 MG capsule, Take 20 mg by mouth daily., Disp: , Rfl:    ondansetron  (ZOFRAN ) 4 MG tablet, TAKE 1 TABLET(4 MG) BY MOUTH EVERY 8 HOURS AS NEEDED, Disp: 30 tablet, Rfl: 2   ondansetron  (ZOFRAN ) 4 MG tablet, Take 1 tablet (4 mg total) by mouth every 8 (eight) hours as needed., Disp: 20 tablet, Rfl: 0   oxyCODONE -acetaminophen  (PERCOCET) 10-325 MG tablet, Take 1 tablet by mouth every 8 (eight) hours as needed., Disp: , Rfl:    OZEMPIC, 0.25 OR 0.5 MG/DOSE, 2 MG/3ML SOPN, Inject 0.5 mg into the skin once a week., Disp: , Rfl:    potassium chloride (K-DUR) 10 MEQ tablet, TAKE 1 TABLET(10 MEQ) BY MOUTH EVERY DAY, Disp: , Rfl:    potassium chloride (KLOR-CON M) 10 MEQ tablet, Take 10 mEq by mouth daily., Disp: , Rfl:    promethazine (PHENERGAN) 25 MG tablet, TAKE 1 TABLET BY MOUTH EVERY 4 HOURS AS NEEDED FOR NAUSEA, Disp: , Rfl:    propranolol  (INDERAL ) 10 MG tablet, Take by mouth. Up to 5 qd., Disp: , Rfl:    propranolol  ER (INDERAL  LA) 60 MG 24 hr capsule, Take 60 mg by mouth., Disp: , Rfl:    zolpidem  (AMBIEN  CR) 12.5 MG CR tablet, Take 1 tablet (12.5 mg total) by mouth at bedtime as needed., Disp: 30 tablet, Rfl: 5   [START ON 10/12/2024] amphetamine -dextroamphetamine  (ADDERALL  XR) 25 MG 24 hr capsule, Take 1 capsule by mouth daily with lunch., Disp: 30 capsule, Rfl: 0   [START ON 11/11/2024] amphetamine -dextroamphetamine  (ADDERALL  XR) 25 MG 24 hr capsule, Take 1  capsule by mouth daily with lunch., Disp: 30 capsule, Rfl: 0   [START ON 09/27/2024] amphetamine -dextroamphetamine  (ADDERALL  XR) 30 MG 24 hr capsule, Take 1 capsule (30 mg total) by mouth every morning., Disp: 30 capsule, Rfl: 0   [START ON 10/27/2024] amphetamine -dextroamphetamine  (ADDERALL  XR) 30 MG 24 hr capsule, Take 1 capsule (30 mg total) by mouth every morning., Disp: 30 capsule, Rfl: 0   estradiol (CLIMARA - DOSED IN MG/24 HR) 0.0375 mg/24hr patch, 0.0375 mg once a week., Disp: , Rfl:    SUMAtriptan (IMITREX) 100 MG tablet, Take by mouth. (Patient not taking: Reported on 09/15/2024), Disp: , Rfl:    traZODone  (DESYREL ) 50 MG tablet, Take 2 tablets (100 mg total) by mouth at bedtime. (Patient not taking: Reported on 09/15/2024), Disp: 60 tablet, Rfl: 11   varenicline  (CHANTIX ) 0.5 MG tablet, UAD for  starting dose pack, Disp: 60 tablet, Rfl: 0   varenicline  (CHANTIX ) 1 MG tablet, Take 1 tablet (1 mg total) by mouth 2 (two) times daily., Disp: 60 tablet, Rfl: 1   XIIDRA 5 % SOLN, , Disp: , Rfl:  Medication Side Effects: none  Family Medical/ Social History: Changes? No  MENTAL HEALTH EXAM:  There were no vitals taken for this visit.There is no height or weight on file to calculate BMI.  General Appearance: Casual, Well Groomed, and edentulous  Eye Contact:  Good  Speech:  Clear and Coherent and Normal Rate  Volume:  Normal  Mood:  Euthymic  Affect:  Congruent  Thought Process:  Goal Directed and Descriptions of Associations: Circumstantial  Orientation:  Full (Time, Place, and Person)  Thought Content: Logical   Suicidal Thoughts:  No  Homicidal Thoughts:  No  Memory:  WNL  Judgement:  Good  Insight:  Good  Psychomotor Activity:  Normal  Concentration:  Concentration: Good and Attention Span: Good  Recall:  Good  Fund of Knowledge: Good  Language: Good  Assets:  Communication Skills Desire for Improvement Financial Resources/Insurance Housing Leisure  Time Resilience Transportation  ADL's:  Intact  Cognition: WNL  Prognosis:  Good   PCP follows her labs  DIAGNOSES:    ICD-10-CM   1. Generalized anxiety disorder  F41.1     2. Attention deficit hyperactivity disorder (ADHD), predominantly inattentive type  F90.0 amphetamine -dextroamphetamine  (ADDERALL  XR) 25 MG 24 hr capsule    3. Insomnia due to other mental disorder  F51.05    F99     4. Depression, major, recurrent, in partial remission  F33.41     5. Smoker  F17.200     6. Polypharmacy  Z79.899       Receiving Psychotherapy: No   RECOMMENDATIONS:  PDMP was reviewed. Adderall  filled 09/12/2024.  Klonopin  filled 09/10/2024.  Ambien  filled 09/02/2024.  Gabapentin filled 08/31/2024. I provided approximately  20 minutes of non-face-to-face time during this encounter, including time spent before and after the visit in records review, medical decision making, counseling pertinent to today's visit, and charting.   She plans to start the Chantix  tomorrow.  She is aware of the benefits, risks, and side effects and will report them if they occur.  She is on multiple controlled substances and has been for years.  A few years ago we were able to decrease the Klonopin  but because of life stressors and her being in school, no recent attempts have been made.  She understands the risk of tolerance and addiction and accepts those risks.  Discussed the high balance w/ her, she should discuss w/ admin staff when she checks out.   Continue Adderall  XR 30 mg every morning. Continue Adderall  XR 25 mg daily at lunch. Continue BuSpar  30 mg 1 p.o. 3 times daily. Continue Klonopin  0.5 mg 1 p.o. daily as needed. Continue Prozac  40 mg, 2 p.o. daily. Continue gabapentin  400 mg, 4 times daily as needed.  Per PCP Continue Lamictal  200 mg, 1 p.o. twice daily. Continue Zyprexa  15 mg, nightly. Continue trazodone  50 mg, 1-2 nightly as needed sleep.  PCP Rx Chantix , use as directed. Continue Ambien   CR 12.5 mg p.o. nightly as needed sleep. Recommend counseling.  Return in 2 months.   Verneita Cooks, PA-C  "

## 2024-10-13 ENCOUNTER — Other Ambulatory Visit: Payer: Self-pay | Admitting: Podiatry

## 2024-10-25 ENCOUNTER — Other Ambulatory Visit: Payer: Self-pay | Admitting: Physician Assistant

## 2024-10-26 ENCOUNTER — Other Ambulatory Visit: Payer: Self-pay | Admitting: Physician Assistant
